# Patient Record
Sex: Female | Born: 1950 | Hispanic: No | Marital: Married | Smoking: Former smoker
Health system: Southern US, Community
[De-identification: ages and names within clinical notes are randomized; demographics above are authoritative.]

## PROBLEM LIST (undated history)

## (undated) DIAGNOSIS — F0390 Unspecified dementia without behavioral disturbance: Secondary | ICD-10-CM

## (undated) DIAGNOSIS — E785 Hyperlipidemia, unspecified: Secondary | ICD-10-CM

## (undated) HISTORY — DX: Hyperlipidemia, unspecified: E78.5

## (undated) HISTORY — PX: CATARACT EXTRACTION, BILATERAL: SHX1313

## (undated) HISTORY — DX: Unspecified dementia, unspecified severity, without behavioral disturbance, psychotic disturbance, mood disturbance, and anxiety: F03.90

---

## 2013-09-22 DIAGNOSIS — A498 Other bacterial infections of unspecified site: Secondary | ICD-10-CM | POA: Diagnosis present

## 2013-09-22 DIAGNOSIS — J9819 Other pulmonary collapse: Secondary | ICD-10-CM | POA: Diagnosis not present

## 2013-09-22 DIAGNOSIS — R509 Fever, unspecified: Secondary | ICD-10-CM | POA: Diagnosis not present

## 2013-09-22 DIAGNOSIS — R220 Localized swelling, mass and lump, head: Secondary | ICD-10-CM | POA: Diagnosis not present

## 2013-09-22 DIAGNOSIS — F17200 Nicotine dependence, unspecified, uncomplicated: Secondary | ICD-10-CM | POA: Diagnosis present

## 2013-09-22 DIAGNOSIS — G40909 Epilepsy, unspecified, not intractable, without status epilepticus: Secondary | ICD-10-CM | POA: Diagnosis present

## 2013-09-22 DIAGNOSIS — I672 Cerebral atherosclerosis: Secondary | ICD-10-CM | POA: Diagnosis present

## 2013-09-22 DIAGNOSIS — I6789 Other cerebrovascular disease: Secondary | ICD-10-CM | POA: Diagnosis not present

## 2013-09-22 DIAGNOSIS — J690 Pneumonitis due to inhalation of food and vomit: Secondary | ICD-10-CM | POA: Diagnosis present

## 2013-09-22 DIAGNOSIS — G9341 Metabolic encephalopathy: Secondary | ICD-10-CM | POA: Diagnosis present

## 2013-09-22 DIAGNOSIS — R569 Unspecified convulsions: Secondary | ICD-10-CM | POA: Diagnosis not present

## 2013-09-22 DIAGNOSIS — N390 Urinary tract infection, site not specified: Secondary | ICD-10-CM | POA: Diagnosis not present

## 2013-09-22 DIAGNOSIS — R7309 Other abnormal glucose: Secondary | ICD-10-CM | POA: Diagnosis present

## 2013-09-22 DIAGNOSIS — F0150 Vascular dementia without behavioral disturbance: Secondary | ICD-10-CM | POA: Diagnosis not present

## 2013-09-22 DIAGNOSIS — I6350 Cerebral infarction due to unspecified occlusion or stenosis of unspecified cerebral artery: Secondary | ICD-10-CM | POA: Diagnosis not present

## 2013-09-22 DIAGNOSIS — R918 Other nonspecific abnormal finding of lung field: Secondary | ICD-10-CM | POA: Diagnosis not present

## 2013-09-22 DIAGNOSIS — E872 Acidosis, unspecified: Secondary | ICD-10-CM | POA: Diagnosis present

## 2013-09-22 DIAGNOSIS — G9389 Other specified disorders of brain: Secondary | ICD-10-CM | POA: Diagnosis present

## 2013-10-02 DIAGNOSIS — G4700 Insomnia, unspecified: Secondary | ICD-10-CM | POA: Diagnosis not present

## 2013-10-02 DIAGNOSIS — Z9181 History of falling: Secondary | ICD-10-CM | POA: Diagnosis not present

## 2013-10-02 DIAGNOSIS — Z8701 Personal history of pneumonia (recurrent): Secondary | ICD-10-CM | POA: Diagnosis not present

## 2013-10-02 DIAGNOSIS — Z87440 Personal history of urinary (tract) infections: Secondary | ICD-10-CM | POA: Diagnosis not present

## 2013-10-02 DIAGNOSIS — R569 Unspecified convulsions: Secondary | ICD-10-CM | POA: Diagnosis not present

## 2013-10-02 DIAGNOSIS — Z09 Encounter for follow-up examination after completed treatment for conditions other than malignant neoplasm: Secondary | ICD-10-CM | POA: Diagnosis not present

## 2013-10-02 DIAGNOSIS — G35 Multiple sclerosis: Secondary | ICD-10-CM | POA: Diagnosis not present

## 2013-10-02 DIAGNOSIS — G40909 Epilepsy, unspecified, not intractable, without status epilepticus: Secondary | ICD-10-CM | POA: Diagnosis not present

## 2013-10-02 DIAGNOSIS — F0390 Unspecified dementia without behavioral disturbance: Secondary | ICD-10-CM | POA: Diagnosis not present

## 2013-10-02 DIAGNOSIS — Z87891 Personal history of nicotine dependence: Secondary | ICD-10-CM | POA: Diagnosis not present

## 2013-10-08 DIAGNOSIS — G40909 Epilepsy, unspecified, not intractable, without status epilepticus: Secondary | ICD-10-CM | POA: Diagnosis not present

## 2013-10-08 DIAGNOSIS — F0390 Unspecified dementia without behavioral disturbance: Secondary | ICD-10-CM | POA: Diagnosis not present

## 2013-10-08 DIAGNOSIS — F17200 Nicotine dependence, unspecified, uncomplicated: Secondary | ICD-10-CM | POA: Diagnosis not present

## 2013-10-08 DIAGNOSIS — R32 Unspecified urinary incontinence: Secondary | ICD-10-CM | POA: Diagnosis not present

## 2013-10-11 DIAGNOSIS — F3289 Other specified depressive episodes: Secondary | ICD-10-CM | POA: Diagnosis not present

## 2013-10-11 DIAGNOSIS — F0390 Unspecified dementia without behavioral disturbance: Secondary | ICD-10-CM | POA: Diagnosis not present

## 2013-10-11 DIAGNOSIS — G40909 Epilepsy, unspecified, not intractable, without status epilepticus: Secondary | ICD-10-CM | POA: Diagnosis not present

## 2013-10-11 DIAGNOSIS — F17200 Nicotine dependence, unspecified, uncomplicated: Secondary | ICD-10-CM | POA: Diagnosis not present

## 2013-10-11 DIAGNOSIS — E785 Hyperlipidemia, unspecified: Secondary | ICD-10-CM | POA: Diagnosis not present

## 2013-10-12 DIAGNOSIS — F3289 Other specified depressive episodes: Secondary | ICD-10-CM | POA: Diagnosis not present

## 2013-10-12 DIAGNOSIS — G40909 Epilepsy, unspecified, not intractable, without status epilepticus: Secondary | ICD-10-CM | POA: Diagnosis not present

## 2013-10-12 DIAGNOSIS — F0390 Unspecified dementia without behavioral disturbance: Secondary | ICD-10-CM | POA: Diagnosis not present

## 2013-10-12 DIAGNOSIS — F17200 Nicotine dependence, unspecified, uncomplicated: Secondary | ICD-10-CM | POA: Diagnosis not present

## 2013-10-12 DIAGNOSIS — E785 Hyperlipidemia, unspecified: Secondary | ICD-10-CM | POA: Diagnosis not present

## 2013-10-15 DIAGNOSIS — F0390 Unspecified dementia without behavioral disturbance: Secondary | ICD-10-CM | POA: Diagnosis not present

## 2013-10-15 DIAGNOSIS — F17200 Nicotine dependence, unspecified, uncomplicated: Secondary | ICD-10-CM | POA: Diagnosis not present

## 2013-10-15 DIAGNOSIS — F3289 Other specified depressive episodes: Secondary | ICD-10-CM | POA: Diagnosis not present

## 2013-10-15 DIAGNOSIS — G40909 Epilepsy, unspecified, not intractable, without status epilepticus: Secondary | ICD-10-CM | POA: Diagnosis not present

## 2013-10-15 DIAGNOSIS — E785 Hyperlipidemia, unspecified: Secondary | ICD-10-CM | POA: Diagnosis not present

## 2013-10-18 DIAGNOSIS — F3289 Other specified depressive episodes: Secondary | ICD-10-CM | POA: Diagnosis not present

## 2013-10-18 DIAGNOSIS — G40909 Epilepsy, unspecified, not intractable, without status epilepticus: Secondary | ICD-10-CM | POA: Diagnosis not present

## 2013-10-18 DIAGNOSIS — E785 Hyperlipidemia, unspecified: Secondary | ICD-10-CM | POA: Diagnosis not present

## 2013-10-18 DIAGNOSIS — F0390 Unspecified dementia without behavioral disturbance: Secondary | ICD-10-CM | POA: Diagnosis not present

## 2013-10-18 DIAGNOSIS — F17200 Nicotine dependence, unspecified, uncomplicated: Secondary | ICD-10-CM | POA: Diagnosis not present

## 2013-10-23 DIAGNOSIS — F3289 Other specified depressive episodes: Secondary | ICD-10-CM | POA: Diagnosis not present

## 2013-10-23 DIAGNOSIS — F17200 Nicotine dependence, unspecified, uncomplicated: Secondary | ICD-10-CM | POA: Diagnosis not present

## 2013-10-23 DIAGNOSIS — F0390 Unspecified dementia without behavioral disturbance: Secondary | ICD-10-CM | POA: Diagnosis not present

## 2013-10-23 DIAGNOSIS — G40909 Epilepsy, unspecified, not intractable, without status epilepticus: Secondary | ICD-10-CM | POA: Diagnosis not present

## 2013-10-23 DIAGNOSIS — E785 Hyperlipidemia, unspecified: Secondary | ICD-10-CM | POA: Diagnosis not present

## 2013-10-26 DIAGNOSIS — F0390 Unspecified dementia without behavioral disturbance: Secondary | ICD-10-CM | POA: Diagnosis not present

## 2013-10-26 DIAGNOSIS — F3289 Other specified depressive episodes: Secondary | ICD-10-CM | POA: Diagnosis not present

## 2013-10-26 DIAGNOSIS — E785 Hyperlipidemia, unspecified: Secondary | ICD-10-CM | POA: Diagnosis not present

## 2013-10-26 DIAGNOSIS — G40909 Epilepsy, unspecified, not intractable, without status epilepticus: Secondary | ICD-10-CM | POA: Diagnosis not present

## 2013-10-26 DIAGNOSIS — F17200 Nicotine dependence, unspecified, uncomplicated: Secondary | ICD-10-CM | POA: Diagnosis not present

## 2013-11-13 DIAGNOSIS — G40909 Epilepsy, unspecified, not intractable, without status epilepticus: Secondary | ICD-10-CM | POA: Diagnosis not present

## 2014-06-20 DIAGNOSIS — R569 Unspecified convulsions: Secondary | ICD-10-CM | POA: Diagnosis not present

## 2014-06-20 DIAGNOSIS — H538 Other visual disturbances: Secondary | ICD-10-CM | POA: Diagnosis not present

## 2014-06-20 DIAGNOSIS — R7309 Other abnormal glucose: Secondary | ICD-10-CM | POA: Diagnosis not present

## 2014-06-20 DIAGNOSIS — Z0000 Encounter for general adult medical examination without abnormal findings: Secondary | ICD-10-CM | POA: Diagnosis not present

## 2014-06-20 DIAGNOSIS — Z01118 Encounter for examination of ears and hearing with other abnormal findings: Secondary | ICD-10-CM | POA: Diagnosis not present

## 2014-06-20 DIAGNOSIS — Z1389 Encounter for screening for other disorder: Secondary | ICD-10-CM | POA: Diagnosis not present

## 2014-06-20 DIAGNOSIS — N3281 Overactive bladder: Secondary | ICD-10-CM | POA: Diagnosis not present

## 2014-06-20 DIAGNOSIS — E559 Vitamin D deficiency, unspecified: Secondary | ICD-10-CM | POA: Diagnosis not present

## 2014-06-20 DIAGNOSIS — R069 Unspecified abnormalities of breathing: Secondary | ICD-10-CM | POA: Diagnosis not present

## 2014-06-20 DIAGNOSIS — H5319 Other subjective visual disturbances: Secondary | ICD-10-CM | POA: Diagnosis not present

## 2014-06-20 DIAGNOSIS — Z720 Tobacco use: Secondary | ICD-10-CM | POA: Diagnosis not present

## 2014-06-20 DIAGNOSIS — E785 Hyperlipidemia, unspecified: Secondary | ICD-10-CM | POA: Diagnosis not present

## 2014-06-20 DIAGNOSIS — Z23 Encounter for immunization: Secondary | ICD-10-CM | POA: Diagnosis not present

## 2014-07-04 DIAGNOSIS — E559 Vitamin D deficiency, unspecified: Secondary | ICD-10-CM | POA: Diagnosis not present

## 2014-07-04 DIAGNOSIS — R569 Unspecified convulsions: Secondary | ICD-10-CM | POA: Diagnosis not present

## 2014-07-04 DIAGNOSIS — R069 Unspecified abnormalities of breathing: Secondary | ICD-10-CM | POA: Diagnosis not present

## 2014-07-04 DIAGNOSIS — N3281 Overactive bladder: Secondary | ICD-10-CM | POA: Diagnosis not present

## 2014-07-04 DIAGNOSIS — R7309 Other abnormal glucose: Secondary | ICD-10-CM | POA: Diagnosis not present

## 2014-07-04 DIAGNOSIS — Z720 Tobacco use: Secondary | ICD-10-CM | POA: Diagnosis not present

## 2014-07-04 DIAGNOSIS — E785 Hyperlipidemia, unspecified: Secondary | ICD-10-CM | POA: Diagnosis not present

## 2014-09-20 DIAGNOSIS — R7309 Other abnormal glucose: Secondary | ICD-10-CM | POA: Diagnosis not present

## 2014-09-20 DIAGNOSIS — E785 Hyperlipidemia, unspecified: Secondary | ICD-10-CM | POA: Diagnosis not present

## 2014-09-20 DIAGNOSIS — E559 Vitamin D deficiency, unspecified: Secondary | ICD-10-CM | POA: Diagnosis not present

## 2014-09-20 DIAGNOSIS — R569 Unspecified convulsions: Secondary | ICD-10-CM | POA: Diagnosis not present

## 2014-09-20 DIAGNOSIS — N3281 Overactive bladder: Secondary | ICD-10-CM | POA: Diagnosis not present

## 2014-09-20 DIAGNOSIS — Z720 Tobacco use: Secondary | ICD-10-CM | POA: Diagnosis not present

## 2015-08-18 DIAGNOSIS — H2513 Age-related nuclear cataract, bilateral: Secondary | ICD-10-CM | POA: Diagnosis not present

## 2015-08-18 DIAGNOSIS — H40033 Anatomical narrow angle, bilateral: Secondary | ICD-10-CM | POA: Diagnosis not present

## 2015-10-13 DIAGNOSIS — E559 Vitamin D deficiency, unspecified: Secondary | ICD-10-CM | POA: Diagnosis not present

## 2015-10-13 DIAGNOSIS — E785 Hyperlipidemia, unspecified: Secondary | ICD-10-CM | POA: Diagnosis not present

## 2015-10-15 DIAGNOSIS — E785 Hyperlipidemia, unspecified: Secondary | ICD-10-CM | POA: Diagnosis not present

## 2015-10-15 DIAGNOSIS — E559 Vitamin D deficiency, unspecified: Secondary | ICD-10-CM | POA: Diagnosis not present

## 2015-11-17 DIAGNOSIS — H40033 Anatomical narrow angle, bilateral: Secondary | ICD-10-CM | POA: Diagnosis not present

## 2015-11-17 DIAGNOSIS — H25812 Combined forms of age-related cataract, left eye: Secondary | ICD-10-CM | POA: Diagnosis not present

## 2015-11-17 DIAGNOSIS — G35 Multiple sclerosis: Secondary | ICD-10-CM | POA: Diagnosis not present

## 2015-11-17 DIAGNOSIS — H2589 Other age-related cataract: Secondary | ICD-10-CM | POA: Diagnosis not present

## 2015-11-21 ENCOUNTER — Other Ambulatory Visit (HOSPITAL_COMMUNITY): Payer: Self-pay | Admitting: Ophthalmology

## 2015-11-21 ENCOUNTER — Other Ambulatory Visit: Payer: Self-pay | Admitting: Ophthalmology

## 2015-11-21 DIAGNOSIS — G35 Multiple sclerosis: Secondary | ICD-10-CM

## 2015-11-21 DIAGNOSIS — H469 Unspecified optic neuritis: Secondary | ICD-10-CM

## 2015-12-01 ENCOUNTER — Ambulatory Visit (HOSPITAL_COMMUNITY): Admission: RE | Admit: 2015-12-01 | Payer: Medicaid Other | Source: Ambulatory Visit

## 2015-12-04 DIAGNOSIS — E559 Vitamin D deficiency, unspecified: Secondary | ICD-10-CM | POA: Diagnosis not present

## 2015-12-04 DIAGNOSIS — E785 Hyperlipidemia, unspecified: Secondary | ICD-10-CM | POA: Diagnosis not present

## 2015-12-18 DIAGNOSIS — E559 Vitamin D deficiency, unspecified: Secondary | ICD-10-CM | POA: Diagnosis not present

## 2015-12-18 DIAGNOSIS — E785 Hyperlipidemia, unspecified: Secondary | ICD-10-CM | POA: Diagnosis not present

## 2015-12-22 DIAGNOSIS — H25812 Combined forms of age-related cataract, left eye: Secondary | ICD-10-CM | POA: Diagnosis not present

## 2015-12-22 DIAGNOSIS — H2512 Age-related nuclear cataract, left eye: Secondary | ICD-10-CM | POA: Diagnosis not present

## 2016-01-15 DIAGNOSIS — E785 Hyperlipidemia, unspecified: Secondary | ICD-10-CM | POA: Diagnosis not present

## 2016-01-15 DIAGNOSIS — E559 Vitamin D deficiency, unspecified: Secondary | ICD-10-CM | POA: Diagnosis not present

## 2016-03-05 DIAGNOSIS — E785 Hyperlipidemia, unspecified: Secondary | ICD-10-CM | POA: Diagnosis not present

## 2016-03-05 DIAGNOSIS — E559 Vitamin D deficiency, unspecified: Secondary | ICD-10-CM | POA: Diagnosis not present

## 2016-03-30 DIAGNOSIS — E559 Vitamin D deficiency, unspecified: Secondary | ICD-10-CM | POA: Diagnosis not present

## 2016-03-30 DIAGNOSIS — E785 Hyperlipidemia, unspecified: Secondary | ICD-10-CM | POA: Diagnosis not present

## 2016-05-14 DIAGNOSIS — E785 Hyperlipidemia, unspecified: Secondary | ICD-10-CM | POA: Diagnosis not present

## 2016-05-14 DIAGNOSIS — E559 Vitamin D deficiency, unspecified: Secondary | ICD-10-CM | POA: Diagnosis not present

## 2016-06-11 DIAGNOSIS — E559 Vitamin D deficiency, unspecified: Secondary | ICD-10-CM | POA: Diagnosis not present

## 2016-06-11 DIAGNOSIS — E785 Hyperlipidemia, unspecified: Secondary | ICD-10-CM | POA: Diagnosis not present

## 2016-07-07 DIAGNOSIS — E559 Vitamin D deficiency, unspecified: Secondary | ICD-10-CM | POA: Diagnosis not present

## 2016-07-07 DIAGNOSIS — E785 Hyperlipidemia, unspecified: Secondary | ICD-10-CM | POA: Diagnosis not present

## 2016-08-05 DIAGNOSIS — E559 Vitamin D deficiency, unspecified: Secondary | ICD-10-CM | POA: Diagnosis not present

## 2016-08-05 DIAGNOSIS — E785 Hyperlipidemia, unspecified: Secondary | ICD-10-CM | POA: Diagnosis not present

## 2016-08-23 DIAGNOSIS — Z23 Encounter for immunization: Secondary | ICD-10-CM | POA: Diagnosis not present

## 2016-08-23 DIAGNOSIS — Z720 Tobacco use: Secondary | ICD-10-CM | POA: Diagnosis not present

## 2016-08-23 DIAGNOSIS — R569 Unspecified convulsions: Secondary | ICD-10-CM | POA: Diagnosis not present

## 2016-08-23 DIAGNOSIS — N3281 Overactive bladder: Secondary | ICD-10-CM | POA: Diagnosis not present

## 2016-08-23 DIAGNOSIS — Z131 Encounter for screening for diabetes mellitus: Secondary | ICD-10-CM | POA: Diagnosis not present

## 2016-08-23 DIAGNOSIS — N390 Urinary tract infection, site not specified: Secondary | ICD-10-CM | POA: Diagnosis not present

## 2016-08-23 DIAGNOSIS — E559 Vitamin D deficiency, unspecified: Secondary | ICD-10-CM | POA: Diagnosis not present

## 2016-08-23 DIAGNOSIS — E785 Hyperlipidemia, unspecified: Secondary | ICD-10-CM | POA: Diagnosis not present

## 2016-08-23 DIAGNOSIS — R7301 Impaired fasting glucose: Secondary | ICD-10-CM | POA: Diagnosis not present

## 2016-08-23 DIAGNOSIS — Z0110 Encounter for examination of ears and hearing without abnormal findings: Secondary | ICD-10-CM | POA: Diagnosis not present

## 2016-08-23 DIAGNOSIS — H539 Unspecified visual disturbance: Secondary | ICD-10-CM | POA: Diagnosis not present

## 2016-08-23 DIAGNOSIS — R7303 Prediabetes: Secondary | ICD-10-CM | POA: Diagnosis not present

## 2016-09-13 DIAGNOSIS — N3281 Overactive bladder: Secondary | ICD-10-CM | POA: Diagnosis not present

## 2016-09-13 DIAGNOSIS — E559 Vitamin D deficiency, unspecified: Secondary | ICD-10-CM | POA: Diagnosis not present

## 2016-09-13 DIAGNOSIS — R7303 Prediabetes: Secondary | ICD-10-CM | POA: Diagnosis not present

## 2016-09-13 DIAGNOSIS — R569 Unspecified convulsions: Secondary | ICD-10-CM | POA: Diagnosis not present

## 2016-09-13 DIAGNOSIS — G35 Multiple sclerosis: Secondary | ICD-10-CM | POA: Diagnosis not present

## 2016-09-13 DIAGNOSIS — E785 Hyperlipidemia, unspecified: Secondary | ICD-10-CM | POA: Diagnosis not present

## 2016-09-13 DIAGNOSIS — R030 Elevated blood-pressure reading, without diagnosis of hypertension: Secondary | ICD-10-CM | POA: Diagnosis not present

## 2016-09-13 DIAGNOSIS — Z720 Tobacco use: Secondary | ICD-10-CM | POA: Diagnosis not present

## 2016-10-01 DIAGNOSIS — E559 Vitamin D deficiency, unspecified: Secondary | ICD-10-CM | POA: Diagnosis not present

## 2016-10-01 DIAGNOSIS — G35 Multiple sclerosis: Secondary | ICD-10-CM | POA: Diagnosis not present

## 2016-10-01 DIAGNOSIS — E785 Hyperlipidemia, unspecified: Secondary | ICD-10-CM | POA: Diagnosis not present

## 2016-10-01 DIAGNOSIS — R030 Elevated blood-pressure reading, without diagnosis of hypertension: Secondary | ICD-10-CM | POA: Diagnosis not present

## 2016-10-01 DIAGNOSIS — N3281 Overactive bladder: Secondary | ICD-10-CM | POA: Diagnosis not present

## 2016-10-01 DIAGNOSIS — R569 Unspecified convulsions: Secondary | ICD-10-CM | POA: Diagnosis not present

## 2016-10-01 DIAGNOSIS — R7303 Prediabetes: Secondary | ICD-10-CM | POA: Diagnosis not present

## 2016-10-01 DIAGNOSIS — R413 Other amnesia: Secondary | ICD-10-CM | POA: Diagnosis not present

## 2016-10-01 DIAGNOSIS — Z720 Tobacco use: Secondary | ICD-10-CM | POA: Diagnosis not present

## 2016-11-01 DIAGNOSIS — F0390 Unspecified dementia without behavioral disturbance: Secondary | ICD-10-CM | POA: Diagnosis not present

## 2016-11-01 DIAGNOSIS — G40009 Localization-related (focal) (partial) idiopathic epilepsy and epileptic syndromes with seizures of localized onset, not intractable, without status epilepticus: Secondary | ICD-10-CM | POA: Diagnosis not present

## 2016-11-16 DIAGNOSIS — N3281 Overactive bladder: Secondary | ICD-10-CM | POA: Diagnosis not present

## 2016-11-16 DIAGNOSIS — Z0000 Encounter for general adult medical examination without abnormal findings: Secondary | ICD-10-CM | POA: Diagnosis not present

## 2016-11-16 DIAGNOSIS — R413 Other amnesia: Secondary | ICD-10-CM | POA: Diagnosis not present

## 2016-11-16 DIAGNOSIS — Z136 Encounter for screening for cardiovascular disorders: Secondary | ICD-10-CM | POA: Diagnosis not present

## 2016-11-16 DIAGNOSIS — E785 Hyperlipidemia, unspecified: Secondary | ICD-10-CM | POA: Diagnosis not present

## 2016-11-16 DIAGNOSIS — Z720 Tobacco use: Secondary | ICD-10-CM | POA: Diagnosis not present

## 2016-11-16 DIAGNOSIS — G35 Multiple sclerosis: Secondary | ICD-10-CM | POA: Diagnosis not present

## 2016-11-16 DIAGNOSIS — R569 Unspecified convulsions: Secondary | ICD-10-CM | POA: Diagnosis not present

## 2016-11-16 DIAGNOSIS — Z131 Encounter for screening for diabetes mellitus: Secondary | ICD-10-CM | POA: Diagnosis not present

## 2016-11-16 DIAGNOSIS — E559 Vitamin D deficiency, unspecified: Secondary | ICD-10-CM | POA: Diagnosis not present

## 2016-11-16 DIAGNOSIS — R7303 Prediabetes: Secondary | ICD-10-CM | POA: Diagnosis not present

## 2017-04-15 ENCOUNTER — Ambulatory Visit (INDEPENDENT_AMBULATORY_CARE_PROVIDER_SITE_OTHER): Payer: Medicare Other | Admitting: Family Medicine

## 2017-04-15 ENCOUNTER — Encounter: Payer: Self-pay | Admitting: Family Medicine

## 2017-04-15 VITALS — BP 124/70 | HR 57 | Temp 98.6°F | Wt 138.0 lb

## 2017-04-15 DIAGNOSIS — R7309 Other abnormal glucose: Secondary | ICD-10-CM | POA: Diagnosis not present

## 2017-04-15 DIAGNOSIS — Z7689 Persons encountering health services in other specified circumstances: Secondary | ICD-10-CM

## 2017-04-15 DIAGNOSIS — Z130 Encounter for screening for diseases of the blood and blood-forming organs and certain disorders involving the immune mechanism: Secondary | ICD-10-CM | POA: Diagnosis not present

## 2017-04-15 LAB — POCT GLYCOSYLATED HEMOGLOBIN (HGB A1C): Hemoglobin A1C: 5.3

## 2017-04-15 NOTE — Progress Notes (Signed)
   Subjective:    Patient ID: Christina Russell, female    DOB: 1951/07/07, 66 y.o.   MRN: [id number]   CC: Establishing care with new PCP  HPI: Patient is a 66 year old female with a past medical history significant for alcohol and drug abuse who presented today to establish care. Patient currently has no acute concerns, doesn't take any medication. Patient moved from [place] to [place] 3 years ago and has recently moved in with her daughter. She helps take care of her husband. Patient reports diagnosis of lupus with prior neurology consult. Patient used to be a heavy smoker but has quit since April 2018. Patient denies drinking or using drugs in the past few years Patient currently denies any chest pain, shortness of breath, abdominal pain, headache, dizziness, vision changes, nausea, vomiting.  Smoking status reviewed   ROS: all other systems were reviewed and are negative other than in the HPI   No past medical history on file.  No past surgical history on file.   Objective:  BP 124/70   Pulse (!) 57   Temp 98.6 F (37 C) (Oral)   Wt 138 lb (62.6 kg)   SpO2 98%   Vitals and nursing note reviewed  General: NAD, pleasant, able to participate in exam Cardiac: RRR, normal heart sounds, no murmurs. 2+ radial and PT pulses bilaterally Respiratory: CTAB, normal effort, No wheezes, rales or rhonchi Abdomen: soft, nontender, nondistended, no hepatic or splenomegaly, +BS Extremities: no edema or cyanosis. WWP. Skin: warm and dry, no rashes noted Neuro: alert and oriented x4, no focal deficits Psych: Normal affect and mood   Assessment & Plan:   #Establishing care with new PCP Patient presents today with no acute complaints. She currently not taking any medications. Patient has a history of substance abuse. Reports intermittent polyuria and polydipsia. Will order basic lab work and follow up in two weeks. --Order CBC with diff, CMP and Hbg A1c --FOBT card provided today --Will  recommend flu and pneumonia vaccine at next OV   Lovena Neighbours, MD [facility] PGY-2

## 2017-04-16 LAB — CBC WITH DIFFERENTIAL/PLATELET
BASOS: 1 %
Basophils Absolute: 0 10*3/uL (ref 0.0–0.2)
EOS (ABSOLUTE): 0.1 10*3/uL (ref 0.0–0.4)
EOS: 2 %
HEMATOCRIT: 40.9 % (ref 34.0–46.6)
Hemoglobin: 13.2 g/dL (ref 11.1–15.9)
IMMATURE GRANS (ABS): 0 10*3/uL (ref 0.0–0.1)
Immature Granulocytes: 0 %
Lymphocytes Absolute: 2.4 10*3/uL (ref 0.7–3.1)
Lymphs: 45 %
MCH: 30.2 pg (ref 26.6–33.0)
MCHC: 32.3 g/dL (ref 31.5–35.7)
MCV: 94 fL (ref 79–97)
MONOS ABS: 0.4 10*3/uL (ref 0.1–0.9)
Monocytes: 8 %
NEUTROS ABS: 2.3 10*3/uL (ref 1.4–7.0)
NEUTROS PCT: 44 %
Platelets: 202 10*3/uL (ref 150–379)
RBC: 4.37 x10E6/uL (ref 3.77–5.28)
RDW: 13.8 % (ref 12.3–15.4)
WBC: 5.3 10*3/uL (ref 3.4–10.8)

## 2017-04-16 LAB — CMP14+EGFR
A/G RATIO: 1.4 (ref 1.2–2.2)
ALT: 8 IU/L (ref 0–32)
AST: 15 IU/L (ref 0–40)
Albumin: 4.4 g/dL (ref 3.6–4.8)
Alkaline Phosphatase: 84 IU/L (ref 39–117)
BILIRUBIN TOTAL: 0.2 mg/dL (ref 0.0–1.2)
BUN / CREAT RATIO: 18 (ref 12–28)
BUN: 13 mg/dL (ref 8–27)
CALCIUM: 9.7 mg/dL (ref 8.7–10.3)
CHLORIDE: 102 mmol/L (ref 96–106)
CO2: 24 mmol/L (ref 20–29)
Creatinine, Ser: 0.74 mg/dL (ref 0.57–1.00)
GFR, EST AFRICAN AMERICAN: 97 mL/min/{1.73_m2} (ref >59)
GFR, EST NON AFRICAN AMERICAN: 84 mL/min/{1.73_m2} (ref >59)
GLOBULIN, TOTAL: 3.1 g/dL (ref 1.5–4.5)
Glucose: 88 mg/dL (ref 65–99)
Potassium: 4.8 mmol/L (ref 3.5–5.2)
Sodium: 139 mmol/L (ref 134–144)
TOTAL PROTEIN: 7.5 g/dL (ref 6.0–8.5)

## 2017-05-25 ENCOUNTER — Ambulatory Visit: Payer: Medicare Other | Admitting: Family Medicine

## 2017-06-14 ENCOUNTER — Other Ambulatory Visit: Payer: Self-pay | Admitting: Family Medicine

## 2017-08-24 ENCOUNTER — Other Ambulatory Visit: Payer: Self-pay | Admitting: Family Medicine

## 2017-11-29 ENCOUNTER — Encounter: Payer: Self-pay | Admitting: Family Medicine

## 2017-11-29 ENCOUNTER — Other Ambulatory Visit: Payer: Self-pay

## 2017-11-29 ENCOUNTER — Ambulatory Visit (INDEPENDENT_AMBULATORY_CARE_PROVIDER_SITE_OTHER): Payer: Medicare Other | Admitting: Family Medicine

## 2017-11-29 VITALS — BP 122/62 | HR 91 | Temp 99.0°F | Wt 145.0 lb

## 2017-11-29 DIAGNOSIS — M65332 Trigger finger, left middle finger: Secondary | ICD-10-CM | POA: Diagnosis not present

## 2017-11-29 DIAGNOSIS — R350 Frequency of micturition: Secondary | ICD-10-CM | POA: Diagnosis not present

## 2017-11-29 DIAGNOSIS — R569 Unspecified convulsions: Secondary | ICD-10-CM | POA: Insufficient documentation

## 2017-11-29 LAB — POCT URINALYSIS DIP (MANUAL ENTRY)
Bilirubin, UA: NEGATIVE
Glucose, UA: NEGATIVE mg/dL
Ketones, POC UA: NEGATIVE mg/dL
Nitrite, UA: POSITIVE — AB
PH UA: 6.5 (ref 5.0–8.0)
PROTEIN UA: NEGATIVE mg/dL
Spec Grav, UA: 1.01 (ref 1.010–1.025)
UROBILINOGEN UA: 0.2 U/dL

## 2017-11-29 LAB — POCT UA - MICROSCOPIC ONLY

## 2017-11-29 MED ORDER — CEPHALEXIN 500 MG PO CAPS: 500.0000 mg | ORAL_CAPSULE | Freq: Four times a day (QID) | ORAL | 0 refills | Status: AC

## 2017-11-29 NOTE — Patient Instructions (Signed)
It was great seeing you today! We have addressed the following issues today  1. You have a urinary tract infection I prescribed an antibiotic for you sent to your pharmacy take it as prescribed 2. I referred you to sports medicine for your trigger finger 3. I referred you Neurology for your history of seizures  If we did any lab work today, and the results require attention, either me or my nurse will get in touch with you. If everything is normal, you will get a letter in mail and a message via . If you don't hear from [place] in two weeks, please give [place] a call. Otherwise, we look forward to seeing you again at your next visit. If you have any questions or concerns before then, please call the clinic at [phone].  Please bring all your medications to every doctors visit  Sign up for My Chart to have easy access to your labs results, and communication with your Primary care physician. Please ask Front Desk for some assistance.   Please check-out at the front desk before leaving the clinic.    Take Care,   Dr. Sydnee Cabal

## 2017-11-30 DIAGNOSIS — R350 Frequency of micturition: Secondary | ICD-10-CM | POA: Insufficient documentation

## 2017-11-30 DIAGNOSIS — M65332 Trigger finger, left middle finger: Secondary | ICD-10-CM | POA: Insufficient documentation

## 2017-11-30 NOTE — Assessment & Plan Note (Signed)
Patient reports left hand middle finger getting stuck with flexion for the past few months.  Patient denies any history of trigger finger.  Based on description will refer to sports medicine for possible steroid injection. --Referral to outpatient sports medicine

## 2017-11-30 NOTE — Progress Notes (Signed)
   Subjective:    Patient ID: Christina Russell, female    DOB: 1951/01/13, 67 y.o.   MRN: [id number]   CC: Increased urinary frequency  HPI: Patient is a 67 year old female who presents today complaining of increased urinary frequency.  Patient reports that symptoms have been occurring for the past few months.  She denies any dysuria, hesitancy, nausea, vomiting, back pain.  Patient daughter was concerned about symptoms and was trying to decide if she needs a urology consult for incontinence.  Patient unable to remember last urinary tract infection.  Patient reports that she has been going as many as 20 times a day.  Patient denies malodorous urine.  Smoking status reviewed   ROS: all other systems were reviewed and are negative other than in the HPI   No past medical history on file.   Past medical history, surgical, family, and social history reviewed and updated in the EMR as appropriate.  Objective:  BP 122/62   Pulse 91   Temp 99 F (37.2 C) (Oral)   Wt 145 lb (65.8 kg)   SpO2 97%   Vitals and nursing note reviewed  General: NAD, pleasant, able to participate in exam Cardiac: RRR, normal heart sounds, no murmurs. 2+ radial and PT pulses bilaterally Respiratory: CTAB, normal effort, No wheezes, rales or rhonchi Abdomen: soft, nontender, nondistended, no hepatic or splenomegaly, +BS GU: Deferred Extremities: no edema or cyanosis. WWP. Skin: warm and dry, no rashes noted Neuro: alert and oriented x4, no focal deficits Psych: Normal affect and mood   Assessment & Plan:    Urinary frequency Patient presents with increased recent frequency for the past few months.  No dysuria or hesitancy.  Presenting symptoms concerning for cystitis.  UA showed moderate leukocytes was positive for nitrite consistent with UTI diagnosis.  UA also showed moderate blood likely secondary to irritation in the setting of prolonged symptoms. --Prescribe Keflex 500 mg daily for 7 days --ReFlex  microscopy and urine culture --Repeat UA after completion of treatment but still present patient may need urology referral for further evaluation.  Seizures (HCC) Patient has a history of seizures according to daughter.  She is to have multiple episodes in the past, however daughter denies any seizure episodes in the past 3 years since the most of her.  Daughter mention that mom had MRI which showed lesions that need to be followed up.  She is requesting neurology referral for further evaluation.  Patient is currently not taking any anti-seizure medication.   --Referral placed for outpatient  neurology --We will follow-up  Trigger middle finger of left hand Patient reports left hand middle finger getting stuck with flexion for the past few months.  Patient denies any history of trigger finger.  Based on description will refer to sports medicine for possible steroid injection. --Referral to outpatient sports medicine    Lovena Neighbours, MD [facility] PGY-2

## 2017-11-30 NOTE — Assessment & Plan Note (Signed)
Patient has a history of seizures according to daughter.  She is to have multiple episodes in the past, however daughter denies any seizure episodes in the past 3 years since the most of her.  Daughter mention that mom had MRI which showed lesions that need to be followed up.  She is requesting neurology referral for further evaluation.  Patient is currently not taking any anti-seizure medication.   --Referral placed for outpatient  neurology --We will follow-up

## 2017-11-30 NOTE — Assessment & Plan Note (Signed)
Patient presents with increased recent frequency for the past few months.  No dysuria or hesitancy.  Presenting symptoms concerning for cystitis.  UA showed moderate leukocytes was positive for nitrite consistent with UTI diagnosis.  UA also showed moderate blood likely secondary to irritation in the setting of prolonged symptoms. --Prescribe Keflex 500 mg daily for 7 days --ReFlex microscopy and urine culture --Repeat UA after completion of treatment but still present patient may need urology referral for further evaluation.

## 2017-12-05 ENCOUNTER — Encounter: Payer: Self-pay | Admitting: Neurology

## 2017-12-05 LAB — URINE CULTURE

## 2017-12-08 ENCOUNTER — Ambulatory Visit: Payer: Medicare Other | Admitting: Sports Medicine

## 2017-12-09 NOTE — Progress Notes (Unsigned)
   Subjective:    Patient ID: Christina Russell, female    DOB: 06-21-1951, 66 y.o.   MRN: [id number]  HPI    Review of Systems     Objective:   Physical Exam        Assessment & Plan:

## 2017-12-19 ENCOUNTER — Ambulatory Visit: Payer: Medicare Other | Admitting: Sports Medicine

## 2017-12-22 ENCOUNTER — Encounter: Payer: Self-pay | Admitting: Neurology

## 2017-12-22 ENCOUNTER — Ambulatory Visit (INDEPENDENT_AMBULATORY_CARE_PROVIDER_SITE_OTHER): Payer: Medicare Other | Admitting: Neurology

## 2017-12-22 ENCOUNTER — Other Ambulatory Visit: Payer: Self-pay

## 2017-12-22 VITALS — BP 108/66 | HR 71 | Ht 63.0 in | Wt 144.0 lb

## 2017-12-22 DIAGNOSIS — R9089 Other abnormal findings on diagnostic imaging of central nervous system: Secondary | ICD-10-CM

## 2017-12-22 DIAGNOSIS — Z87898 Personal history of other specified conditions: Secondary | ICD-10-CM

## 2017-12-22 DIAGNOSIS — F0390 Unspecified dementia without behavioral disturbance: Secondary | ICD-10-CM

## 2017-12-22 DIAGNOSIS — F03A0 Unspecified dementia, mild, without behavioral disturbance, psychotic disturbance, mood disturbance, and anxiety: Secondary | ICD-10-CM

## 2017-12-22 MED ORDER — DONEPEZIL HCL 10 MG PO TABS: ORAL_TABLET | ORAL | 11 refills | Status: DC

## 2017-12-22 NOTE — Patient Instructions (Addendum)
1. Schedule MRI brain with and without contrast  We have sent a referral to [facility] for your MRI and they will call you directly to schedule your appt. They are located at [address] [facility] If you need to contact them directly please call [phone].   2. Start Donepezil 10mg : Take 1/2 tablet daily for 2 weeks, then increase to 1 tablet daily 3. Recommend 24/7 supervision with medication management, meals 4. Follow-up in 6 months, call for any changes  FALL PRECAUTIONS: Be cautious when walking. Scan the area for obstacles that may increase the risk of trips and falls. When getting up in the mornings, sit up at the edge of the bed for a few minutes before getting out of bed. Consider elevating the bed at the head end to avoid drop of blood pressure when getting up. Walk always in a well-lit room (use night lights in the walls). Avoid area rugs or power cords from appliances in the middle of the walkways. Use a walker or a cane if necessary and consider physical therapy for balance exercise. Get your eyesight checked regularly.  FINANCIAL OVERSIGHT: Supervision, especially oversight when making financial decisions or transactions is also recommended.  HOME SAFETY: Consider the safety of the kitchen when operating appliances like stoves, microwave oven, and blender. Consider having supervision and share cooking responsibilities until no longer able to participate in those. Accidents with firearms and other hazards in the house should be identified and addressed as well.  DRIVING: Regarding driving, in patients with progressive memory problems, driving will be impaired. We advise to have someone else do the driving if trouble finding directions or if minor accidents are reported. Independent driving assessment is available to determine safety of driving.  ABILITY TO BE LEFT ALONE: If patient is unable to contact 911 operator, consider using LifeLine, or when the need is there, arrange for  someone to stay with patients. Smoking is a fire hazard, consider supervision or cessation. Risk of wandering should be assessed by caregiver and if detected at any point, supervision and safe proof recommendations should be instituted.  MEDICATION SUPERVISION: Inability to self-administer medication needs to be constantly addressed. Implement a mechanism to ensure safe administration of the medications.  RECOMMENDATIONS FOR ALL PATIENTS WITH MEMORY PROBLEMS: 1. Continue to exercise (Recommend 30 minutes of walking everyday, or 3 hours every week) 2. Increase social interactions - continue going to [place] and enjoy social gatherings with friends and family 3. Eat healthy, avoid fried foods and eat more fruits and vegetables 4. Maintain adequate blood pressure, blood sugar, and blood cholesterol level. Reducing the risk of stroke and cardiovascular disease also helps promoting better memory. 5. Avoid stressful situations. Live a simple life and avoid aggravations. Organize your time and prepare for the next day in anticipation. 6. Sleep well, avoid any interruptions of sleep and avoid any distractions in the bedroom that may interfere with adequate sleep quality 7. Avoid sugar, avoid sweets as there is a strong link between excessive sugar intake, diabetes, and cognitive impairment We discussed the Mediterranean diet, which has been shown to help patients reduce the risk of progressive memory disorders and reduces cardiovascular risk. This includes eating fish, eat fruits and green leafy vegetables, nuts like almonds and hazelnuts, walnuts, and also use olive oil. Avoid fast foods and fried foods as much as possible. Avoid sweets and sugar as sugar use has been linked to worsening of memory function.  There is always a concern of gradual progression of  memory problems. If this is the case, then we may need to adjust level of care according to patient needs. Support, both to the patient and caregiver,  should then be put into place.

## 2017-12-22 NOTE — Progress Notes (Signed)
NEUROLOGY CONSULTATION NOTE  Christina Russell MRN: [id number] DOB: July 23, 1951  Referring provider: Dr. Lovena Neighbours Primary care provider: Dr. Lovena Neighbours  Reason for consult:  seizures  Dear Dr Sydnee Cabal:  Thank you for your kind referral of Christina Russell for consultation of the above symptoms. Although her history is well known to you, please allow me to reiterate it for the purpose of our medical record. The patient was accompanied to the clinic by her daughter who also provides collateral information. Records and images were personally reviewed where available.  HISTORY OF PRESENT ILLNESS: This is a 67 year old right-handed woman with a history of seizures, abnormal brain MRI, presenting for neurological evaluation. She is poor historian, her daughter provides majority of the history. Her daughter reports that she separated with her husband and had a major breakdown in 1993, then apparently had brain imaging and was told she had MS. At that time she started having speech difficulties/dysarthria (noted in office today). She also started having mood issues ("emotions all over the place"). Her daughter reports that in the early 2000s she was using illicit drugs and alcohol and started having seizures. She was prescribed seizure medication. According to her daughter, the last seizure occurred in 2009, they were again told she had brain lesions, then she had another episode that may have been a seizure in 2012. She took seizure medication until 2013. They do not recall which medication she took. Her daughter decided to move her parents into her house in [place] in 2013. Her daughter reports that after the incident in 1993 and with subsequent drug (cocaine) and alcohol use, the patient's memory was shot. She used to cook a lot but would leave the stove on so daughter has been doing the cooking. Her daughter manages finances. She is independent with dressing and bathing but refuses to bathe  sometimes. She has difficulties using the right facial wash and one time had splotches on her face. She would eat a whole pack of cookies in one sitting. She would get very emotional just watching a TV show or when she feels she cannot help her husband. Her daughter denies any staring/unresponsive episodes. Her thoughts get scattered. She denies any olfactory/gustatory hallucinations, deja vu, rising epigastric sensation, focal numbness/tingling/weakness, myoclonic jerks. She denies any headaches, dizziness, diplopia, dysphagia, neck/back pain, bowel/bladder dysfunction. Speech has worsened since 1993. Sleeping is sporadic, she does a lot of pacing at night. Since moving in with her daughter, she has not had any further alcohol and has not been able to smoke. No falls.   Epilepsy Risk Factors:  She had a normal birth and early development.  There is no history of febrile convulsions, CNS infections such as meningitis/encephalitis, significant traumatic brain injury, neurosurgical procedures, or family history of seizures.  PAST MEDICAL HISTORY: History reviewed. No pertinent past medical history.  PAST SURGICAL HISTORY: History reviewed. No pertinent surgical history.  MEDICATIONS: No current outpatient medications on file prior to visit.   No current facility-administered medications on file prior to visit.     ALLERGIES: No Known Allergies  FAMILY HISTORY: History reviewed. No pertinent family history.  SOCIAL HISTORY: Social History   Socioeconomic History  . Marital status: Married    Spouse name: Not on file  . Number of children: Not on file  . Years of education: Not on file  . Highest education level: Not on file  Occupational History  . Not on file  Social Needs  . Financial resource strain: Not  on file  . Food insecurity:    Worry: Not on file    Inability: Not on file  . Transportation needs:    Medical: Not on file    Non-medical: Not on file  Tobacco Use  .  Smoking status: Former Games developer  . Smokeless tobacco: Never Used  Substance and Sexual Activity  . Alcohol use: Not on file  . Drug use: Not on file  . Sexual activity: Not on file  Lifestyle  . Physical activity:    Days per week: Not on file    Minutes per session: Not on file  . Stress: Not on file  Relationships  . Social connections:    Talks on phone: Not on file    Gets together: Not on file    Attends religious service: Not on file    Active member of club or organization: Not on file    Attends meetings of clubs or organizations: Not on file    Relationship status: Not on file  . Intimate partner violence:    Fear of current or ex partner: Not on file    Emotionally abused: Not on file    Physically abused: Not on file    Forced sexual activity: Not on file  Other Topics Concern  . Not on file  Social History Narrative  . Not on file    REVIEW OF SYSTEMS: Constitutional: No fevers, chills, or sweats, no generalized fatigue, change in appetite Eyes: No visual changes, double vision, eye pain Ear, nose and throat: No hearing loss, ear pain, nasal congestion, sore throat Cardiovascular: No chest pain, palpitations Respiratory:  No shortness of breath at rest or with exertion, wheezes GastrointestinaI: No nausea, vomiting, diarrhea, abdominal pain, fecal incontinence Genitourinary:  No dysuria, urinary retention or frequency Musculoskeletal:  No neck pain, back pain Integumentary: No rash, pruritus, skin lesions Neurological: as above Psychiatric: No depression, insomnia, anxiety Endocrine: No palpitations, fatigue, diaphoresis, mood swings, change in appetite, change in weight, increased thirst Hematologic/Lymphatic:  No anemia, purpura, petechiae. Allergic/Immunologic: no itchy/runny eyes, nasal congestion, recent allergic reactions, rashes  PHYSICAL EXAM: Vitals:   12/22/17 0945  BP: 108/66  Pulse: 71  SpO2: 95%   General: No acute distress Head:   Normocephalic/atraumatic Eyes: Fundoscopic exam shows bilateral sharp discs, no vessel changes, exudates, or hemorrhages Neck: supple, no paraspinal tenderness, full range of motion Back: No paraspinal tenderness Heart: regular rate and rhythm Lungs: Clear to auscultation bilaterally. Vascular: No carotid bruits. Skin/Extremities: No rash, no edema Neurological Exam: Mental status: alert and oriented to person, place, +mild dysarthria (more with pharyngeal sounds), no aphasia, Fund of knowledge is reduced. Recent and remote memory are impaired. 0/3 delayed recall.  Attention and concentration are normal, 3/5 WORLD backward.    Able to name objects and repeat phrases.  Cranial nerves: CN I: not tested CN II: pupils equal, round and reactive to light, visual fields intact, fundi unremarkable. CN III, IV, VI:  full range of motion, no nystagmus, no ptosis CN V: facial sensation intact CN VII: upper and lower face symmetric CN VIII: hearing intact to finger rub CN IX, X: gag intact, uvula midline CN XI: sternocleidomastoid and trapezius muscles intact CN XII: tongue midline Bulk & Tone: normal, no fasciculations. Motor: 5/5 throughout with no pronator drift. Sensation: intact to light touch, cold, pin, vibration and joint position sense.  No extinction to double simultaneous stimulation.  Romberg test negative Deep Tendon Reflexes: brisk +3 right UE with +Hoffman sign, brisk +  2 otherwise throughout, no ankle clonus Plantar responses: downgoing bilaterally Cerebellar: no incoordination on finger to nose, heel to shin. No dysdiadochokinesia Gait: narrow-based and steady, able to tandem walk adequately. Tremor: none  IMPRESSION: This is a 67 year old right-handed woman with a history of seizures around 19 years ago in the setting of substance abuse, report of abnormal brain imaging with prior diagnosis of MS given to family, no neurological follow-up, presenting today for evaluation. Her  neurological exam shows cognitive impairment, mild dysarthria, brisk reflexes. No seizures since at least 2012 off seizure medication. MRI brain with and without contrast will be ordered to evaluate brain lesions previously reported. Her daughter expressed interest in starting cholinesterase inhibitors for dementia, she will start Donepezil 5mg  daily for 2 weeks, then increase to 10mg  daily. Side effects and expectations from the medication were discussed. We also discussed 24/7 supervision with medications, resources given to daughter today. She will follow-up in 6 months and knows to call for any changes.   Thank you for allowing me to participate in the care of this patient. Please do not hesitate to call for any questions or concerns.   Patrcia Dolly, M.D.  CC: Dr. Sydnee Cabal

## 2017-12-27 ENCOUNTER — Ambulatory Visit: Payer: Medicare Other | Admitting: Sports Medicine

## 2018-01-01 ENCOUNTER — Ambulatory Visit
Admission: RE | Admit: 2018-01-01 | Discharge: 2018-01-01 | Disposition: A | Discharge status: Home / Self Care | Payer: Medicare Other | Source: Ambulatory Visit | Attending: Neurology | Admitting: Neurology

## 2018-01-01 DIAGNOSIS — F03A0 Unspecified dementia, mild, without behavioral disturbance, psychotic disturbance, mood disturbance, and anxiety: Secondary | ICD-10-CM

## 2018-01-01 DIAGNOSIS — R9089 Other abnormal findings on diagnostic imaging of central nervous system: Secondary | ICD-10-CM

## 2018-01-01 DIAGNOSIS — Z87898 Personal history of other specified conditions: Secondary | ICD-10-CM

## 2018-01-01 DIAGNOSIS — F0390 Unspecified dementia without behavioral disturbance: Secondary | ICD-10-CM | POA: Diagnosis not present

## 2018-01-01 MED ORDER — GADOBENATE DIMEGLUMINE 529 MG/ML IV SOLN
13.0000 mL | Freq: Once | INTRAVENOUS | Status: AC | PRN
  Administered 2018-01-01: 13 mL via INTRAVENOUS

## 2018-02-15 ENCOUNTER — Encounter

## 2018-02-15 ENCOUNTER — Ambulatory Visit: Payer: Medicare Other | Admitting: Neurology

## 2018-05-17 ENCOUNTER — Encounter: Payer: Self-pay | Admitting: Neurology

## 2018-07-07 ENCOUNTER — Ambulatory Visit: Payer: Medicare Other | Admitting: Neurology

## 2018-07-12 ENCOUNTER — Ambulatory Visit: Payer: Medicare Other | Admitting: Neurology

## 2018-10-20 ENCOUNTER — Encounter: Payer: Self-pay | Admitting: Family Medicine

## 2018-10-20 ENCOUNTER — Ambulatory Visit (INDEPENDENT_AMBULATORY_CARE_PROVIDER_SITE_OTHER): Payer: Medicare Other | Admitting: Family Medicine

## 2018-10-20 ENCOUNTER — Other Ambulatory Visit: Payer: Self-pay

## 2018-10-20 VITALS — BP 130/70 | HR 106 | Temp 98.7°F | Wt 146.4 lb

## 2018-10-20 DIAGNOSIS — Z1211 Encounter for screening for malignant neoplasm of colon: Secondary | ICD-10-CM | POA: Insufficient documentation

## 2018-10-20 DIAGNOSIS — E782 Mixed hyperlipidemia: Secondary | ICD-10-CM

## 2018-10-20 DIAGNOSIS — Z1159 Encounter for screening for other viral diseases: Secondary | ICD-10-CM | POA: Insufficient documentation

## 2018-10-20 DIAGNOSIS — R569 Unspecified convulsions: Secondary | ICD-10-CM

## 2018-10-20 DIAGNOSIS — Z13220 Encounter for screening for lipoid disorders: Secondary | ICD-10-CM | POA: Diagnosis not present

## 2018-10-20 DIAGNOSIS — E2839 Other primary ovarian failure: Secondary | ICD-10-CM

## 2018-10-20 DIAGNOSIS — Z23 Encounter for immunization: Secondary | ICD-10-CM | POA: Insufficient documentation

## 2018-10-20 DIAGNOSIS — Z515 Encounter for palliative care: Secondary | ICD-10-CM | POA: Insufficient documentation

## 2018-10-20 NOTE — Progress Notes (Signed)
   Subjective:    Patient ID: Christina Russell, female    DOB: September 27, 1950, 68 y.o.   MRN: [id number]   CC: Routine office visit   HPI: Patient is a 68 year old female who past medical history significant for seizures who presents today for her yearly office visit.  Patient reports that she has been doing well since I last saw her a year ago.  No new complaints.  Is currently living with her daughter and husband.  She reports good appetite, however daughter reports that she has poor sleep hygiene.  Patient last seizure was in 2009 although is unclear if she also had some seizure activity back in 2012.  She was last seen by neurology in May 2019.  She is has been taking donepezil started by neurology last year.  They have an upcoming appointment to follow-up with neurology.  Last year had a history of trigger finger and had an appointment set up at sports medicine was unable to follow-up. There she currently has any chest pain, mobility, shortness of breath, headache, dizziness, vision change, hearing change.  Smoking status reviewed   ROS: all other systems were reviewed and are negative other than in the HPI   No past medical history on file.  No past surgical history on file.  Past medical history, surgical, family, and social history reviewed and updated in the EMR as appropriate.  Objective:  BP 130/70   Pulse (!) 106   Temp 98.7 F (37.1 C) (Oral)   Wt 146 lb 6 oz (66.4 kg)   SpO2 94%   BMI 25.93 kg/m   Vitals and nursing note reviewed  General: NAD, pleasant, able to participate in exam Cardiac: RRR, normal heart sounds, no murmurs. 2+ radial and PT pulses bilaterally Respiratory: CTAB, normal effort, No wheezes, rales or rhonchi Abdomen: soft, nontender, nondistended, no hepatic or splenomegaly, +BS Extremities: no edema or cyanosis. WWP. Skin: warm and dry, no rashes noted Neuro: alert and oriented x4, no focal deficits Psych: Normal affect and mood   Assessment & Plan:     Seizures (HCC) We will follow-up on neurology visit.  Continue donepezil as instructed.  No recent seizure activities reported.  Minimal concern.  Screening for hyperlipidemia Will order lipid panel today.  Given age, we will decide if patient needs to be started on statin based on lipid panel result.  If patient is to be started on statin.  Estrogen deficiency Given patient's age, patient has never been screened for osteoporosis.  Will order DEXA scan today.  Screening for colon cancer Patient is 68 years old and never had a colonoscopy.  Will refer to GI for colonoscopy.  No concerning symptoms or weight loss reported by patient's.  Patient will receive flu and pneumonia shot today.  Hep C screening ordered.  We will follow-up on results.   Lovena Neighbours, MD [facility] PGY-3

## 2018-10-20 NOTE — Assessment & Plan Note (Signed)
We will follow-up on neurology visit.  Continue donepezil as instructed.  No recent seizure activities reported.  Minimal concern.

## 2018-10-20 NOTE — Assessment & Plan Note (Signed)
Will order lipid panel today.  Given age, we will decide if patient needs to be started on statin based on lipid panel result.  If patient is to be started on statin.

## 2018-10-20 NOTE — Assessment & Plan Note (Signed)
Patient is 68 years old and never had a colonoscopy.  Will refer to GI for colonoscopy.  No concerning symptoms or weight loss reported by patient's.

## 2018-10-20 NOTE — Assessment & Plan Note (Signed)
Given patient's age, patient has never been screened for osteoporosis.  Will order DEXA scan today.

## 2018-10-21 LAB — LIPID PANEL
CHOL/HDL RATIO: 3.6 ratio (ref 0.0–4.4)
CHOLESTEROL TOTAL: 223 mg/dL — AB (ref 100–199)
HDL: 62 mg/dL (ref >39)
LDL Calculated: 144 mg/dL — ABNORMAL HIGH (ref 0–99)
TRIGLYCERIDES: 85 mg/dL (ref 0–149)
VLDL Cholesterol Cal: 17 mg/dL (ref 5–40)

## 2018-10-21 LAB — HEPATITIS C ANTIBODY: Hep C Virus Ab: 0.1 s/co ratio (ref 0.0–0.9)

## 2019-02-20 ENCOUNTER — Telehealth: Payer: Self-pay | Admitting: *Deleted

## 2019-02-20 NOTE — Telephone Encounter (Signed)
Daughter feels like her mom would benefit from a Physiological scientist.  Unfortunately pts medicaid is not active at this time and we will not be able to proceed without medicaid.  Daughter will call and check on this.  Christen Bame, CMA

## 2019-09-04 ENCOUNTER — Ambulatory Visit: Payer: Medicare Other | Admitting: Family Medicine

## 2019-09-17 ENCOUNTER — Ambulatory Visit: Payer: Medicare Other | Admitting: Family Medicine

## 2019-11-28 ENCOUNTER — Ambulatory Visit: Payer: Medicare Other | Admitting: Family Medicine

## 2019-12-05 ENCOUNTER — Ambulatory Visit (INDEPENDENT_AMBULATORY_CARE_PROVIDER_SITE_OTHER): Payer: Medicare Other | Admitting: Family Medicine

## 2019-12-05 ENCOUNTER — Other Ambulatory Visit: Payer: Self-pay

## 2019-12-05 ENCOUNTER — Encounter: Payer: Self-pay | Admitting: Family Medicine

## 2019-12-05 VITALS — BP 120/60 | HR 76 | Ht 63.0 in | Wt 145.5 lb

## 2019-12-05 DIAGNOSIS — N958 Other specified menopausal and perimenopausal disorders: Secondary | ICD-10-CM

## 2019-12-05 DIAGNOSIS — Z0000 Encounter for general adult medical examination without abnormal findings: Secondary | ICD-10-CM

## 2019-12-05 DIAGNOSIS — R350 Frequency of micturition: Secondary | ICD-10-CM | POA: Diagnosis not present

## 2019-12-05 DIAGNOSIS — Z13820 Encounter for screening for osteoporosis: Secondary | ICD-10-CM | POA: Diagnosis not present

## 2019-12-05 DIAGNOSIS — Z1211 Encounter for screening for malignant neoplasm of colon: Secondary | ICD-10-CM | POA: Diagnosis not present

## 2019-12-05 DIAGNOSIS — Z1231 Encounter for screening mammogram for malignant neoplasm of breast: Secondary | ICD-10-CM | POA: Diagnosis not present

## 2019-12-05 DIAGNOSIS — E785 Hyperlipidemia, unspecified: Secondary | ICD-10-CM | POA: Insufficient documentation

## 2019-12-05 MED ORDER — ATORVASTATIN CALCIUM 40 MG PO TABS: 40.0000 mg | ORAL_TABLET | Freq: Every day | ORAL | 3 refills | Status: DC

## 2019-12-05 NOTE — Assessment & Plan Note (Signed)
UA ordered but in process.  Urine culture pending.  Will get BMP to screen for diabetes with blood sugar.

## 2019-12-05 NOTE — Progress Notes (Signed)
    SUBJECTIVE:   CHIEF COMPLAINT / HPI:   Patient presents for annual physical.  History of dementia Daughter is primary caregiver.  Takes donepezil.  Daughter reports that mother is doing well.  Patient does not take any other medications  Urinary frequency Reports excessive urinary frequency.  Denies hematuria, dysuria, fevers, abdominal pain.  Does endorse excessive thirst  History of seizures Denies any seizure since last encounter  Elevated ASCVD risk Score of greater than 10%.  Health maintenance Needs referral for mammogram, colonoscopy, DEXA scan  PERTINENT  PMH / PSH:  As above  OBJECTIVE:   BP 120/60   Pulse 76   Ht 5\' 3"  (1.6 m)   Wt 145 lb 8 oz (66 kg)   SpO2 95%   BMI 25.77 kg/m   Gen: NAD, resting comfortably CV: RRR with no murmurs appreciated Pulm: NWOB, CTAB with no crackles, wheezes, or rhonchi GI: Soft, Nontender, Nondistended. MSK: no edema, cyanosis, or clubbing noted Skin: warm, dry Neuro: grossly normal, moves all extremities Psych: Normal affect and thought content   ASSESSMENT/PLAN:   Problem List Items Addressed This Visit      Other   Urinary frequency - Primary    UA ordered but in process.  Urine culture pending.  Will get BMP to screen for diabetes with blood sugar.      Relevant Orders   Basic Metabolic Panel   Urine Culture   Hyperlipidemia    Elevated ASCVD score. Initiate atorvastatin 40 mg Repeat lipid panel      Relevant Medications   atorvastatin (LIPITOR) 40 MG tablet   Other Relevant Orders   Lipid Panel    Other Visit Diagnoses    Annual physical exam       Relevant Orders   Basic Metabolic Panel   Encounter for screening mammogram for malignant neoplasm of breast       Relevant Orders   MM Digital Screening   Screening for osteoporosis       Relevant Orders   DG Bone Density   Other specified menopausal and perimenopausal disorders        Relevant Orders   DG Bone Density   Screen for colon  cancer       Relevant Orders   Ambulatory referral to Gastroenterology       , MD [facility]

## 2019-12-05 NOTE — Assessment & Plan Note (Signed)
Elevated ASCVD score. Initiate atorvastatin 40 mg Repeat lipid panel

## 2019-12-05 NOTE — Patient Instructions (Addendum)
We are checking her urine for urinary tract infection.  We will call you with results.  You starting you on a cholesterol medication called atorvastatin.  We are checking his kidney function.  We have ordered you a bone scan to look for osteoporosis, mammogram to screen for breast cancer.  We are requesting records for colonoscopy.  Please go get your COVID-19 shot at any local pharmacy.

## 2019-12-06 ENCOUNTER — Encounter: Payer: Self-pay | Admitting: Family Medicine

## 2019-12-06 LAB — LIPID PANEL
Chol/HDL Ratio: 3.7 ratio (ref 0.0–4.4)
Cholesterol, Total: 249 mg/dL — ABNORMAL HIGH (ref 100–199)
HDL: 68 mg/dL (ref >39)
LDL Chol Calc (NIH): 169 mg/dL — ABNORMAL HIGH (ref 0–99)
Triglycerides: 73 mg/dL (ref 0–149)
VLDL Cholesterol Cal: 12 mg/dL (ref 5–40)

## 2019-12-06 LAB — BASIC METABOLIC PANEL
BUN/Creatinine Ratio: 23 (ref 12–28)
BUN: 15 mg/dL (ref 8–27)
CO2: 26 mmol/L (ref 20–29)
Calcium: 9.4 mg/dL (ref 8.7–10.3)
Chloride: 105 mmol/L (ref 96–106)
Creatinine, Ser: 0.66 mg/dL (ref 0.57–1.00)
GFR calc Af Amer: 104 mL/min/{1.73_m2} (ref >59)
GFR calc non Af Amer: 90 mL/min/{1.73_m2} (ref >59)
Glucose: 86 mg/dL (ref 65–99)
Potassium: 4.2 mmol/L (ref 3.5–5.2)
Sodium: 144 mmol/L (ref 134–144)

## 2019-12-07 LAB — URINE CULTURE

## 2019-12-25 ENCOUNTER — Telehealth: Payer: Self-pay

## 2019-12-25 DIAGNOSIS — Z742 Need for assistance at home and no other household member able to render care: Secondary | ICD-10-CM

## 2019-12-25 NOTE — Telephone Encounter (Signed)
Order placed

## 2019-12-25 NOTE — Telephone Encounter (Signed)
Patients daughter calls nurse line requesting orders for home health for her mother. Daughter stated she is having to feed her in the morning for breakfast and afternoon for lunch. Daughter stated she is having to do this for her dad as well, and its becoming too much. Daughter is already working with [facility], just place the referral for [facility] and they should fax [place] the paperwork, per daughter.

## 2020-01-07 ENCOUNTER — Encounter: Payer: Self-pay | Admitting: Nurse Practitioner

## 2020-01-30 ENCOUNTER — Encounter: Payer: Self-pay | Admitting: Nurse Practitioner

## 2020-01-30 ENCOUNTER — Ambulatory Visit (INDEPENDENT_AMBULATORY_CARE_PROVIDER_SITE_OTHER): Payer: Medicare Other | Admitting: Nurse Practitioner

## 2020-01-30 VITALS — BP 110/64 | HR 64 | Ht 62.0 in | Wt 144.0 lb

## 2020-01-30 DIAGNOSIS — F0390 Unspecified dementia without behavioral disturbance: Secondary | ICD-10-CM

## 2020-01-30 DIAGNOSIS — Z1211 Encounter for screening for malignant neoplasm of colon: Secondary | ICD-10-CM

## 2020-01-30 NOTE — Patient Instructions (Addendum)
If you are age 70 or older, your body mass index should be between 23-30. Your Body mass index is 26.34 kg/m. If this is out of the aforementioned range listed, please consider follow up with your Primary Care Provider.  If you are age 41 or younger, your body mass index should be between 19-25. Your Body mass index is 26.34 kg/m. If this is out of the aformentioned range listed, please consider follow up with your Primary Care Provider.   You have been scheduled for a colonoscopy. Please follow written instructions given to you at your visit today.  Please pick up your prep supplies at the pharmacy within the next 1-3 days. If you use inhalers (even only as needed), please bring them with you on the day of your procedure.  Follow up pending your Colonoscopy or as needed.

## 2020-01-30 NOTE — Progress Notes (Signed)
Reviewed and agree with management plans. ? ?Caleigh Rabelo L. Ondine Gemme, MD, MPH  ?

## 2020-01-30 NOTE — Progress Notes (Signed)
ASSESSMENT / PLAN:   69 yo female with pmh significant for hyperlipidemia and dementia  #Colon cancer screening --No family history colon cancer.  No GI symptoms.  --Patient will be scheduled for a colonoscopy. The risks and benefits of colonoscopy with possible polypectomy / biopsies were discussed and the patient agrees to proceed.   # Dementia --Daughter present for visit.  Colonoscopy explained to patient and daughter.  Questions answered.  Daughter will sign consent for patient   HPI:     Chief Complaint:  Colon cancer screening.    Christina Russell is a 69 yo female referred by PCP for colon cancer screening.  Patient has dementia, daughter helps with history.  Patient has never had a colonoscopy, no family history colon cancer.  Patient has no GI symptoms.  Specifically, she has no bowel changes, no blood in stool.  No abdominal pain. Not anemic based on recent labs.   Data Reviewed:   12/05/19 BMET normal.  CBC normal   Past Medical History:  Diagnosis Date  . Dementia (HCC)   . HLD (hyperlipidemia)      Past Surgical History:  Procedure Laterality Date  . CATARACT EXTRACTION, BILATERAL     Family History  Problem Relation Age of Onset  . Cerebral aneurysm Mother   . Diabetes Sister   . Heart disease Sister   . Hypertension Sister   . Breast cancer Sister   . Breast cancer Daughter    Social History   Tobacco Use  . Smoking status: Former Smoker    Types: Cigarettes    Quit date: 2017    Years since quitting: 4.4  . Smokeless tobacco: Current User    Types: Chew  Vaping Use  . Vaping Use: Never used  Substance Use Topics  . Alcohol use: Not Currently  . Drug use: Never   Current Outpatient Medications  Medication Sig Dispense Refill  . atorvastatin (LIPITOR) 40 MG tablet Take 1 tablet (40 mg total) by mouth daily. 90 tablet 3  . donepezil (ARICEPT) 10 MG tablet Take 1/2 tablet daily for 2 weeks, then increase to 1 tablet daily 30  tablet 11   No current facility-administered medications for this visit.   No Known Allergies   Review of Systems: Positive for confusion, sleeping problems, excessive urination and urine leakage.  All other systems reviewed and negative except where noted in HPI.   Creatinine clearance cannot be calculated (Patient's most recent lab result is older than the maximum 21 days allowed.)   Physical Exam:    Wt Readings from Last 3 Encounters:  01/30/20 144 lb (65.3 kg)  12/05/19 145 lb 8 oz (66 kg)  10/20/18 146 lb 6 oz (66.4 kg)    BP 110/64 (BP Location: Left Arm, Patient Position: Sitting, Cuff Size: Normal)   Pulse 64   Ht 5\' 2"  (1.575 m) Comment: height measured without shoes  Wt 144 lb (65.3 kg)   BMI 26.34 kg/m  Constitutional:  Pleasant female in no acute distress. Psychiatric: Normal mood and affect. Behavior is normal. EENT: Pupils normal.  Conjunctivae are normal. No scleral icterus. Neck supple.  Cardiovascular: Normal rate, regular rhythm. No edema Pulmonary/chest: Effort normal and breath sounds normal. No wheezing, rales or rhonchi. Abdominal: Soft, nondistended, nontender. Bowel sounds active throughout. There are no masses palpable. No hepatomegaly. Neurological: Alert and oriented to person place and time. Skin: Skin is warm and dry. No rashes noted.  Tye Savoy, NP  01/30/2020, 2:50 PM  Cc:  Referring Provider Bonnita Hollow, MD

## 2020-02-06 ENCOUNTER — Encounter: Payer: Medicare Other | Admitting: Gastroenterology

## 2020-02-15 ENCOUNTER — Ambulatory Visit: Payer: Medicare Other

## 2020-02-15 ENCOUNTER — Other Ambulatory Visit: Payer: Medicare Other

## 2020-03-06 IMAGING — MR MR HEAD WO/W CM
12 series · 48 of 48 positions shown · IV contrast (13 ml Multihance)
Comparison: None.

CLINICAL DATA: Mild dementia.  History of seizure

EXAM:
MRI HEAD WITHOUT AND WITH CONTRAST
TECHNIQUE: Multiplanar, multiecho pulse sequences of the brain and surrounding
structures were obtained without and with intravenous contrast.
CONTRAST:  13mL MULTIHANCE GADOBENATE DIMEGLUMINE 529 MG/ML IV SOLN
Creatinine was obtained on site at [HOSPITAL] at [HOSPITAL].
Results: Creatinine 0.7 mg/dL.

[Series 3: t1_se_sag · sagittal · 5.0mm · 0.45mm/px · 1 of 21 slices shown]
[im 1/21]
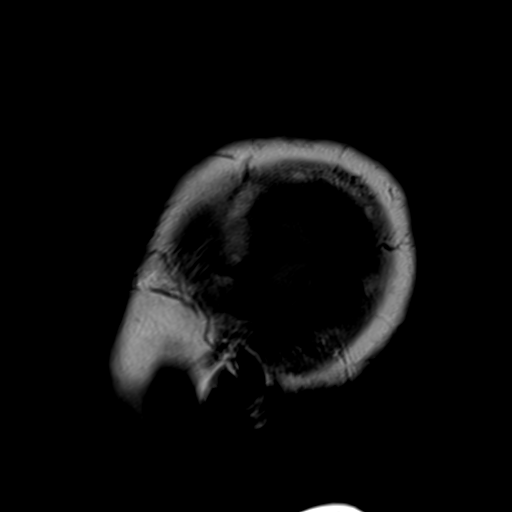

[Series 4: ep2d_diff_(id)_trace · axial · 3.0mm · 1.80mm/px · z∈[-64,+69]mm · 7 of 96 slices shown]
[im 1/96]
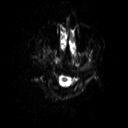
[im 16/96]
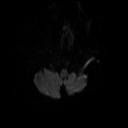
[im 32/96]
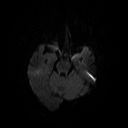
[im 48/96]
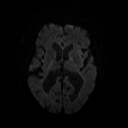
[im 64/96]
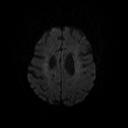
[im 80/96]
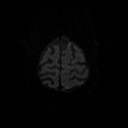
[im 96/96]
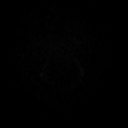

[Series 5: ep2d_diff_(id)_trace_adc · axial · 3.0mm · 1.80mm/px · z∈[-64,+69]mm · 4 of 48 slices shown]
[im 1/48]
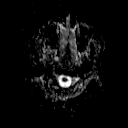
[im 16/48]
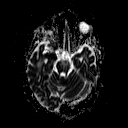
[im 32/48]
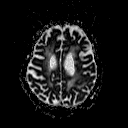
[im 48/48]
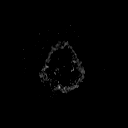

[Series 7: swi_images · axial · 5.0mm · 0.90mm/px · z∈[-65,+72]mm · 2 of 30 slices shown]
[im 1/30]
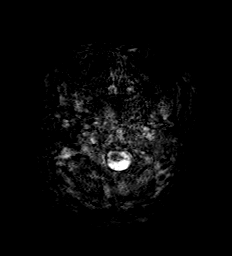
[im 30/30]
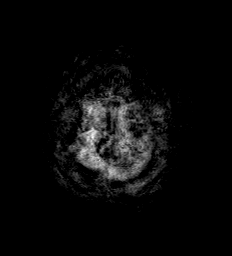

[Series 8: FLAIR · axial · 3.0mm · 0.43mm/px · z∈[-64,+67]mm · 2 of 24 slices shown (1 of 2)]
[im 1/24]
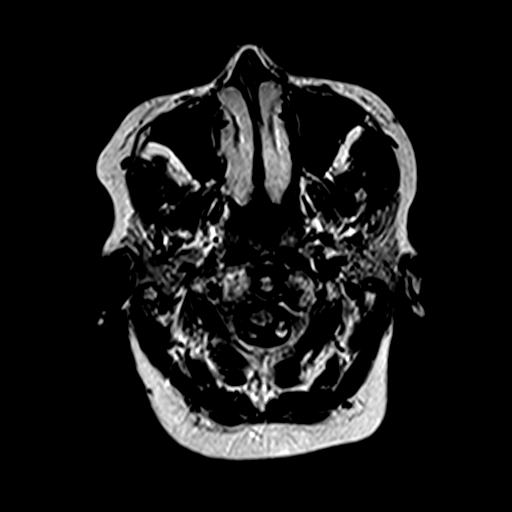
[im 24/24]
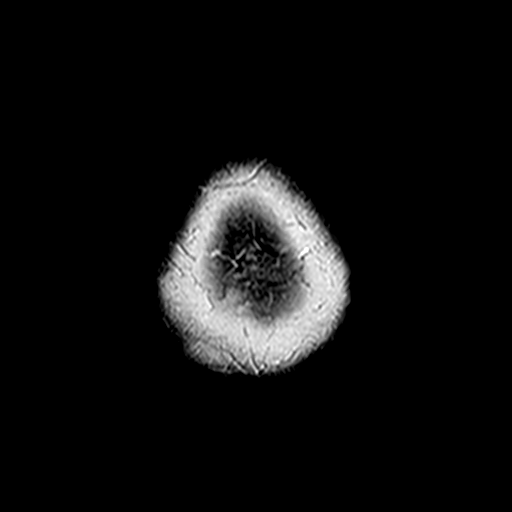

[Series 9: FLAIR · sagittal · 5.0mm · 0.45mm/px · 2 of 21 slices shown (2 of 2)]
[im 1/21]
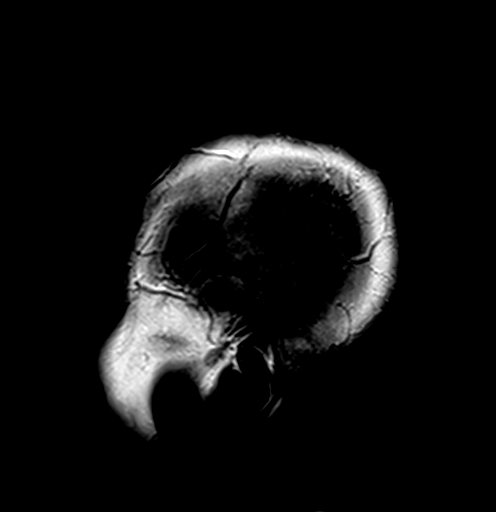
[im 21/21]
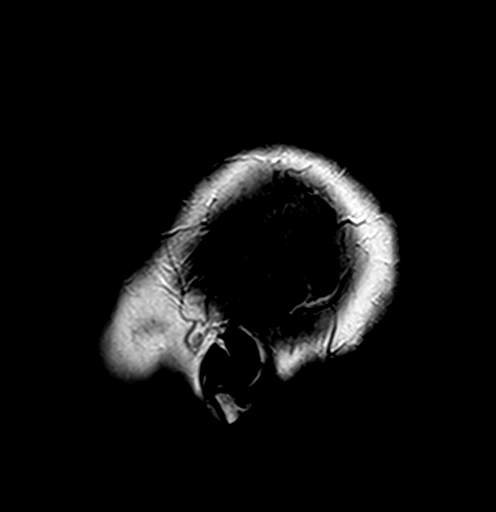

[Series 10: t2_tse_tra_512 · axial · 5.0mm · 0.60mm/px · z∈[-59,+71]mm · 2 of 24 slices shown]
[im 1/24]
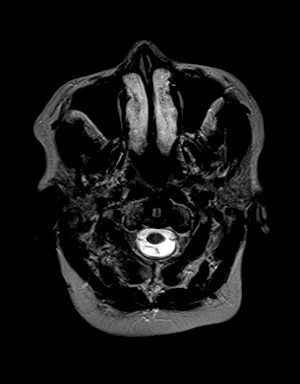
[im 24/24]
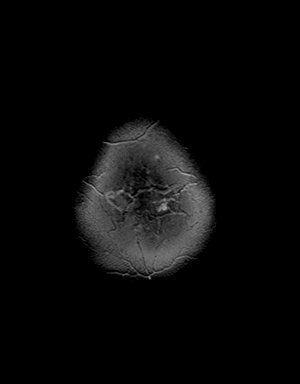

[Series 12: t1_mpr_tra · axial · 1.0mm · 0.72mm/px · z∈[-61,+74]mm · 11 of 144 slices shown]
[im 1/144]
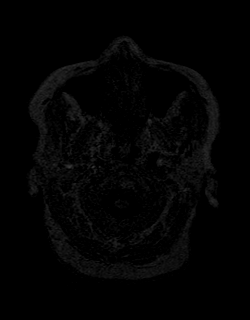
[im 15/144]
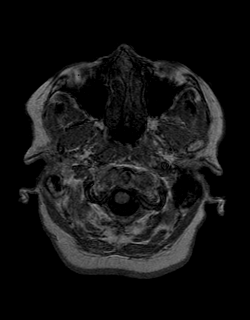
[im 29/144]
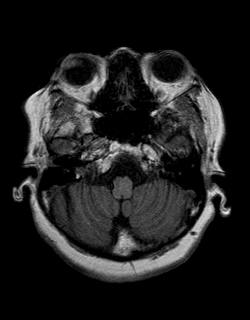
[im 43/144]
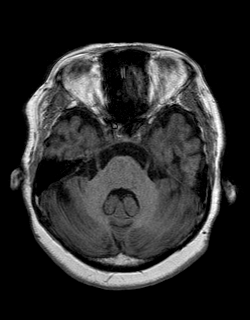
[im 58/144]
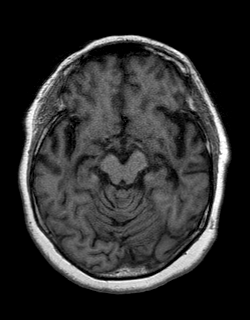
[im 72/144]
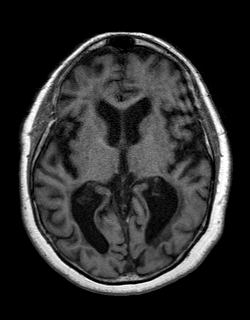
[im 86/144]
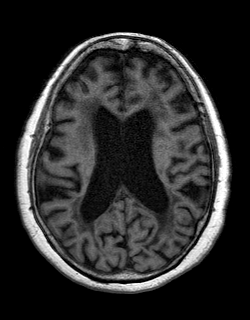
[im 101/144]
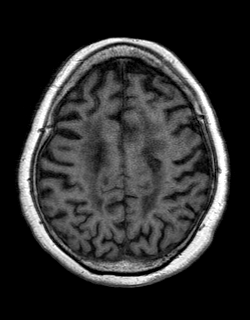
[im 115/144]
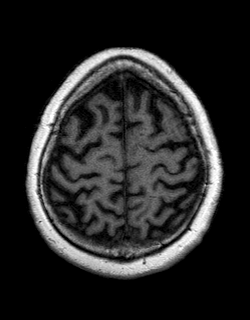
[im 129/144]
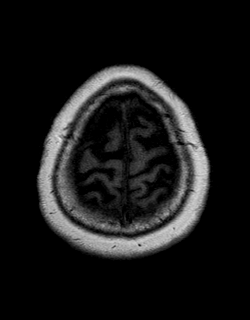
[im 144/144]
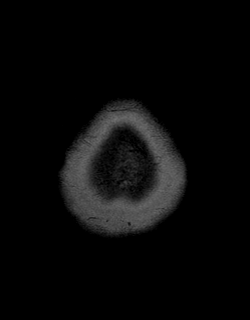

[Series 14: T2 · coronal · 5.0mm · 0.45mm/px · 2 of 25 slices shown]
[im 1/25]
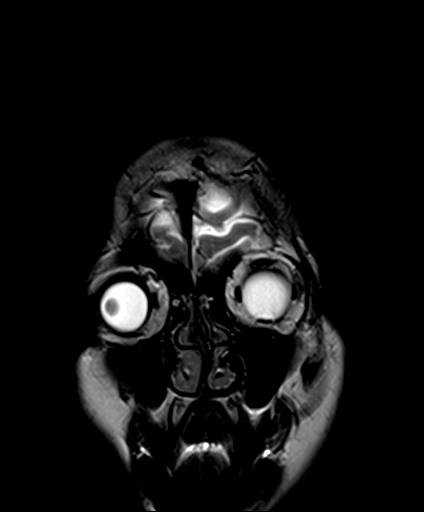
[im 25/25]
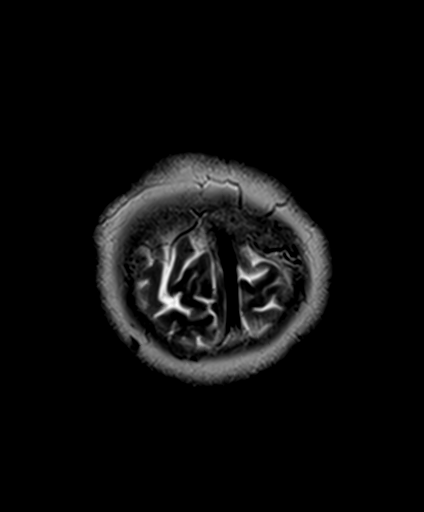

[Series 15: T1 post-contrast · coronal · 5.0mm · 0.72mm/px · 2 of 25 slices shown]
[im 1/25]
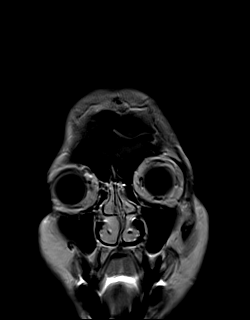
[im 25/25]
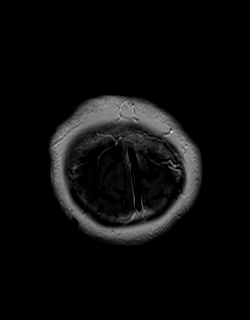

[Series 16: post t1_mpr_tra · axial · 1.0mm · 0.72mm/px · z∈[-61,+74]mm · 11 of 144 slices shown]
[im 1/144]
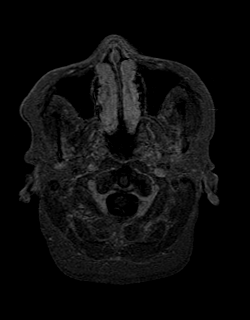
[im 15/144]
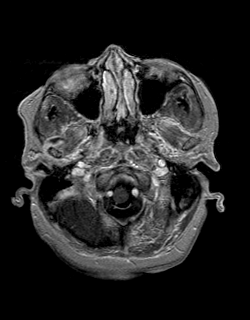
[im 29/144]
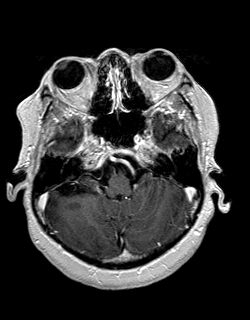
[im 43/144]
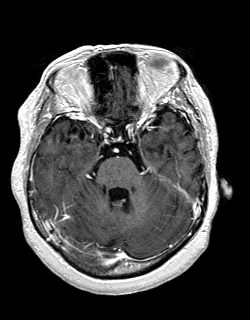
[im 58/144]
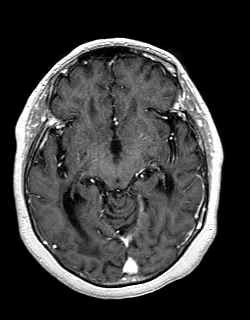
[im 72/144]
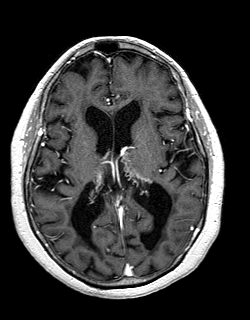
[im 86/144]
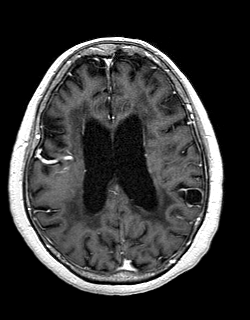
[im 101/144]
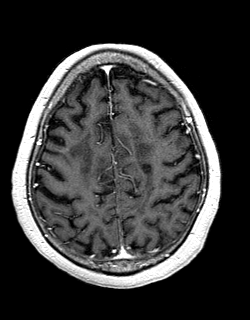
[im 115/144]
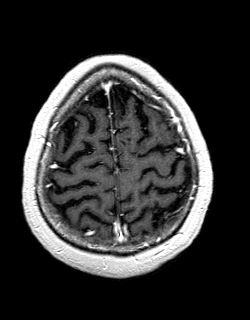
[im 129/144]
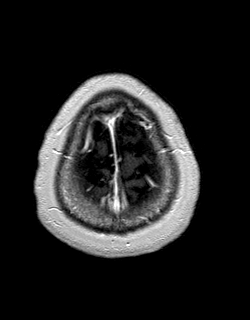
[im 144/144]
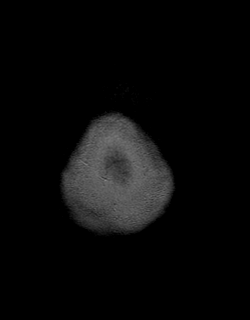

[Series 17: t1_se_sag post · sagittal · 5.0mm · 0.45mm/px · 2 of 21 slices shown]
[im 1/21]
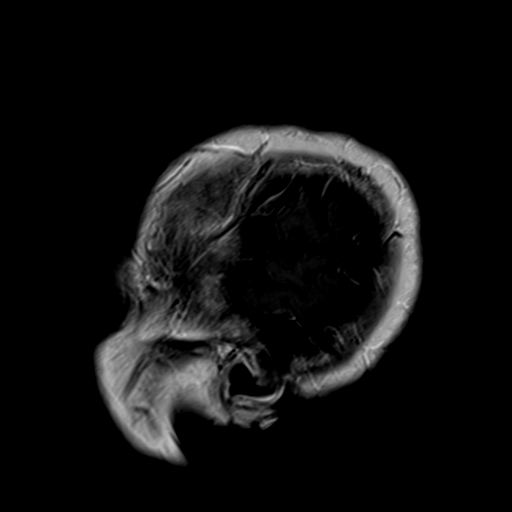
[im 21/21]
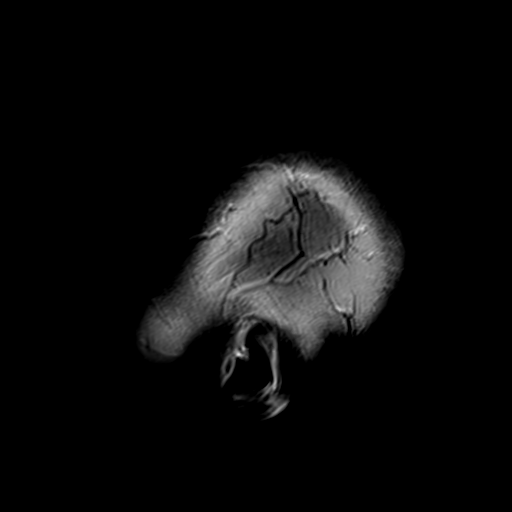

[48 of 48 positions shown; findings below may reference images not displayed]

FINDINGS: Brain: Generalized atrophy most significantly affecting white
matter, which is thinned diffusely. The remaining white matter is
diffusely FLAIR hyperintense. No acute infarct, hemorrhage,
hydrocephalus, collection, or masslike finding. Apparent T2
hyperintensity over the cervicomedullary junction on axial T2
weighted imaging is not confirmed on the other sequences and is
attributed to artifact.

Vascular: Major flow voids and vascular enhancements are preserved.
There is a developmental venous anomaly in the right frontal lobe
without superimposed complicating feature such as blood products or
focal ischemic change.

Skull and upper cervical spine: No evidence of marrow lesion

Sinuses/Orbits: Left cataract resection.  No significant finding.
IMPRESSION: 1. Severe cerebral white matter atrophy with marked thinning of the
corpus callosum. The remaining white matter is diffusely gliotic.
Severe chronic small vessel disease could give this appearance, but
there is notable sparing of the brainstem and basal ganglia,
question chronic advanced multiple sclerosis.
2. No acute or reversible finding.

## 2020-03-27 DIAGNOSIS — Z20822 Contact with and (suspected) exposure to covid-19: Secondary | ICD-10-CM | POA: Diagnosis not present

## 2020-03-27 DIAGNOSIS — U071 COVID-19: Secondary | ICD-10-CM

## 2020-03-27 HISTORY — DX: COVID-19: U07.1

## 2020-03-30 ENCOUNTER — Encounter (HOSPITAL_COMMUNITY): Payer: Self-pay

## 2020-03-30 ENCOUNTER — Inpatient Hospital Stay (HOSPITAL_COMMUNITY): Payer: Medicare Other

## 2020-03-30 ENCOUNTER — Inpatient Hospital Stay (HOSPITAL_COMMUNITY)
Admission: EM | Admit: 2020-03-30 | Discharge: 2020-04-21 | DRG: 177 | Disposition: A | Discharge status: Hospice | Payer: Medicare Other | Attending: Internal Medicine | Admitting: Internal Medicine

## 2020-03-30 ENCOUNTER — Other Ambulatory Visit: Payer: Self-pay

## 2020-03-30 ENCOUNTER — Emergency Department (HOSPITAL_COMMUNITY): Payer: Medicare Other

## 2020-03-30 DIAGNOSIS — J90 Pleural effusion, not elsewhere classified: Secondary | ICD-10-CM | POA: Diagnosis not present

## 2020-03-30 DIAGNOSIS — G9341 Metabolic encephalopathy: Secondary | ICD-10-CM | POA: Diagnosis not present

## 2020-03-30 DIAGNOSIS — J9600 Acute respiratory failure, unspecified whether with hypoxia or hypercapnia: Secondary | ICD-10-CM | POA: Diagnosis present

## 2020-03-30 DIAGNOSIS — E43 Unspecified severe protein-calorie malnutrition: Secondary | ICD-10-CM | POA: Diagnosis not present

## 2020-03-30 DIAGNOSIS — Z283 Underimmunization status: Secondary | ICD-10-CM

## 2020-03-30 DIAGNOSIS — I517 Cardiomegaly: Secondary | ICD-10-CM | POA: Diagnosis not present

## 2020-03-30 DIAGNOSIS — R0602 Shortness of breath: Secondary | ICD-10-CM | POA: Diagnosis not present

## 2020-03-30 DIAGNOSIS — Z7401 Bed confinement status: Secondary | ICD-10-CM | POA: Diagnosis not present

## 2020-03-30 DIAGNOSIS — I110 Hypertensive heart disease with heart failure: Secondary | ICD-10-CM | POA: Diagnosis present

## 2020-03-30 DIAGNOSIS — I10 Essential (primary) hypertension: Secondary | ICD-10-CM | POA: Diagnosis not present

## 2020-03-30 DIAGNOSIS — U071 COVID-19: Principal | ICD-10-CM | POA: Diagnosis present

## 2020-03-30 DIAGNOSIS — Z87891 Personal history of nicotine dependence: Secondary | ICD-10-CM | POA: Diagnosis not present

## 2020-03-30 DIAGNOSIS — J1282 Pneumonia due to coronavirus disease 2019: Secondary | ICD-10-CM | POA: Diagnosis not present

## 2020-03-30 DIAGNOSIS — J8410 Pulmonary fibrosis, unspecified: Secondary | ICD-10-CM | POA: Diagnosis not present

## 2020-03-30 DIAGNOSIS — I5033 Acute on chronic diastolic (congestive) heart failure: Secondary | ICD-10-CM | POA: Diagnosis not present

## 2020-03-30 DIAGNOSIS — Z7189 Other specified counseling: Secondary | ICD-10-CM | POA: Diagnosis not present

## 2020-03-30 DIAGNOSIS — J690 Pneumonitis due to inhalation of food and vomit: Secondary | ICD-10-CM | POA: Diagnosis not present

## 2020-03-30 DIAGNOSIS — F0390 Unspecified dementia without behavioral disturbance: Secondary | ICD-10-CM | POA: Diagnosis not present

## 2020-03-30 DIAGNOSIS — Z79899 Other long term (current) drug therapy: Secondary | ICD-10-CM

## 2020-03-30 DIAGNOSIS — R0689 Other abnormalities of breathing: Secondary | ICD-10-CM | POA: Diagnosis not present

## 2020-03-30 DIAGNOSIS — R0902 Hypoxemia: Secondary | ICD-10-CM

## 2020-03-30 DIAGNOSIS — Z833 Family history of diabetes mellitus: Secondary | ICD-10-CM | POA: Diagnosis not present

## 2020-03-30 DIAGNOSIS — I340 Nonrheumatic mitral (valve) insufficiency: Secondary | ICD-10-CM | POA: Diagnosis not present

## 2020-03-30 DIAGNOSIS — Z6822 Body mass index (BMI) 22.0-22.9, adult: Secondary | ICD-10-CM | POA: Diagnosis not present

## 2020-03-30 DIAGNOSIS — J9601 Acute respiratory failure with hypoxia: Secondary | ICD-10-CM | POA: Diagnosis not present

## 2020-03-30 DIAGNOSIS — R7989 Other specified abnormal findings of blood chemistry: Secondary | ICD-10-CM | POA: Diagnosis present

## 2020-03-30 DIAGNOSIS — Z8616 Personal history of COVID-19: Secondary | ICD-10-CM | POA: Diagnosis not present

## 2020-03-30 DIAGNOSIS — R64 Cachexia: Secondary | ICD-10-CM | POA: Diagnosis not present

## 2020-03-30 DIAGNOSIS — Z515 Encounter for palliative care: Secondary | ICD-10-CM

## 2020-03-30 DIAGNOSIS — Z9981 Dependence on supplemental oxygen: Secondary | ICD-10-CM | POA: Diagnosis not present

## 2020-03-30 DIAGNOSIS — E785 Hyperlipidemia, unspecified: Secondary | ICD-10-CM | POA: Diagnosis present

## 2020-03-30 DIAGNOSIS — Z803 Family history of malignant neoplasm of breast: Secondary | ICD-10-CM | POA: Diagnosis not present

## 2020-03-30 DIAGNOSIS — E870 Hyperosmolality and hypernatremia: Secondary | ICD-10-CM | POA: Diagnosis not present

## 2020-03-30 DIAGNOSIS — Z8249 Family history of ischemic heart disease and other diseases of the circulatory system: Secondary | ICD-10-CM

## 2020-03-30 DIAGNOSIS — R0600 Dyspnea, unspecified: Secondary | ICD-10-CM | POA: Diagnosis not present

## 2020-03-30 DIAGNOSIS — R4182 Altered mental status, unspecified: Secondary | ICD-10-CM | POA: Diagnosis not present

## 2020-03-30 DIAGNOSIS — B948 Sequelae of other specified infectious and parasitic diseases: Secondary | ICD-10-CM | POA: Diagnosis not present

## 2020-03-30 DIAGNOSIS — J9690 Respiratory failure, unspecified, unspecified whether with hypoxia or hypercapnia: Secondary | ICD-10-CM | POA: Diagnosis not present

## 2020-03-30 DIAGNOSIS — Z66 Do not resuscitate: Secondary | ICD-10-CM | POA: Diagnosis not present

## 2020-03-30 DIAGNOSIS — R627 Adult failure to thrive: Secondary | ICD-10-CM | POA: Diagnosis present

## 2020-03-30 DIAGNOSIS — J189 Pneumonia, unspecified organism: Secondary | ICD-10-CM | POA: Diagnosis not present

## 2020-03-30 DIAGNOSIS — R1310 Dysphagia, unspecified: Secondary | ICD-10-CM | POA: Diagnosis not present

## 2020-03-30 DIAGNOSIS — I361 Nonrheumatic tricuspid (valve) insufficiency: Secondary | ICD-10-CM | POA: Diagnosis not present

## 2020-03-30 DIAGNOSIS — M2550 Pain in unspecified joint: Secondary | ICD-10-CM | POA: Diagnosis not present

## 2020-03-30 DIAGNOSIS — R05 Cough: Secondary | ICD-10-CM | POA: Diagnosis not present

## 2020-03-30 LAB — CBC WITH DIFFERENTIAL/PLATELET
Abs Immature Granulocytes: 0.07 10*3/uL (ref 0.00–0.07)
Basophils Absolute: 0 10*3/uL (ref 0.0–0.1)
Basophils Relative: 0 %
Eosinophils Absolute: 0 10*3/uL (ref 0.0–0.5)
Eosinophils Relative: 0 %
HCT: 39.2 % (ref 36.0–46.0)
Hemoglobin: 12.4 g/dL (ref 12.0–15.0)
Immature Granulocytes: 1 %
Lymphocytes Relative: 8 %
Lymphs Abs: 0.6 10*3/uL — ABNORMAL LOW (ref 0.7–4.0)
MCH: 29.3 pg (ref 26.0–34.0)
MCHC: 31.6 g/dL (ref 30.0–36.0)
MCV: 92.7 fL (ref 80.0–100.0)
Monocytes Absolute: 0.4 10*3/uL (ref 0.1–1.0)
Monocytes Relative: 5 %
Neutro Abs: 6.3 10*3/uL (ref 1.7–7.7)
Neutrophils Relative %: 86 %
Platelets: 199 10*3/uL (ref 150–400)
RBC: 4.23 MIL/uL (ref 3.87–5.11)
RDW: 14 % (ref 11.5–15.5)
WBC: 7.4 10*3/uL (ref 4.0–10.5)
nRBC: 0 % (ref 0.0–0.2)

## 2020-03-30 LAB — I-STAT ARTERIAL BLOOD GAS, ED
Acid-Base Excess: 4 mmol/L — ABNORMAL HIGH (ref 0.0–2.0)
Bicarbonate: 28.4 mmol/L — ABNORMAL HIGH (ref 20.0–28.0)
Calcium, Ion: 1.08 mmol/L — ABNORMAL LOW (ref 1.15–1.40)
HCT: 33 % — ABNORMAL LOW (ref 36.0–46.0)
Hemoglobin: 11.2 g/dL — ABNORMAL LOW (ref 12.0–15.0)
O2 Saturation: 87 %
Patient temperature: 98.3
Potassium: 3.9 mmol/L (ref 3.5–5.1)
Sodium: 139 mmol/L (ref 135–145)
TCO2: 30 mmol/L (ref 22–32)
pCO2 arterial: 39.2 mmHg (ref 32.0–48.0)
pH, Arterial: 7.468 — ABNORMAL HIGH (ref 7.350–7.450)
pO2, Arterial: 49 mmHg — ABNORMAL LOW (ref 83.0–108.0)

## 2020-03-30 LAB — LACTIC ACID, PLASMA
Lactic Acid, Venous: 2.2 mmol/L (ref 0.5–1.9)
Lactic Acid, Venous: 1.1 mmol/L (ref 0.5–1.9)

## 2020-03-30 LAB — COMPREHENSIVE METABOLIC PANEL
ALT: 21 U/L (ref 0–44)
AST: 38 U/L (ref 15–41)
Albumin: 2.6 g/dL — ABNORMAL LOW (ref 3.5–5.0)
Alkaline Phosphatase: 48 U/L (ref 38–126)
Anion gap: 14 (ref 5–15)
BUN: 8 mg/dL (ref 8–23)
CO2: 25 mmol/L (ref 22–32)
Calcium: 8.3 mg/dL — ABNORMAL LOW (ref 8.9–10.3)
Chloride: 96 mmol/L — ABNORMAL LOW (ref 98–111)
Creatinine, Ser: 0.71 mg/dL (ref 0.44–1.00)
GFR calc Af Amer: >60 mL/min (ref >60)
GFR calc non Af Amer: >60 mL/min (ref >60)
Glucose, Bld: 137 mg/dL — ABNORMAL HIGH (ref 70–99)
Potassium: 3.9 mmol/L (ref 3.5–5.1)
Sodium: 135 mmol/L (ref 135–145)
Total Bilirubin: 0.7 mg/dL (ref 0.3–1.2)
Total Protein: 7.1 g/dL (ref 6.5–8.1)

## 2020-03-30 LAB — LACTATE DEHYDROGENASE: LDH: 424 U/L — ABNORMAL HIGH (ref 98–192)

## 2020-03-30 LAB — C-REACTIVE PROTEIN: CRP: 21.9 mg/dL — ABNORMAL HIGH (ref <1.0)

## 2020-03-30 LAB — D-DIMER, QUANTITATIVE: D-Dimer, Quant: 2.43 ug/mL-FEU — ABNORMAL HIGH (ref 0.00–0.50)

## 2020-03-30 LAB — HIV ANTIBODY (ROUTINE TESTING W REFLEX): HIV Screen 4th Generation wRfx: NONREACTIVE

## 2020-03-30 LAB — FIBRINOGEN: Fibrinogen: 785 mg/dL — ABNORMAL HIGH (ref 210–475)

## 2020-03-30 LAB — SARS CORONAVIRUS 2 BY RT PCR (HOSPITAL ORDER, PERFORMED IN ~~LOC~~ HOSPITAL LAB): SARS Coronavirus 2: POSITIVE — AB

## 2020-03-30 LAB — PROCALCITONIN: Procalcitonin: 0.11 ng/mL

## 2020-03-30 LAB — FERRITIN: Ferritin: 496 ng/mL — ABNORMAL HIGH (ref 11–307)

## 2020-03-30 LAB — TRIGLYCERIDES: Triglycerides: 75 mg/dL (ref <150)

## 2020-03-30 LAB — BRAIN NATRIURETIC PEPTIDE: B Natriuretic Peptide: 160.2 pg/mL — ABNORMAL HIGH (ref 0.0–100.0)

## 2020-03-30 LAB — ABO/RH: ABO/RH(D): A POS

## 2020-03-30 MED ORDER — HEPARIN SODIUM (PORCINE) 5000 UNIT/ML IJ SOLN: 5000.0000 [IU] | Freq: Three times a day (TID) | INTRAMUSCULAR | Status: DC

## 2020-03-30 MED ORDER — ALBUTEROL SULFATE HFA 108 (90 BASE) MCG/ACT IN AERS
2.0000 | INHALATION_SPRAY | Freq: Four times a day (QID) | RESPIRATORY_TRACT | Status: DC
  Administered 2020-03-31 (×4): 2 via RESPIRATORY_TRACT
  Filled 2020-03-30: qty 6.7

## 2020-03-30 MED ORDER — ASCORBIC ACID 500 MG PO TABS
500.0000 mg | ORAL_TABLET | Freq: Every day | ORAL | Status: DC
  Administered 2020-03-31 – 2020-04-20 (×20): 500 mg via ORAL
  Filled 2020-03-30 (×21): qty 1

## 2020-03-30 MED ORDER — ATORVASTATIN CALCIUM 40 MG PO TABS
40.0000 mg | ORAL_TABLET | Freq: Every day | ORAL | Status: DC
  Administered 2020-03-31 – 2020-04-20 (×20): 40 mg via ORAL
  Filled 2020-03-30 (×20): qty 1

## 2020-03-30 MED ORDER — CHLORHEXIDINE GLUCONATE CLOTH 2 % EX PADS
6.0000 | MEDICATED_PAD | Freq: Every day | CUTANEOUS | Status: DC
  Administered 2020-03-31 – 2020-04-03 (×5): 6 via TOPICAL

## 2020-03-30 MED ORDER — DEXAMETHASONE SODIUM PHOSPHATE 10 MG/ML IJ SOLN
6.0000 mg | INTRAMUSCULAR | Status: DC
  Administered 2020-03-30: 6 mg via INTRAVENOUS
  Filled 2020-03-30: qty 1

## 2020-03-30 MED ORDER — SODIUM CHLORIDE 0.9 % IV SOLN
100.0000 mg | Freq: Every day | INTRAVENOUS | Status: AC
  Administered 2020-03-31 – 2020-04-03 (×4): 100 mg via INTRAVENOUS
  Filled 2020-03-30 (×5): qty 20

## 2020-03-30 MED ORDER — SODIUM CHLORIDE 0.9% FLUSH
3.0000 mL | Freq: Two times a day (BID) | INTRAVENOUS | Status: DC
  Administered 2020-03-31 – 2020-04-01 (×4): 3 mL via INTRAVENOUS

## 2020-03-30 MED ORDER — POLYETHYLENE GLYCOL 3350 17 G PO PACK: 17.0000 g | PACK | Freq: Every day | ORAL | Status: DC | PRN

## 2020-03-30 MED ORDER — BARICITINIB 2 MG PO TABS
2.0000 mg | ORAL_TABLET | Freq: Every day | ORAL | Status: AC
  Administered 2020-03-31 – 2020-04-12 (×13): 2 mg via ORAL
  Filled 2020-03-30 (×15): qty 1

## 2020-03-30 MED ORDER — ENOXAPARIN SODIUM 40 MG/0.4ML ~~LOC~~ SOLN
40.0000 mg | SUBCUTANEOUS | Status: DC
  Administered 2020-03-30 – 2020-04-01 (×3): 40 mg via SUBCUTANEOUS
  Filled 2020-03-30 (×3): qty 0.4

## 2020-03-30 MED ORDER — HYDROCOD POLST-CPM POLST ER 10-8 MG/5ML PO SUER
5.0000 mL | Freq: Two times a day (BID) | ORAL | Status: DC | PRN
  Administered 2020-04-03 – 2020-04-19 (×8): 5 mL via ORAL
  Filled 2020-03-30 (×9): qty 5

## 2020-03-30 MED ORDER — ACETAMINOPHEN 325 MG PO TABS: 650.0000 mg | ORAL_TABLET | Freq: Four times a day (QID) | ORAL | Status: DC | PRN

## 2020-03-30 MED ORDER — ONDANSETRON HCL 4 MG PO TABS: 4.0000 mg | ORAL_TABLET | Freq: Four times a day (QID) | ORAL | Status: DC | PRN

## 2020-03-30 MED ORDER — DOCUSATE SODIUM 100 MG PO CAPS: 100.0000 mg | ORAL_CAPSULE | Freq: Two times a day (BID) | ORAL | Status: DC | PRN

## 2020-03-30 MED ORDER — SODIUM CHLORIDE 0.9 % IV SOLN
250.0000 mL | INTRAVENOUS | Status: DC | PRN
  Administered 2020-03-31: 250 mL via INTRAVENOUS

## 2020-03-30 MED ORDER — ONDANSETRON HCL 4 MG/2ML IJ SOLN: 4.0000 mg | Freq: Four times a day (QID) | INTRAMUSCULAR | Status: DC | PRN

## 2020-03-30 MED ORDER — SODIUM CHLORIDE 0.9% FLUSH: 3.0000 mL | INTRAVENOUS | Status: DC | PRN

## 2020-03-30 MED ORDER — SODIUM CHLORIDE 0.9 % IV SOLN
200.0000 mg | Freq: Once | INTRAVENOUS | Status: AC
  Administered 2020-03-30: 200 mg via INTRAVENOUS
  Filled 2020-03-30: qty 40

## 2020-03-30 MED ORDER — SODIUM CHLORIDE 0.9 % IV SOLN
1000.0000 mL | INTRAVENOUS | Status: DC
  Administered 2020-03-30 – 2020-04-01 (×4): 1000 mL via INTRAVENOUS

## 2020-03-30 MED ORDER — ZINC SULFATE 220 (50 ZN) MG PO CAPS
220.0000 mg | ORAL_CAPSULE | Freq: Every day | ORAL | Status: DC
  Administered 2020-03-31 – 2020-04-20 (×20): 220 mg via ORAL
  Filled 2020-03-30 (×20): qty 1

## 2020-03-30 NOTE — ED Triage Notes (Signed)
Pt tested positive on 03-27-20. Pt came in via [facility] from home. Pt w/ hx of dementia A&Ox1 (self - baseline for pt). Pt has increasing shob, cough and fever along w/ decreased appetite and oral intake. Pt noted to be < 50% RA. EMS applied NRB at 15L which brought pt up to 88% and then to 98%. Pt is 42% RA here. Placed pt on NRB and O2 sats slowly increased to 90%. Pt is tachypneic.

## 2020-03-30 NOTE — ED Notes (Addendum)
Pt O2 85% on 35L Hi-flow at 100% fiO2 and NRB at 15L; increased NRB to 15L. Notified RT and Rathore MD

## 2020-03-30 NOTE — Progress Notes (Signed)
Patient transported to ICU without complications.

## 2020-03-30 NOTE — H&P (Signed)
History and Physical    Christina Russell HYQ:[id number] DOB: 1950/08/17 DOA: 03/30/2020  PCP: [name], MD   Patient coming from: Home  I have personally briefly reviewed patient's old medical records in [facility]  Chief Complaint: Shortness of breath Most of the history was obtained from patient's daughter Waynetta Sandy over the phone  HPI: Christina Russell is a 69 y.o. female with medical history significant for dementia who was brought into the ER by EMS for evaluation of worsening shortness of breath, cough and fever as well as poor oral intake and decreased appetite.  Patient has had symptoms since Sunday 03/23/20 but tested positive for the COVID-19 virus on 03/27/20.  All her immediate family members are also sick with the COVID-19 virus. When EMS arrived patient was noted to have room air pulse oximetry of 50%.  She was placed on a nonrebreather mask at 15 L and her pulse oximetry increased to 88%. Upon arrival to the ER her pulse oximetry on room air was 42% and she was placed back on the nonrebreather mask with improvement in her pulse oximetry to 90%.  Patient is currently on high flow nasal cannula at 15 L and her pulse oximetry is anywhere between 88 to 90%. Unable to do review of systems on this patient due to her history of dementia. Labs show sodium of 135, potassium of 3.9, bicarb of 25, BUN of 8 creatinine of 0.71, LDH 424, ferritin 496, CRP 21.9, lactic acid 1.1 and procalcitonin of 0.11. Chest x-ray reviewed by me shows cardiomegaly and diffuse bilateral airspace opacities most compatible with COVID-19 pneumonia. Twelve-lead EKG reviewed by me shows sinus rhythm with left axis deviation.      ED Course: Patient is a 69 year old female with dementia who was brought into the ER by EMS for evaluation of hypoxia and worsening shortness of breath.  Patient tested positive for COVID-19 virus on 03/27/20 and multiple family members are sick with a virus as well.  She had a  pulse oximetry on room air 42% and is currently on high flow nasal cannula 15 L with pulse oximetry anywhere from 88 to 90%. Patient is unvaccinated Chest x-ray is consistent with COVID-19 pneumonia She will be admitted to the hospital for further evaluation.  Review of Systems: As per HPI otherwise 10 point review of systems negative.    Past Medical History:  Diagnosis Date  . COVID-19 03/27/2020  . Dementia (HCC)   . HLD (hyperlipidemia)     Past Surgical History:  Procedure Laterality Date  . CATARACT EXTRACTION, BILATERAL       reports that she quit smoking about 4 years ago. Her smoking use included cigarettes. Her smokeless tobacco use includes chew. She reports previous alcohol use. She reports that she does not use drugs.  No Known Allergies  Family History  Problem Relation Age of Onset  . Cerebral aneurysm Mother   . Diabetes Sister   . Heart disease Sister   . Hypertension Sister   . Breast cancer Sister   . Breast cancer Daughter      Prior to Admission medications   Medication Sig Start Date End Date Taking? Authorizing Provider  atorvastatin (LIPITOR) 40 MG tablet Take 1 tablet (40 mg total) by mouth daily. 12/05/19  Yes Garnette Gunner, MD  dextromethorphan-guaiFENesin [facility] DM) 30-600 MG 12hr tablet Take 1 tablet by mouth 2 (two) times daily as needed for cough.   Yes [provider]  donepezil (ARICEPT) 10 MG tablet  Take 1/2 tablet daily for 2 weeks, then increase to 1 tablet daily Patient not taking: Reported on 03/30/2020 12/22/17   Van Clines, MD    Physical Exam: Vitals:   03/30/20 1439 03/30/20 1440 03/30/20 1458 03/30/20 1459  BP:  118/73    Pulse: 84 81 88 84  Resp: (!) 26 (!) 28 (!) 26 (!) 30  Temp:      TempSrc:      SpO2: 90% (!) 88% (!) 89% (!) 84%  Weight:      Height:         Vitals:   03/30/20 1439 03/30/20 1440 03/30/20 1458 03/30/20 1459  BP:  118/73    Pulse: 84 81 88 84  Resp: (!) 26 (!) 28 (!) 26 (!) 30    Temp:      TempSrc:      SpO2: 90% (!) 88% (!) 89% (!) 84%  Weight:      Height:        Constitutional: NAD, alert and oriented only to person Eyes: PERRL, lids and conjunctivae pale ENMT: Mucous membranes are dry.  Neck: normal, supple, no masses, no thyromegaly Respiratory: Bilateral air entry, no wheezing, fine crackles at the bases. Normal respiratory effort. No accessory muscle use.  Cardiovascular: Regular rate and rhythm, no murmurs / rubs / gallops. No extremity edema. 2+ pedal pulses. No carotid bruits.  Abdomen: no tenderness, no masses palpated. No hepatosplenomegaly. Bowel sounds positive.  Musculoskeletal: no clubbing / cyanosis. No joint deformity upper and lower extremities.  Skin: no rashes, lesions, ulcers.  Neurologic: No gross focal neurologic deficit. Psychiatric: Flat affect.   Labs on Admission: I have personally reviewed following labs and imaging studies  CBC: Recent Labs  Lab 03/30/20 1204  WBC 7.4  NEUTROABS 6.3  HGB 12.4  HCT 39.2  MCV 92.7  PLT 199   Basic Metabolic Panel: Recent Labs  Lab 03/30/20 1204  NA 135  K 3.9  CL 96*  CO2 25  GLUCOSE 137*  BUN 8  CREATININE 0.71  CALCIUM 8.3*   GFR: Estimated Creatinine Clearance: 58.9 mL/min (by C-G formula based on SCr of 0.71 mg/dL). Liver Function Tests: Recent Labs  Lab 03/30/20 1204  AST 38  ALT 21  ALKPHOS 48  BILITOT 0.7  PROT 7.1  ALBUMIN 2.6*   No results for input(s): LIPASE, AMYLASE in the last 168 hours. No results for input(s): AMMONIA in the last 168 hours. Coagulation Profile: No results for input(s): INR, PROTIME in the last 168 hours. Cardiac Enzymes: No results for input(s): CKTOTAL, CKMB, CKMBINDEX, TROPONINI in the last 168 hours. BNP (last 3 results) No results for input(s): PROBNP in the last 8760 hours. HbA1C: No results for input(s): HGBA1C in the last 72 hours. CBG: No results for input(s): GLUCAP in the last 168 hours. Lipid Profile: Recent Labs     03/30/20 1204  TRIG 75   Thyroid Function Tests: No results for input(s): TSH, T4TOTAL, FREET4, T3FREE, THYROIDAB in the last 72 hours. Anemia Panel: Recent Labs    03/30/20 1204  FERRITIN 496*   Urine analysis:    Component Value Date/Time   BILIRUBINUR negative 11/29/2017 1631   KETONESUR negative 11/29/2017 1631   PROTEINUR negative 11/29/2017 1631   UROBILINOGEN 0.2 11/29/2017 1631   NITRITE Positive (A) 11/29/2017 1631   LEUKOCYTESUR Small (1+) (A) 11/29/2017 1631    Radiological Exams on Admission: DG Chest Port 1 View  Result Date: 03/30/2020 CLINICAL DATA:  Patient with history  of COVID-19. Worsening shortness of breath. EXAM: PORTABLE CHEST 1 VIEW COMPARISON:  None. FINDINGS: Monitoring leads overlie the patient. Enlarged cardiac and mediastinal contours. Diffuse bilateral airspace opacities. Probable small left pleural effusion. No pneumothorax. IMPRESSION: 1. Cardiomegaly. 2. Diffuse bilateral airspace opacities most compatible with multifocal pneumonia. Electronically Signed   By: Annia Belt M.D.   On: 03/30/2020 12:51    EKG: Independently reviewed.  Sinus rhythm Left axis deviation  Assessment/Plan Principal Problem:   Pneumonia due to COVID-19 virus Active Problems:   Acute respiratory failure due to COVID-19 (HCC)   Dementia (HCC)    Pneumonia due to COVID-19 virus Patient came in for evaluation of worsening shortness of breath and tested positive for the COVID-19 virus on 03/27/20. She was also noted to be hypoxic with room air pulse oximetry in the 40s and this has improved following oxygen supplementation on 15 L high flow  Her pulse oximetry is currently between 82 and 83% We will add a nonrebreather mask to improve pulse oximetry to greater than 88% We will place patient on systemic steroids and IV remdesivir per protocol Supportive care with antipyretics and bronchodilators     Acute respiratory failure due to COVID-19 On room air at rest  patient was noted to have pulse oximetry in the 40s and this has improved with oxygen supplementation, HFNC 15L to about 82 - 83% Continue oxygen supplementation to maintain pulse oximetry greater than 88% We will add a nonrebreather mask to the high flow nasal cannula We will request critical care consult for further recommendation   Dementia Patient will require assistance with all activities of daily living        DVT prophylaxis: Lovenox Code Status: Full code Family Communication: Greater than 50% of time was spent discussing plan of care with patient's daughter Waynetta Sandy over the phone.  All questions and concerns have been addressed.  Plan of care was discussed with her in detail and she verbalizes understanding and agrees with the plan.  CODE STATUS was discussed and she wants her mother to remain a full code Disposition Plan: Back to previous home environment Consults called: None    Billijo Dilling MD [facility]     03/30/2020, 3:03 PM

## 2020-03-30 NOTE — ED Notes (Signed)
Pt noted to have desated to 74% on 15L salter O2 and 10L NRB. Pt had both in place upon assessment. Pt is tachypenic and more labored w/ accessory muscle use. Pt coached on deep breathing which brought sats up to 86%. Notified Agbata MD. Will continue to monitor.

## 2020-03-30 NOTE — ED Notes (Signed)
Per Vernona Rieger Gleason PA, hold off on CTPA at this time d/t pt not able to tolerate lying flat.

## 2020-03-30 NOTE — Progress Notes (Signed)
PCCM interval progress note:  Asked to re-evaluate patient as her oxygen requirement has increased significantly over the course of the day.  She is not requiring 40L HFNC 100% FiO2 and non-rebreather mask.  Sats 88-92%.  Pt is not in acute distress and is awake and oriented, denies CP or dyspnea.   P: -Due to high O2 requirement will admit to ICU -Continue current HFNC, if worsening is awake enough to try Bipap, but at high risk for intubation.   Darcella Gasman Lawerence Dery, PA-C

## 2020-03-30 NOTE — ED Notes (Signed)
Pt started on salter O2 per MD

## 2020-03-30 NOTE — ED Notes (Signed)
RT at bedside.

## 2020-03-30 NOTE — Consult Note (Addendum)
NAME:  Christina Russell, MRN:  [id number], DOB:  [birth year], LOS: 0 ADMISSION DATE:  03/30/2020, CONSULTATION DATE:  03/30/2020 REFERRING MD:  Dr Joylene Igo, CHIEF COMPLAINT:  Covid pneumonia   Brief History   Patient was brought into the hospital secondary to complaints of worsening shortness of breath Tested positive for the Covid 03/27/2020 History of present illness    She says she feels well Has been having shortness of breath, cough, fever  Other family members with Covid  She is not vaccinated  Underlying history of dementia  Past Medical History   Past Medical History:  Diagnosis Date  . COVID-19 03/27/2020  . Dementia (HCC)   . HLD (hyperlipidemia)    Significant Hospital Events     Consults:  pccm  Procedures:  none  Significant Diagnostic Tests:  03/30/2020 chest x-ray-bibasilar multifocal infiltrate-reviewed by myself  Micro Data:  Blood culture 03/30/2020  Antimicrobials:  received remdesivir  Interim history/subjective:  She does appear comfortable  Objective   Blood pressure 120/72, pulse 73, temperature 98.3 F (36.8 C), temperature source Oral, resp. rate (!) 23, height 5\' 2"  (1.575 m), weight 65.3 kg, SpO2 96 %.       No intake or output data in the 24 hours ending 03/30/20 1651 Filed Weights   03/30/20 1142  Weight: 65.3 kg    Examination: General: Elderly lady, does appear comfortable HENT: Moist oral mucosa Lungs: Decreased air movement bilaterally Cardiovascular: S1-S2 appreciated Abdomen: Bowel sounds appreciated Extremities: No clubbing, no edema Neuro: Alert and oriented x2 GU:   Chest x-ray reviewed by myself with multifocal bibasilar infiltrate Resolved Hospital Problem list     Assessment & Plan:  Covid pneumonia -On remdesivir and Decadron -Add olumiant  Acute hypoxemic respiratory failure -Continue oxygen supplementation -Supplementation to maintain saturations greater than 85% -Encourage proning  Continue other  lines of treatment for dementia, hyperlipidemia  I did reach out to the daughter and spoke with her about ongoing status of her mom   Patient is stable enough to go to the medical floor, monitor closely   CBC: Recent Labs  Lab 03/30/20 1204  WBC 7.4  NEUTROABS 6.3  HGB 12.4  HCT 39.2  MCV 92.7  PLT 199    Basic Metabolic Panel: Recent Labs  Lab 03/30/20 1204  NA 135  K 3.9  CL 96*  CO2 25  GLUCOSE 137*  BUN 8  CREATININE 0.71  CALCIUM 8.3*   GFR: Estimated Creatinine Clearance: 58.9 mL/min (by C-G formula based on SCr of 0.71 mg/dL). Recent Labs  Lab 03/30/20 1201 03/30/20 1204 03/30/20 1408  PROCALCITON  --  0.11  --   WBC  --  7.4  --   LATICACIDVEN 2.2*  --  1.1    Liver Function Tests: Recent Labs  Lab 03/30/20 1204  AST 38  ALT 21  ALKPHOS 48  BILITOT 0.7  PROT 7.1  ALBUMIN 2.6*   No results for input(s): LIPASE, AMYLASE in the last 168 hours. No results for input(s): AMMONIA in the last 168 hours.  ABG No results found for: PHART, PCO2ART, PO2ART, HCO3, TCO2, ACIDBASEDEF, O2SAT   Coagulation Profile: No results for input(s): INR, PROTIME in the last 168 hours.  Cardiac Enzymes: No results for input(s): CKTOTAL, CKMB, CKMBINDEX, TROPONINI in the last 168 hours.  HbA1C: Hemoglobin A1C  Date/Time Value Ref Range Status  04/15/2017 04:16 PM 5.3  Final    CBG: No results for input(s): GLUCAP in the last 168 hours.  Review of  Systems:   Denies pain or discomfort Denies headache  Past Medical History  She,  has a past medical history of COVID-19 (03/27/2020), Dementia (HCC), and HLD (hyperlipidemia).   Surgical History    Past Surgical History:  Procedure Laterality Date  . CATARACT EXTRACTION, BILATERAL       Social History   reports that she quit smoking about 4 years ago. Her smoking use included cigarettes. Her smokeless tobacco use includes chew. She reports previous alcohol use. She reports that she does not use drugs.     Family History   Her family history includes Breast cancer in her daughter and sister; Cerebral aneurysm in her mother; Diabetes in her sister; Heart disease in her sister; Hypertension in her sister.   Allergies No Known Allergies   Home Medications  Prior to Admission medications   Medication Sig Start Date End Date Taking? Authorizing Provider  atorvastatin (LIPITOR) 40 MG tablet Take 1 tablet (40 mg total) by mouth daily. 12/05/19  Yes Garnette Gunner, MD  dextromethorphan-guaiFENesin [facility] DM) 30-600 MG 12hr tablet Take 1 tablet by mouth 2 (two) times daily as needed for cough.   Yes [provider]  donepezil (ARICEPT) 10 MG tablet Take 1/2 tablet daily for 2 weeks, then increase to 1 tablet daily Patient not taking: Reported on 03/30/2020 12/22/17   Van Clines, MD    The patient is critically ill with multiple organ systems failure and requires high complexity decision making for assessment and support, frequent evaluation and titration of therapies, application of advanced monitoring technologies and extensive interpretation of multiple databases. Critical Care Time devoted to patient care services described in this note independent of APP/resident time (if applicable)  is 30 minutes.   Virl Diamond MD [facility] Personal pager: [phone] If unanswered, please page CCM On-call: #[phone]

## 2020-03-30 NOTE — ED Notes (Signed)
Increased [place]ter O2 to 15L; pt maintaining 87-88%; RT and MD aware

## 2020-03-30 NOTE — ED Notes (Signed)
Date and time results received: 03/30/20 1243  Test: lactic acid Critical Value: 2.2  Name of Provider Notified: Deretha Emory MD

## 2020-03-30 NOTE — ED Provider Notes (Signed)
[facility] EMERGENCY DEPARTMENT Provider Note   CSN: [id number] Arrival date & time: 03/30/20  1121     History Chief Complaint  Patient presents with  . Covid/ Distress  . COVID positive    Christina Russell is a 69 y.o. female.  Patient brought in by EMS.  According to the information negative from the family patient tested positive for Covid on August 12.  Patient has a history of dementia.  And family member stated patient is baseline.  She is increasing shortness of breath cough and fever along with decreased appetite and oral intake.  This is what prompted EMS to be called.  When EMS got there her oxygen saturations on room air were below 50%.  Patient started on 15 L nonrebreather and her sats supposedly came up into the 90% range.  But she arrived here at about 88%.  Patient appeared in no distress whatsoever.  EMS said even for she was started on oxygen that she appeared in no distress.        Past Medical History:  Diagnosis Date  . COVID-19 03/27/2020  . Dementia (HCC)   . HLD (hyperlipidemia)     Patient Active Problem List   Diagnosis Date Noted  . Acute respiratory failure due to COVID-19 (HCC) 03/30/2020  . Pneumonia due to COVID-19 virus 03/30/2020  . Dementia (HCC)   . Hyperlipidemia 12/05/2019  . Estrogen deficiency 10/20/2018  . Urinary frequency 11/30/2017  . Trigger middle finger of left hand 11/30/2017  . Seizures (HCC) 11/29/2017    Past Surgical History:  Procedure Laterality Date  . CATARACT EXTRACTION, BILATERAL       OB History   No obstetric history on file.     Family History  Problem Relation Age of Onset  . Cerebral aneurysm Mother   . Diabetes Sister   . Heart disease Sister   . Hypertension Sister   . Breast cancer Sister   . Breast cancer Daughter     Social History   Tobacco Use  . Smoking status: Former Smoker    Types: Cigarettes    Quit date: 2017    Years since quitting: 4.6  . Smokeless  tobacco: Current User    Types: Chew  Vaping Use  . Vaping Use: Never used  Substance Use Topics  . Alcohol use: Not Currently  . Drug use: Never    Home Medications Prior to Admission medications   Medication Sig Start Date End Date Taking? Authorizing Provider  atorvastatin (LIPITOR) 40 MG tablet Take 1 tablet (40 mg total) by mouth daily. 12/05/19  Yes Garnette Gunner, MD  dextromethorphan-guaiFENesin [facility] DM) 30-600 MG 12hr tablet Take 1 tablet by mouth 2 (two) times daily as needed for cough.   Yes [provider]  donepezil (ARICEPT) 10 MG tablet Take 1/2 tablet daily for 2 weeks, then increase to 1 tablet daily Patient not taking: Reported on 03/30/2020 12/22/17   Van Clines, MD    Allergies    Patient has no known allergies.  Review of Systems   Review of Systems  Unable to perform ROS: Dementia    Physical Exam Updated Vital Signs BP 124/77 (BP Location: Left Arm)   Pulse 72   Temp 98.3 F (36.8 C) (Oral)   Resp (!) 27   Ht 1.575 m (5\' 2" )   Wt 65.3 kg   SpO2 94%   BMI 26.33 kg/m   Physical Exam Vitals and nursing note reviewed.  Constitutional:  General: She is not in acute distress.    Appearance: Normal appearance. She is well-developed.  HENT:     Head: Normocephalic and atraumatic.  Eyes:     Extraocular Movements: Extraocular movements intact.     Conjunctiva/sclera: Conjunctivae normal.     Pupils: Pupils are equal, round, and reactive to light.  Cardiovascular:     Rate and Rhythm: Normal rate and regular rhythm.     Heart sounds: No murmur heard.   Pulmonary:     Effort: Pulmonary effort is normal. No respiratory distress.     Breath sounds: Normal breath sounds. No wheezing.  Abdominal:     Palpations: Abdomen is soft.     Tenderness: There is no abdominal tenderness.  Musculoskeletal:        General: Normal range of motion.     Cervical back: Normal range of motion and neck supple.  Skin:    General: Skin is warm  and dry.  Neurological:     Mental Status: She is alert. Mental status is at baseline.     ED Results / Procedures / Treatments   Labs (all labs ordered are listed, but only abnormal results are displayed) Labs Reviewed  SARS CORONAVIRUS 2 BY RT PCR (HOSPITAL ORDER, PERFORMED IN [facility]) - Abnormal; Notable for the following components:      Result Value   SARS Coronavirus 2 POSITIVE (*)    All other components within normal limits  LACTIC ACID, PLASMA - Abnormal; Notable for the following components:   Lactic Acid, Venous 2.2 (*)    All other components within normal limits  CBC WITH DIFFERENTIAL/PLATELET - Abnormal; Notable for the following components:   Lymphs Abs 0.6 (*)    All other components within normal limits  COMPREHENSIVE METABOLIC PANEL - Abnormal; Notable for the following components:   Chloride 96 (*)    Glucose, Bld 137 (*)    Calcium 8.3 (*)    Albumin 2.6 (*)    All other components within normal limits  D-DIMER, QUANTITATIVE (NOT AT [facility]) - Abnormal; Notable for the following components:   D-Dimer, Quant 2.43 (*)    All other components within normal limits  LACTATE DEHYDROGENASE - Abnormal; Notable for the following components:   LDH 424 (*)    All other components within normal limits  FERRITIN - Abnormal; Notable for the following components:   Ferritin 496 (*)    All other components within normal limits  FIBRINOGEN - Abnormal; Notable for the following components:   Fibrinogen 785 (*)    All other components within normal limits  C-REACTIVE PROTEIN - Abnormal; Notable for the following components:   CRP 21.9 (*)    All other components within normal limits  CULTURE, BLOOD (ROUTINE X 2)  CULTURE, BLOOD (ROUTINE X 2)  LACTIC ACID, PLASMA  PROCALCITONIN  TRIGLYCERIDES  HIV ANTIBODY (ROUTINE TESTING W REFLEX)  CBC WITH DIFFERENTIAL/PLATELET  COMPREHENSIVE METABOLIC PANEL  C-REACTIVE PROTEIN  D-DIMER, QUANTITATIVE (NOT AT [facility])    FERRITIN  MAGNESIUM  PHOSPHORUS  ABO/RH    EKG EKG Interpretation  Date/Time:  Sunday March 30 2020 11:56:19 EDT Ventricular Rate:  93 PR Interval:    QRS Duration: 98 QT Interval:  363 QTC Calculation: 452 R Axis:   -32 Text Interpretation: Sinus rhythm Probable left atrial enlargement Left axis deviation RSR' in V1 or V2, right VCD or RVH No previous ECGs available Confirmed by Vanetta Mulders [phone]) on 03/30/2020 12:33:37 PM   Radiology  DG Chest Port 1 View  Result Date: 03/30/2020 CLINICAL DATA:  Patient with history of COVID-19. Worsening shortness of breath. EXAM: PORTABLE CHEST 1 VIEW COMPARISON:  None. FINDINGS: Monitoring leads overlie the patient. Enlarged cardiac and mediastinal contours. Diffuse bilateral airspace opacities. Probable small left pleural effusion. No pneumothorax. IMPRESSION: 1. Cardiomegaly. 2. Diffuse bilateral airspace opacities most compatible with multifocal pneumonia. Electronically Signed   By: Annia Belt M.D.   On: 03/30/2020 12:51    Procedures Procedures (including critical care time)  CRITICAL CARE Performed by: Vanetta Mulders Total critical care time: 45 minutes Critical care time was exclusive of separately billable procedures and treating other patients. Critical care was necessary to treat or prevent imminent or life-threatening deterioration. Critical care was time spent personally by me on the following activities: development of treatment plan with patient and/or surrogate as well as nursing, discussions with consultants, evaluation of patient's response to treatment, examination of patient, obtaining history from patient or surrogate, ordering and performing treatments and interventions, ordering and review of laboratory studies, ordering and review of radiographic studies, pulse oximetry and re-evaluation of patient's condition.   Medications Ordered in ED Medications  0.9 %  sodium chloride infusion (1,000 mLs Intravenous New  Bag/Given 03/30/20 1213)  atorvastatin (LIPITOR) tablet 40 mg (40 mg Oral Not Given 03/30/20 1559)  enoxaparin (LOVENOX) injection 40 mg (40 mg Subcutaneous Given 03/30/20 1629)  sodium chloride flush (NS) 0.9 % injection 3 mL (3 mLs Intravenous Not Given 03/30/20 1510)  sodium chloride flush (NS) 0.9 % injection 3 mL (has no administration in time range)  0.9 %  sodium chloride infusion (has no administration in time range)  remdesivir 200 mg in sodium chloride 0.9% 250 mL IVPB (200 mg Intravenous New Bag/Given 03/30/20 1625)    Followed by  remdesivir 100 mg in sodium chloride 0.9 % 100 mL IVPB (has no administration in time range)  albuterol (VENTOLIN HFA) 108 (90 Base) MCG/ACT inhaler 2 puff (2 puffs Inhalation Not Given 03/30/20 1600)  dexamethasone (DECADRON) injection 6 mg (6 mg Intravenous Given 03/30/20 1626)  chlorpheniramine-HYDROcodone (TUSSIONEX) 10-8 MG/5ML suspension 5 mL (has no administration in time range)  ascorbic acid (VITAMIN C) tablet 500 mg (500 mg Oral Not Given 03/30/20 1600)  zinc sulfate capsule 220 mg (220 mg Oral Not Given 03/30/20 1600)  acetaminophen (TYLENOL) tablet 650 mg (has no administration in time range)  ondansetron (ZOFRAN) tablet 4 mg (has no administration in time range)    Or  ondansetron (ZOFRAN) injection 4 mg (has no administration in time range)    ED Course  I have reviewed the triage vital signs and the nursing notes.  Pertinent labs & imaging results that were available during my care of the patient were reviewed by me and considered in my medical decision making (see chart for details).    MDM Rules/Calculators/A&P                         Chest x-ray consistent with multifocal pneumonia.  Patient on 15 L nonrebreather was satting right around 90%.  We did do a trial of high flow humidified nasal cannula at 15 L.  This did improve patient's oxygen ranged anywhere from 88 to 92%.  And the seem to be working for a while.  Initially discussed with  critical care and they felt that that she was probably oxygen wise and and comfortable enough to go to the hospitalist service.  They were contacted.  They saw the patient.  Patient's oxygen sats were dropping down to around 85%.  So 100% nonrebreather was added and patient's oxygen sats now or around 90%.  But they did not feel comfortable admitting patient so critical care was recontacted they saw the patient in person.  And they feel that can go to the hospitalist service they did give the hospitalist to call.  Hospitalist service will admit.  Bello patient has a dementia.  She does appear comfortable she has removed her mask her oxygen a few times but will follow commands and put it back on.  Lactic acid not elevated.  Patient's Covid parameter labs are suggestive of Covid infection.  No Covid test in our system so she was retested and is positive.  Final Clinical Impression(s) / ED Diagnoses Final diagnoses:  Acute respiratory failure with hypoxia (HCC)  COVID-19 virus infection    Rx / DC Orders ED Discharge Orders    None       Vanetta Mulders, MD 03/30/20 1713

## 2020-03-30 NOTE — Progress Notes (Addendum)
Floor coverage   Patient admitted 8/15 for acute hypoxemic respiratory failure secondary to COVID-19 viral pneumonia.  D-dimer 2.43.  CRP 21.9.  Procalcitonin 0.11.  8:25 PM: Patient satting 83-84% on 15 L salter and 13 L nonrebreather. Respiratory rate around 20, no increased work of breathing. Not tachycardic, heart rate in the 70s.  ABG with PO2 49. She was placed on 35 L heated high flow nasal cannula and nonrebreather with improvement in oxygen saturation to 93%.    10 PM: Patient is currently on 40 L heated high flow nasal cannula, FiO2 100% and 15 L nonrebreather.  Continues to be hypoxic with oxygen saturation in the low to mid 80s.  Patient has already received Decadron, remdesivir, and baricitinib.  Plan -D-dimer elevated at 2.43, will order CT angiogram to rule out PE. -Encourage proning -Supplemental oxygen to maintain oxygen saturation at least above 85% -I believe the patient will require ICU admission due to her worsening hypoxemia.  Discussed with Dr. Levada Schilling, critical care team will see the patient.

## 2020-03-30 NOTE — ED Notes (Signed)
RN placed pt on 10L NRB in addition to 15L on salter O2, per MD request. SpO2 now 91%

## 2020-03-30 NOTE — ED Notes (Addendum)
Notified critical care MD that pt O2 on RA desats to 37-40's. Notified provider that pt repeatedly removes NRB and [place]. Also notified provider that pt has continued to be tacypneic and O2 has continued to maintain around 92% even on NRB and salter O2.

## 2020-03-30 NOTE — ED Notes (Signed)
Per Rathore MD, call RT for STAT ABG and heated hi-flow w/ NRB. Notified RT.

## 2020-03-30 NOTE — ED Notes (Signed)
Per critical care MD, goal for pt's SpO2 sat is above 85%

## 2020-03-30 NOTE — ED Notes (Addendum)
Pt O2 desaturated to 76% w/ NRB and salter O2 both in place. NRB turned up to 13L. Pt now 84% on 13L NRB and 15 salter O2. Notified Agbata MD and Rathore MD. Will continue to monitor.

## 2020-03-30 NOTE — ED Notes (Signed)
Pt removed oxygen, SpO2 decreased to 64% on RA. RN put pt back on salter O2 at 15L, SpO2 slowly increasing, now 86%

## 2020-03-30 NOTE — ED Notes (Addendum)
Pt noted to be scooting down to the end of the bed. [place] noted to have been taken off as well. Pt O2 66%. Reaplied O2 via [place] and instructed pt to take deep breaths. Pt tachypneic and labored at 37 RR. Agbata MD notified

## 2020-03-30 NOTE — ED Notes (Signed)
Pt now 86-88% on 10L NRB and 15L salter O2

## 2020-03-30 NOTE — ED Notes (Signed)
Critical care providers at bedside.  

## 2020-03-31 ENCOUNTER — Inpatient Hospital Stay (HOSPITAL_COMMUNITY): Payer: Medicare Other

## 2020-03-31 DIAGNOSIS — I361 Nonrheumatic tricuspid (valve) insufficiency: Secondary | ICD-10-CM | POA: Diagnosis not present

## 2020-03-31 DIAGNOSIS — I340 Nonrheumatic mitral (valve) insufficiency: Secondary | ICD-10-CM | POA: Diagnosis not present

## 2020-03-31 DIAGNOSIS — J9600 Acute respiratory failure, unspecified whether with hypoxia or hypercapnia: Secondary | ICD-10-CM | POA: Diagnosis not present

## 2020-03-31 DIAGNOSIS — J1282 Pneumonia due to Coronavirus disease 2019: Secondary | ICD-10-CM | POA: Diagnosis not present

## 2020-03-31 DIAGNOSIS — U071 COVID-19: Secondary | ICD-10-CM | POA: Diagnosis not present

## 2020-03-31 LAB — COMPREHENSIVE METABOLIC PANEL
ALT: 22 U/L (ref 0–44)
AST: 33 U/L (ref 15–41)
Albumin: 2.1 g/dL — ABNORMAL LOW (ref 3.5–5.0)
Alkaline Phosphatase: 52 U/L (ref 38–126)
Anion gap: 11 (ref 5–15)
BUN: 10 mg/dL (ref 8–23)
CO2: 25 mmol/L (ref 22–32)
Calcium: 7.9 mg/dL — ABNORMAL LOW (ref 8.9–10.3)
Chloride: 104 mmol/L (ref 98–111)
Creatinine, Ser: 0.61 mg/dL (ref 0.44–1.00)
GFR calc Af Amer: >60 mL/min (ref >60)
GFR calc non Af Amer: >60 mL/min (ref >60)
Glucose, Bld: 131 mg/dL — ABNORMAL HIGH (ref 70–99)
Potassium: 4 mmol/L (ref 3.5–5.1)
Sodium: 140 mmol/L (ref 135–145)
Total Bilirubin: 0.7 mg/dL (ref 0.3–1.2)
Total Protein: 6.6 g/dL (ref 6.5–8.1)

## 2020-03-31 LAB — ECHOCARDIOGRAM LIMITED
Area-P 1/2: 4.21 cm2
Height: 62 in
Weight: 2275.15 oz

## 2020-03-31 LAB — GLUCOSE, CAPILLARY: Glucose-Capillary: 119 mg/dL — ABNORMAL HIGH (ref 70–99)

## 2020-03-31 LAB — CBC WITH DIFFERENTIAL/PLATELET
Abs Immature Granulocytes: 0.09 10*3/uL — ABNORMAL HIGH (ref 0.00–0.07)
Basophils Absolute: 0 10*3/uL (ref 0.0–0.1)
Basophils Relative: 0 %
Eosinophils Absolute: 0 10*3/uL (ref 0.0–0.5)
Eosinophils Relative: 0 %
HCT: 36.3 % (ref 36.0–46.0)
Hemoglobin: 11.8 g/dL — ABNORMAL LOW (ref 12.0–15.0)
Immature Granulocytes: 2 %
Lymphocytes Relative: 10 %
Lymphs Abs: 0.6 10*3/uL — ABNORMAL LOW (ref 0.7–4.0)
MCH: 30 pg (ref 26.0–34.0)
MCHC: 32.5 g/dL (ref 30.0–36.0)
MCV: 92.4 fL (ref 80.0–100.0)
Monocytes Absolute: 0.3 10*3/uL (ref 0.1–1.0)
Monocytes Relative: 6 %
Neutro Abs: 4.7 10*3/uL (ref 1.7–7.7)
Neutrophils Relative %: 82 %
Platelets: 204 10*3/uL (ref 150–400)
RBC: 3.93 MIL/uL (ref 3.87–5.11)
RDW: 14 % (ref 11.5–15.5)
WBC: 5.6 10*3/uL (ref 4.0–10.5)
nRBC: 0.4 % — ABNORMAL HIGH (ref 0.0–0.2)

## 2020-03-31 LAB — MAGNESIUM: Magnesium: 2.3 mg/dL (ref 1.7–2.4)

## 2020-03-31 LAB — D-DIMER, QUANTITATIVE: D-Dimer, Quant: 3.48 ug/mL-FEU — ABNORMAL HIGH (ref 0.00–0.50)

## 2020-03-31 LAB — FERRITIN: Ferritin: 459 ng/mL — ABNORMAL HIGH (ref 11–307)

## 2020-03-31 LAB — PHOSPHORUS: Phosphorus: 3.1 mg/dL (ref 2.5–4.6)

## 2020-03-31 LAB — MRSA PCR SCREENING: MRSA by PCR: NEGATIVE

## 2020-03-31 LAB — C-REACTIVE PROTEIN: CRP: 21.8 mg/dL — ABNORMAL HIGH (ref <1.0)

## 2020-03-31 MED ORDER — METHYLPREDNISOLONE SODIUM SUCC 40 MG IJ SOLR
40.0000 mg | Freq: Two times a day (BID) | INTRAMUSCULAR | Status: DC
  Administered 2020-03-31 – 2020-04-02 (×6): 40 mg via INTRAVENOUS
  Filled 2020-03-31 (×6): qty 1

## 2020-03-31 MED ORDER — ORAL CARE MOUTH RINSE
15.0000 mL | Freq: Two times a day (BID) | OROMUCOSAL | Status: DC
  Administered 2020-03-31 – 2020-04-20 (×37): 15 mL via OROMUCOSAL

## 2020-03-31 MED ORDER — FUROSEMIDE 10 MG/ML IJ SOLN
40.0000 mg | Freq: Four times a day (QID) | INTRAMUSCULAR | Status: AC
  Administered 2020-03-31 (×2): 40 mg via INTRAVENOUS
  Filled 2020-03-31 (×2): qty 4

## 2020-03-31 MED ORDER — DONEPEZIL HCL 10 MG PO TABS
10.0000 mg | ORAL_TABLET | Freq: Every day | ORAL | Status: DC
  Administered 2020-03-31 – 2020-04-06 (×7): 10 mg via ORAL
  Filled 2020-03-31 (×8): qty 1

## 2020-03-31 NOTE — Progress Notes (Signed)
NAME:  Christina Russell, MRN:  [id number], DOB:  [birth year], LOS: 1 ADMISSION DATE:  03/30/2020, CONSULTATION DATE:  8/15 REFERRING MD:  Agbata, CHIEF COMPLAINT:  COVID pneumonia   Brief History   69 y/o female tested positive for COVID 19 pneumonia on 8/12, brought to the ER on 8/15 in setting of worsening dyspnea.  Past Medical History  Dementia Hyperlipidemia  Significant Hospital Events   8/15 admission 8/16 to ICU  Consults:  PCCM  Procedures:    Significant Diagnostic Tests:    Micro Data:  8/15 SARS COV 2 > positive 8/15 blood >   Antimicrobials/COVID Rx:   8/15 remdesivir >  8/15 baricitinib >  8/15 solumedrol >   Interim history/subjective:  Feels better Oxygenation slightly better Would like to eat  Objective   Blood pressure 126/73, pulse 75, temperature 98.5 F (36.9 C), temperature source Axillary, resp. rate (!) 35, height 5\' 2"  (1.575 m), weight 64.5 kg, SpO2 92 %.    FiO2 (%):  [100 %] 100 %   Intake/Output Summary (Last 24 hours) at 03/31/2020 0755 Last data filed at 03/31/2020 0600 Gross per 24 hour  Intake 1571.26 ml  Output --  Net 1571.26 ml   Filed Weights   03/30/20 1142 03/31/20 0000 03/31/20 0416  Weight: 65.3 kg 64.3 kg 64.5 kg    Examination:  General:  Resting comfortably in bed HENT: NCAT OP clear PULM: Crackles bases B, normal effort CV: RRR, no mgr GI: BS+, soft, nontender MSK: normal bulk and tone Neuro: awake, alert, no distress, MAEW  8/16 CXR reviewed> cardiomegally, bilateral interstitial infiltrates  Resolved Hospital Problem list     Assessment & Plan:  Acute hypoxemic respiratory failure due to COVID pneumonia Possible superimposed pulmonary edema Tolerate periods of hypoxemia, goal at rest is greater than 85% SaO2, with movement ideally above 75% Decision for intubation should be based on a change in mental status or physical evidence of ventilatory failure such as nasal flaring, accessory muscle use,  paradoxical breathing Out of bed to chair as able Incentive spirometry is important, use every hour Prone positioning while in bed 8/16> echo, lasix, solumedrol, remdesivir, advance diet, out of bed, baricitinib  Dementia Frequent orientation Restart namenda   Best practice:  Diet: advance diet Pain/Anxiety/Delirium protocol (if indicated): n/a VAP protocol (if indicated): n/a DVT prophylaxis: lovenox GI prophylaxis: n/a Glucose control: monitor Mobility: out of bed Code Status: full Family Communication: I tried calling 04/02/20 (listed as primary contact) on 8/16, no answer, left message Disposition: remain in ICU  Labs   CBC: Recent Labs  Lab 03/30/20 1204 03/30/20 2104 03/31/20 0337  WBC 7.4  --  5.6  NEUTROABS 6.3  --  4.7  HGB 12.4 11.2* 11.8*  HCT 39.2 33.0* 36.3  MCV 92.7  --  92.4  PLT 199  --  204    Basic Metabolic Panel: Recent Labs  Lab 03/30/20 1204 03/30/20 2104 03/31/20 0337  NA 135 139 140  K 3.9 3.9 4.0  CL 96*  --  104  CO2 25  --  25  GLUCOSE 137*  --  131*  BUN 8  --  10  CREATININE 0.71  --  0.61  CALCIUM 8.3*  --  7.9*  MG  --   --  2.3  PHOS  --   --  3.1   GFR: Estimated Creatinine Clearance: 58.6 mL/min (by C-G formula based on SCr of 0.61 mg/dL). Recent Labs  Lab 03/30/20 1201 03/30/20 1204  03/30/20 1408 03/31/20 0337  PROCALCITON  --  0.11  --   --   WBC  --  7.4  --  5.6  LATICACIDVEN 2.2*  --  1.1  --     Liver Function Tests: Recent Labs  Lab 03/30/20 1204 03/31/20 0337  AST 38 33  ALT 21 22  ALKPHOS 48 52  BILITOT 0.7 0.7  PROT 7.1 6.6  ALBUMIN 2.6* 2.1*   No results for input(s): LIPASE, AMYLASE in the last 168 hours. No results for input(s): AMMONIA in the last 168 hours.  ABG    Component Value Date/Time   PHART 7.468 (H) 03/30/2020 2104   PCO2ART 39.2 03/30/2020 2104   PO2ART 49 (L) 03/30/2020 2104   HCO3 28.4 (H) 03/30/2020 2104   TCO2 30 03/30/2020 2104   O2SAT 87.0 03/30/2020 2104       Coagulation Profile: No results for input(s): INR, PROTIME in the last 168 hours.  Cardiac Enzymes: No results for input(s): CKTOTAL, CKMB, CKMBINDEX, TROPONINI in the last 168 hours.  HbA1C: Hemoglobin A1C  Date/Time Value Ref Range Status  04/15/2017 04:16 PM 5.3  Final    CBG: Recent Labs  Lab 03/31/20 0003  GLUCAP 119*     Critical care time: 30 minutes    Heber Toad Hop, MD [place] [facility] Pager: [phone] Cell: [phone] If no response, call [phone]

## 2020-03-31 NOTE — Progress Notes (Signed)
Echocardiogram 2D Echocardiogram has been performed.  Katherene Dinino F Meli Faley 03/31/2020, 3:05 PM 

## 2020-04-01 DIAGNOSIS — U071 COVID-19: Secondary | ICD-10-CM | POA: Diagnosis not present

## 2020-04-01 DIAGNOSIS — J1282 Pneumonia due to Coronavirus disease 2019: Secondary | ICD-10-CM | POA: Diagnosis not present

## 2020-04-01 DIAGNOSIS — J9600 Acute respiratory failure, unspecified whether with hypoxia or hypercapnia: Secondary | ICD-10-CM | POA: Diagnosis not present

## 2020-04-01 LAB — COMPREHENSIVE METABOLIC PANEL
ALT: 22 U/L (ref 0–44)
AST: 26 U/L (ref 15–41)
Albumin: 2.1 g/dL — ABNORMAL LOW (ref 3.5–5.0)
Alkaline Phosphatase: 55 U/L (ref 38–126)
Anion gap: 10 (ref 5–15)
BUN: 15 mg/dL (ref 8–23)
CO2: 29 mmol/L (ref 22–32)
Calcium: 7.8 mg/dL — ABNORMAL LOW (ref 8.9–10.3)
Chloride: 104 mmol/L (ref 98–111)
Creatinine, Ser: 0.62 mg/dL (ref 0.44–1.00)
GFR calc Af Amer: >60 mL/min (ref >60)
GFR calc non Af Amer: >60 mL/min (ref >60)
Glucose, Bld: 149 mg/dL — ABNORMAL HIGH (ref 70–99)
Potassium: 3.7 mmol/L (ref 3.5–5.1)
Sodium: 143 mmol/L (ref 135–145)
Total Bilirubin: 0.7 mg/dL (ref 0.3–1.2)
Total Protein: 6.6 g/dL (ref 6.5–8.1)

## 2020-04-01 LAB — CBC WITH DIFFERENTIAL/PLATELET
Abs Immature Granulocytes: 0 10*3/uL (ref 0.00–0.07)
Basophils Absolute: 0 10*3/uL (ref 0.0–0.1)
Basophils Relative: 0 %
Eosinophils Absolute: 0 10*3/uL (ref 0.0–0.5)
Eosinophils Relative: 0 %
HCT: 37.3 % (ref 36.0–46.0)
Hemoglobin: 12.1 g/dL (ref 12.0–15.0)
Lymphocytes Relative: 8 %
Lymphs Abs: 0.6 10*3/uL — ABNORMAL LOW (ref 0.7–4.0)
MCH: 29.6 pg (ref 26.0–34.0)
MCHC: 32.4 g/dL (ref 30.0–36.0)
MCV: 91.2 fL (ref 80.0–100.0)
Monocytes Absolute: 0.2 10*3/uL (ref 0.1–1.0)
Monocytes Relative: 3 %
Neutro Abs: 7.1 10*3/uL (ref 1.7–7.7)
Neutrophils Relative %: 89 %
Platelets: 185 10*3/uL (ref 150–400)
RBC: 4.09 MIL/uL (ref 3.87–5.11)
RDW: 13.8 % (ref 11.5–15.5)
WBC: 8 10*3/uL (ref 4.0–10.5)
nRBC: 0 /100 WBC
nRBC: 0.3 % — ABNORMAL HIGH (ref 0.0–0.2)

## 2020-04-01 LAB — PHOSPHORUS: Phosphorus: 2.8 mg/dL (ref 2.5–4.6)

## 2020-04-01 LAB — MAGNESIUM: Magnesium: 2.5 mg/dL — ABNORMAL HIGH (ref 1.7–2.4)

## 2020-04-01 LAB — FERRITIN: Ferritin: 474 ng/mL — ABNORMAL HIGH (ref 11–307)

## 2020-04-01 LAB — C-REACTIVE PROTEIN: CRP: 15.1 mg/dL — ABNORMAL HIGH (ref <1.0)

## 2020-04-01 LAB — D-DIMER, QUANTITATIVE: D-Dimer, Quant: >20 ug/mL-FEU — ABNORMAL HIGH (ref 0.00–0.50)

## 2020-04-01 MED ORDER — FUROSEMIDE 10 MG/ML IJ SOLN
40.0000 mg | Freq: Four times a day (QID) | INTRAMUSCULAR | Status: AC
  Administered 2020-04-01 (×2): 40 mg via INTRAVENOUS
  Filled 2020-04-01 (×2): qty 4

## 2020-04-01 MED ORDER — METOPROLOL TARTRATE 12.5 MG HALF TABLET
12.5000 mg | ORAL_TABLET | Freq: Two times a day (BID) | ORAL | Status: DC
  Administered 2020-04-01 – 2020-04-19 (×36): 12.5 mg via ORAL
  Filled 2020-04-01 (×36): qty 1

## 2020-04-01 MED ORDER — ALBUTEROL SULFATE HFA 108 (90 BASE) MCG/ACT IN AERS
2.0000 | INHALATION_SPRAY | Freq: Three times a day (TID) | RESPIRATORY_TRACT | Status: DC
  Administered 2020-04-01 – 2020-04-20 (×55): 2 via RESPIRATORY_TRACT
  Filled 2020-04-01 (×2): qty 6.7

## 2020-04-01 NOTE — Progress Notes (Signed)
NAME:  Christina Russell, MRN:  [id number], DOB:  1950/10/15, LOS: 2 ADMISSION DATE:  03/30/2020, CONSULTATION DATE:  8/15 REFERRING MD:  Agbata, CHIEF COMPLAINT:  COVID pneumonia   Brief History   69 y/o female tested positive for COVID 19 pneumonia on 8/12, brought to the ER on 8/15 in setting of worsening dyspnea.  Past Medical History  Dementia Hyperlipidemia  Significant Hospital Events   8/15 admission 8/16 to ICU  Consults:  PCCM  Procedures:    Significant Diagnostic Tests:  8/16 TTE> LVEF 50-55%, normal PA pressure estimate  Micro Data:  8/15 SARS COV 2 > positive 8/15 blood >   Antimicrobials/COVID Rx:   8/15 remdesivir >  8/15 baricitinib >  8/15 solumedrol >   Interim history/subjective:   feels better No edema on exam Remains on HHF   Objective   Blood pressure 139/75, pulse 63, temperature 97.9 F (36.6 C), temperature source Axillary, resp. rate 16, height 5\' 2"  (1.575 m), weight 66.3 kg, SpO2 91 %.    FiO2 (%):  [100 %] 100 %   Intake/Output Summary (Last 24 hours) at 04/01/2020 0815 Last data filed at 04/01/2020 0600 Gross per 24 hour  Intake 1862.76 ml  Output 3400 ml  Net -1537.24 ml   Filed Weights   03/31/20 0000 03/31/20 0416 04/01/20 0449  Weight: 64.3 kg 64.5 kg 66.3 kg    Examination:  General:  Resting comfortably in bed HENT: NCAT OP clear PULM: Crackles bases B, normal effort CV: RRR, no mgr GI: BS+, soft, nontender MSK: normal bulk and tone Neuro: awake, alert, no distress, MAEW   8/15 CXR reviewed> cardiomegally, bilateral interstitial infiltrates   Resolved Hospital Problem list     Assessment & Plan:  Acute hypoxemic respiratory failure due to COVID pneumonia Possible superimposed pulmonary edema Tolerate periods of hypoxemia, goal at rest is greater than 85% SaO2, with movement ideally above 75% Decision for intubation should be based on a change in mental status or physical evidence of ventilatory failure such  as nasal flaring, accessory muscle use, paradoxical breathing Out of bed to chair as able Incentive spirometry is important, use every hour Prone positioning while in bed 8/17 continue steroids, lasix again today, lasix again today, baricitinib, remdesivir  Dementia Frequent orientation namenda  Acute systolic heart failure: unclear if this is an initial diagnosis or not Tele Lasix Add low dose metoprolol   Best practice:  Diet: advance diet Pain/Anxiety/Delirium protocol (if indicated): n/a VAP protocol (if indicated): n/a DVT prophylaxis: lovenox GI prophylaxis: n/a Glucose control: monitor Mobility: out of bed Code Status: full Family Communication: I updated 9/17 by phone on 8/17; daughter says that she has expressed in the past that she would want to be full code.  I explained that I thought that she would do poorly with intubation and mechanical ventilation and she and the family need to continue to talk about her overall prognosis. Disposition: move to progresive care  Labs   CBC: Recent Labs  Lab 03/30/20 1204 03/30/20 2104 03/31/20 0337  WBC 7.4  --  5.6  NEUTROABS 6.3  --  4.7  HGB 12.4 11.2* 11.8*  HCT 39.2 33.0* 36.3  MCV 92.7  --  92.4  PLT 199  --  204    Basic Metabolic Panel: Recent Labs  Lab 03/30/20 1204 03/30/20 2104 03/31/20 0337  NA 135 139 140  K 3.9 3.9 4.0  CL 96*  --  104  CO2 25  --  25  GLUCOSE 137*  --  131*  BUN 8  --  10  CREATININE 0.71  --  0.61  CALCIUM 8.3*  --  7.9*  MG  --   --  2.3  PHOS  --   --  3.1   GFR: Estimated Creatinine Clearance: 59.3 mL/min (by C-G formula based on SCr of 0.61 mg/dL). Recent Labs  Lab 03/30/20 1201 03/30/20 1204 03/30/20 1408 03/31/20 0337  PROCALCITON  --  0.11  --   --   WBC  --  7.4  --  5.6  LATICACIDVEN 2.2*  --  1.1  --     Liver Function Tests: Recent Labs  Lab 03/30/20 1204 03/31/20 0337  AST 38 33  ALT 21 22  ALKPHOS 48 52  BILITOT 0.7 0.7  PROT 7.1 6.6    ALBUMIN 2.6* 2.1*   No results for input(s): LIPASE, AMYLASE in the last 168 hours. No results for input(s): AMMONIA in the last 168 hours.  ABG    Component Value Date/Time   PHART 7.468 (H) 03/30/2020 2104   PCO2ART 39.2 03/30/2020 2104   PO2ART 49 (L) 03/30/2020 2104   HCO3 28.4 (H) 03/30/2020 2104   TCO2 30 03/30/2020 2104   O2SAT 87.0 03/30/2020 2104     Coagulation Profile: No results for input(s): INR, PROTIME in the last 168 hours.  Cardiac Enzymes: No results for input(s): CKTOTAL, CKMB, CKMBINDEX, TROPONINI in the last 168 hours.  HbA1C: Hemoglobin A1C  Date/Time Value Ref Range Status  04/15/2017 04:16 PM 5.3  Final    CBG: Recent Labs  Lab 03/31/20 0003  GLUCAP 119*     Critical care time: 35 minutes    Heber , MD [place] PCCM Pager: [phone] Cell: [phone] If no response, call [phone]

## 2020-04-01 NOTE — Progress Notes (Signed)
[facility] PCCM  Will consult palliative medicine to continue goals of care conversation with the patient's daughter.  I do not believe this lady would survive to hospital discharge should she require mechanical ventilation.  In the past apparently the patient stated she wanted "everything done".  Would ask for palliative care to continue engaging family to ask if they are willing to consider full medical support but DNR status.\  Heber [place], MD [place] PCCM Pager: [phone] Cell: [phone] If no response, call [phone]

## 2020-04-01 NOTE — Progress Notes (Signed)
Patient trasfered from 6M to [phone] via bed; alert and oriented x 1; no complaints of pain; IV saline locked in LFA and RFA; patient is receiving oxygen via HHFNC 40 L; FiO2 100%; sat O2 94%. skin intact. Orient patient to room and unit; instructed how to use the call bell and  fall risk precautions. Will continue to monitor the patient.

## 2020-04-02 ENCOUNTER — Inpatient Hospital Stay (HOSPITAL_COMMUNITY): Payer: Medicare Other

## 2020-04-02 DIAGNOSIS — U071 COVID-19: Secondary | ICD-10-CM | POA: Diagnosis not present

## 2020-04-02 DIAGNOSIS — R7989 Other specified abnormal findings of blood chemistry: Secondary | ICD-10-CM | POA: Diagnosis not present

## 2020-04-02 LAB — CBC WITH DIFFERENTIAL/PLATELET
Abs Immature Granulocytes: 0.13 10*3/uL — ABNORMAL HIGH (ref 0.00–0.07)
Basophils Absolute: 0 10*3/uL (ref 0.0–0.1)
Basophils Relative: 0 %
Eosinophils Absolute: 0 10*3/uL (ref 0.0–0.5)
Eosinophils Relative: 0 %
HCT: 41.3 % (ref 36.0–46.0)
Hemoglobin: 13.3 g/dL (ref 12.0–15.0)
Immature Granulocytes: 1 %
Lymphocytes Relative: 14 %
Lymphs Abs: 1.5 10*3/uL (ref 0.7–4.0)
MCH: 29.4 pg (ref 26.0–34.0)
MCHC: 32.2 g/dL (ref 30.0–36.0)
MCV: 91.2 fL (ref 80.0–100.0)
Monocytes Absolute: 0.5 10*3/uL (ref 0.1–1.0)
Monocytes Relative: 4 %
Neutro Abs: 9.2 10*3/uL — ABNORMAL HIGH (ref 1.7–7.7)
Neutrophils Relative %: 81 %
Platelets: 205 10*3/uL (ref 150–400)
RBC: 4.53 MIL/uL (ref 3.87–5.11)
RDW: 13.8 % (ref 11.5–15.5)
WBC: 11.3 10*3/uL — ABNORMAL HIGH (ref 4.0–10.5)
nRBC: 0.3 % — ABNORMAL HIGH (ref 0.0–0.2)

## 2020-04-02 LAB — COMPREHENSIVE METABOLIC PANEL
ALT: 22 U/L (ref 0–44)
AST: 25 U/L (ref 15–41)
Albumin: 2.5 g/dL — ABNORMAL LOW (ref 3.5–5.0)
Alkaline Phosphatase: 58 U/L (ref 38–126)
Anion gap: 13 (ref 5–15)
BUN: 15 mg/dL (ref 8–23)
CO2: 29 mmol/L (ref 22–32)
Calcium: 8.1 mg/dL — ABNORMAL LOW (ref 8.9–10.3)
Chloride: 100 mmol/L (ref 98–111)
Creatinine, Ser: 0.63 mg/dL (ref 0.44–1.00)
GFR calc Af Amer: >60 mL/min (ref >60)
GFR calc non Af Amer: >60 mL/min (ref >60)
Glucose, Bld: 102 mg/dL — ABNORMAL HIGH (ref 70–99)
Potassium: 3.1 mmol/L — ABNORMAL LOW (ref 3.5–5.1)
Sodium: 142 mmol/L (ref 135–145)
Total Bilirubin: 0.9 mg/dL (ref 0.3–1.2)
Total Protein: 7.4 g/dL (ref 6.5–8.1)

## 2020-04-02 LAB — MAGNESIUM: Magnesium: 2 mg/dL (ref 1.7–2.4)

## 2020-04-02 LAB — C-REACTIVE PROTEIN: CRP: 10.4 mg/dL — ABNORMAL HIGH (ref <1.0)

## 2020-04-02 LAB — PHOSPHORUS: Phosphorus: 2.6 mg/dL (ref 2.5–4.6)

## 2020-04-02 LAB — FERRITIN: Ferritin: 357 ng/mL — ABNORMAL HIGH (ref 11–307)

## 2020-04-02 LAB — BRAIN NATRIURETIC PEPTIDE: B Natriuretic Peptide: 85.4 pg/mL (ref 0.0–100.0)

## 2020-04-02 LAB — D-DIMER, QUANTITATIVE: D-Dimer, Quant: >20 ug/mL-FEU — ABNORMAL HIGH (ref 0.00–0.50)

## 2020-04-02 MED ORDER — POTASSIUM CHLORIDE CRYS ER 20 MEQ PO TBCR: 40.0000 meq | EXTENDED_RELEASE_TABLET | Freq: Once | ORAL | Status: DC

## 2020-04-02 MED ORDER — PANTOPRAZOLE SODIUM 40 MG PO TBEC
40.0000 mg | DELAYED_RELEASE_TABLET | Freq: Every day | ORAL | Status: DC
  Administered 2020-04-02 – 2020-04-19 (×17): 40 mg via ORAL
  Filled 2020-04-02 (×17): qty 1

## 2020-04-02 MED ORDER — ENOXAPARIN SODIUM 60 MG/0.6ML ~~LOC~~ SOLN
60.0000 mg | Freq: Two times a day (BID) | SUBCUTANEOUS | Status: DC
  Administered 2020-04-02 – 2020-04-10 (×17): 60 mg via SUBCUTANEOUS
  Filled 2020-04-02 (×17): qty 0.6

## 2020-04-02 MED ORDER — POTASSIUM CHLORIDE CRYS ER 20 MEQ PO TBCR
40.0000 meq | EXTENDED_RELEASE_TABLET | Freq: Three times a day (TID) | ORAL | Status: AC
  Administered 2020-04-02 (×3): 40 meq via ORAL
  Filled 2020-04-02 (×3): qty 2

## 2020-04-02 MED ORDER — FUROSEMIDE 10 MG/ML IJ SOLN
80.0000 mg | Freq: Four times a day (QID) | INTRAMUSCULAR | Status: AC
  Administered 2020-04-02 (×2): 80 mg via INTRAVENOUS
  Filled 2020-04-02 (×2): qty 8

## 2020-04-02 NOTE — Progress Notes (Signed)
PROGRESS NOTE                                                                                                                                                                                                             Patient Demographics:    Christina Russell, is a 69 y.o. female, DOB - 25-May-1951, WPY:[id number]  Outpatient Primary MD for the patient is [name], MD    LOS - 3  Admit date - 03/30/2020    Chief Complaint  Patient presents with  . Covid/ Distress  . COVID positive       Brief Narrative 69 year old African-American female who is not vaccinated for Covid and has underlying history of dementia, dyslipidemia who presented to the hospital with shortness of breath.  She was diagnosed with acute hypoxic respiratory failure due to COVID-19 pneumonia and admitted to ICU on heated high flow.  She also developed some CHF.  She was stabilized and subsequently transferred to my service on 04/02/2020.   Subjective:    Christina Russell today has, No headache, No chest pain, No abdominal pain - No Nausea, No new weakness tingling or numbness, mild shortness of breath.   Assessment  & Plan :     1. Acute Hypoxic Resp. Failure due to Acute Covid 19 Viral Pneumonitis during the ongoing 2020 Covid 19 Pandemic - she unfortunately is unvaccinated and has incurred severe parenchymal injury, has been placed on steroids, Remdesivir and Baricitinib, on HHFL 40 Lit, monitor closely.  Encouraged the patient to sit up in chair in the daytime use I-S and flutter valve for pulmonary toiletry and then prone in bed when at night.  Will advance activity and titrate down oxygen as possible.    SpO2: 95 % O2 Flow Rate (L/min): 40 L/min FiO2 (%): 100 %    Recent Labs  Lab 03/30/20 1201 03/30/20 1202 03/30/20 1204 03/30/20 1408 03/31/20 0337 04/01/20 0829 04/02/20 0326  WBC  --   --  7.4  --  5.6 8.0 11.3*  CRP  --   --  21.9*  --   21.8* 15.1* 10.4*  DDIMER  --   --  2.43*  --  3.48* >20.00* >20.00*  BNP  --   --  160.2*  --   --   --  85.4  PROCALCITON  --   --  0.11  --   --   --   --   LATICACIDVEN 2.2*  --   --  1.1  --   --   --   AST  --   --  38  --  33 26 25  ALT  --   --  21  --  ALKPHOS  --   --  48  --  52 55 58  BILITOT  --   --  0.7  --  0.7 0.7 0.9  ALBUMIN  --   --  2.6*  --  2.1* 2.1* 2.5*  SARSCOV2NAA  --  POSITIVE*  --   --   --   --   --        2.  Extremely high D dimer - high risk for a clot, full dose Lovenox, check Leg [place], stable R Atrial pressure on TTE.  3..  At high risk for delirium, minimize narcotics and benzodiazepines, daughter has been counseled.  4.  Dyslipidemia.  Continue home dose statin.  5.  Acute on chronic diastolic heart failure with EF 50 to 55%.  Lasix to be continued, continue low-dose beta-blocker.  Outpatient cardiology follow-up for some distal septal hypokinesis, continue combination of aspirin, statin and beta-blocker for secondary prevention for now.       Condition - Extremely Guarded  Family Communication  :  Daughter Marcelino Duster [phone] on 04/02/20  Code Status :  Full  Consults  :  PCCM  Procedures  :    TTE - 1. Abnormal septal motion with distal hypokinesis . Left ventricular ejection fraction, by estimation, is 50 to 55%. The left ventricle has low normal function. Left ventricular endocardial border not optimally defined to evaluate regional wall motion. Left ventricular diastolic parameters are indeterminate.  2. The mitral valve is normal in structure. Mild mitral valve regurgitation.  3. The aortic valve is tricuspid. Aortic valve regurgitation is not visualized. Mild aortic valve sclerosis is present, with no evidence of aortic valve stenosis.  4. There is normal pulmonary artery systolic pressure.  5. The inferior vena cava is normal in size with greater than 50% respiratory variability, suggesting right atrial pressure of 3 mmHg.    PUD Prophylaxis :  PPI  Disposition Plan  :    Status is: Inpatient  Remains inpatient appropriate because:IV treatments appropriate due to intensity of illness or inability to take PO   Dispo: The patient is from: Home              Anticipated d/c is to: Home              Anticipated d/c date is: > 3 days              Patient currently is not medically stable to d/c.  DVT Prophylaxis  :  Lovenox   Lab Results  Component Value Date   PLT 205 04/02/2020    Diet :  Diet Order            Diet regular Room service appropriate? No; Fluid consistency: Thin  Diet effective now                  Inpatient Medications  Scheduled Meds: . albuterol  2 puff Inhalation TID  . vitamin C  500 mg Oral Daily  . atorvastatin  40 mg Oral Daily  . baricitinib  2 mg Oral Daily  . Chlorhexidine Gluconate Cloth  6 each  Topical Daily  . donepezil  10 mg Oral QHS  . enoxaparin (LOVENOX) injection  60 mg Subcutaneous Q12H  . furosemide  80 mg Intravenous Q6H  . mouth rinse  15 mL Mouth Rinse BID  . methylPREDNISolone (SOLU-MEDROL) injection  40 mg Intravenous Q12H  . metoprolol tartrate  12.5 mg Oral BID  . potassium chloride  40 mEq Oral TID  . zinc sulfate  220 mg Oral Daily   Continuous Infusions: . remdesivir 100 mg in NS 100 mL Stopped (04/01/20 1116)   PRN Meds:.acetaminophen, chlorpheniramine-HYDROcodone, docusate sodium, [DISCONTINUED] ondansetron **OR** ondansetron (ZOFRAN) IV, polyethylene glycol  Antibiotics  :    Anti-infectives (From admission, onward)   Start     Dose/Rate Route Frequency Ordered Stop   03/31/20 1000  remdesivir 100 mg in sodium chloride 0.9 % 100 mL IVPB     Discontinue    "Followed by" Linked Group Details   100 mg 200 mL/hr over 30 Minutes Intravenous Daily 03/30/20 1445 04/04/20 0959   03/30/20 1445  remdesivir 200 mg in sodium chloride 0.9% 250 mL IVPB       "Followed by" Linked Group Details   200 mg 580 mL/hr over 30 Minutes Intravenous  Once 03/30/20 1445 03/30/20 1714       Time Spent in minutes  30   Susa Raring M.D on 04/02/2020 at 10:05 AM  To page go to [email or web address] - password [email or web address]  [facility] -  Office  [phone]    See all Orders from today for further details    Objective:   Vitals:   04/02/20 0500 04/02/20 0613 04/02/20 0723 04/02/20 0730  BP:   130/68   Pulse:  82 78   Resp:  18 (!) 25   Temp:   98.3 F (36.8 C)   TempSrc:   Oral   SpO2:  95% 99% 95%  Weight: 63.8 kg     Height:        Wt Readings from Last 3 Encounters:  04/02/20 63.8 kg  01/30/20 65.3 kg  12/05/19 66 kg     Intake/Output Summary (Last 24 hours) at 04/02/2020 1005 Last data filed at 04/02/2020 0842 Gross per 24 hour  Intake 1489.11 ml  Output 2600 ml  Net -1110.89 ml     Physical Exam  Awake mildly confused, No new F.N deficits, Normal affect Gordon.AT,PERRAL Supple Neck,No JVD, No cervical lymphadenopathy appriciated.  Symmetrical Chest wall movement, Good air movement bilaterally, ++ rales RRR,No Gallops,Rubs or new Murmurs, No Parasternal Heave +ve B.Sounds, Abd Soft, No tenderness, No organomegaly appriciated, No rebound - guarding or rigidity. No Cyanosis, Clubbing or edema, No new Rash or bruise       Data Review:    CBC Recent Labs  Lab 03/30/20 1204 03/30/20 2104 03/31/20 0337 04/01/20 0829 04/02/20 0326  WBC 7.4  --  5.6 8.0 11.3*  HGB 12.4 11.2* 11.8* 12.1 13.3  HCT 39.2 33.0* 36.3 37.3 41.3  PLT 199  --  204 185 205  MCV 92.7  --  92.4 91.2 91.2  MCH 29.3  --  30.0 29.6 29.4  MCHC 31.6  --  32.5 32.4 32.2  RDW 14.0  --  14.0 13.8 13.8  LYMPHSABS 0.6*  --  0.6* 0.6* 1.5  MONOABS 0.4  --  0.3 0.2 0.5  EOSABS 0.0  --  0.0 0.0 0.0  BASOSABS 0.0  --  0.0 0.0 0.0    Chemistries  Recent Labs  Lab 03/30/20 1204 03/30/20 2104  03/31/20 0337 04/01/20 0829 04/02/20 0326  NA 135 139 140 143 142  K 3.9 3.9 4.0 3.7 3.1*  CL 96*  --  104 104 100  CO2 25  --  25 29 29    GLUCOSE 137*  --  131* 149* 102*  BUN 8  --  10 15 15   CREATININE 0.71  --  0.61 0.62 0.63  CALCIUM 8.3*  --  7.9* 7.8* 8.1*  AST 38  --  33 26 25  ALT 21  --  22 22 22   ALKPHOS 48  --  52 55 58  BILITOT 0.7  --  0.7 0.7 0.9  MG  --   --  2.3 2.5* 2.0     ------------------------------------------------------------------------------------------------------------------ Recent Labs    03/30/20 1204  TRIG 75    Lab Results  Component Value Date   HGBA1C 5.3 04/15/2017   ------------------------------------------------------------------------------------------------------------------ No results for input(s): TSH, T4TOTAL, T3FREE, THYROIDAB in the last 72 hours.  Invalid input(s): FREET3  Cardiac Enzymes No results for input(s): CKMB, TROPONINI, MYOGLOBIN in the last 168 hours.  Invalid input(s): CK ------------------------------------------------------------------------------------------------------------------    Component Value Date/Time   BNP 85.4 04/02/2020 0326    Micro Results Recent Results (from the past 240 hour(s))  Blood Culture (routine x 2)     Status: None (Preliminary result)   Collection Time: 03/30/20 11:56 AM   Specimen: BLOOD RIGHT FOREARM  Result Value Ref Range Status   Specimen Description BLOOD RIGHT FOREARM  Final   Special Requests   Final    BOTTLES DRAWN AEROBIC AND ANAEROBIC Blood Culture results may not be optimal due to an excessive volume of blood received in culture bottles   Culture   Final    NO GROWTH 3 DAYS Performed at [facility] Lab, [address] [place], [facility] [address]    Report Status PENDING  Incomplete  SARS Coronavirus 2 by RT PCR (hospital order, performed in [facility] hospital lab) Nasopharyngeal Nasopharyngeal Swab     Status: Abnormal   Collection Time: 03/30/20 12:02 PM   Specimen: Nasopharyngeal Swab  Result Value Ref Range Status   SARS Coronavirus 2 POSITIVE (A) NEGATIVE Final    Comment: RESULT  CALLED TO, READ BACK BY AND VERIFIED WITH: RN E DIXON [id number] MLM (NOTE) SARS-CoV-2 target nucleic acids are DETECTED  SARS-CoV-2 RNA is generally detectable in upper respiratory specimens  during the acute phase of infection.  Positive results are indicative  of the presence of the identified virus, but do not rule out bacterial infection or co-infection with other pathogens not detected by the test.  Clinical correlation with patient history and  other diagnostic information is necessary to determine patient infection status.  The expected result is negative.  Fact Sheet for Patients:   [facility]   Fact Sheet for Healthcare Providers:   04/01/20    This test is not yet approved or cleared by the [id number] FDA and  has been authorized for detection and/or diagnosis of SARS-CoV-2 by FDA under an Emergency Use Authorization (EUA).  This EUA will remain in effect (meaning this test can b e used) for the duration of  the COVID-19 declaration under Section 564(b)(1) of the Act, 21 U.S.C. section 360-bbb-3(b)(1), unless the authorization is terminated or revoked sooner.  Performed at [facility], [address], [place], [facility] [address]   Blood Culture (routine x 2)     Status: None (Preliminary result)   Collection Time: 03/30/20  12:27 PM   Specimen: BLOOD  Result Value Ref Range Status   Specimen Description BLOOD LEFT ANTECUBITAL  Final   Special Requests   Final    BOTTLES DRAWN AEROBIC ONLY Blood Culture results may not be optimal due to an inadequate volume of blood received in culture bottles   Culture   Final    NO GROWTH 3 DAYS Performed at [facility], [address] [place], [place] [address]    Report Status PENDING  Incomplete  MRSA PCR Screening     Status: None   Collection Time: 03/31/20 12:01 AM   Specimen: Nasopharyngeal  Result Value Ref Range Status   MRSA by PCR  NEGATIVE NEGATIVE Final    Comment:        The GeneXpert MRSA Assay (FDA approved for NASAL specimens only), is one component of a comprehensive MRSA colonization surveillance program. It is not intended to diagnose MRSA infection nor to guide or monitor treatment for MRSA infections. Performed at [facility], [address] [place], [place] [address]     Radiology Reports DG Chest [place] 1 View  Result Date: 04/02/2020 CLINICAL DATA:  Acute respiratory failure with hypoxemia, COVID-19 EXAM: PORTABLE CHEST 1 VIEW COMPARISON:  Portable exam 0652 hours compared 03/30/2020 FINDINGS: Normal heart size, mediastinal contours, and pulmonary vascularity. Atherosclerotic calcification aorta. Scattered pulmonary infiltrates bilaterally consistent with multifocal pneumonia most confluent in the LEFT lower lobe. No pleural effusion or pneumothorax. Mild osseous demineralization. IMPRESSION: Persistent pulmonary infiltrates consistent with multifocal pneumonia and history of COVID-19, slightly improved. Electronically Signed   By: Ulyses Southward M.D.   On: 04/02/2020 08:34   DG Chest Port 1 View  Result Date: 03/30/2020 CLINICAL DATA:  Patient with history of COVID-19. Worsening shortness of breath. EXAM: PORTABLE CHEST 1 VIEW COMPARISON:  None. FINDINGS: Monitoring leads overlie the patient. Enlarged cardiac and mediastinal contours. Diffuse bilateral airspace opacities. Probable small left pleural effusion. No pneumothorax. IMPRESSION: 1. Cardiomegaly. 2. Diffuse bilateral airspace opacities most compatible with multifocal pneumonia. Electronically Signed   By: Annia Belt M.D.   On: 03/30/2020 12:51   ECHOCARDIOGRAM LIMITED  Result Date: 03/31/2020    ECHOCARDIOGRAM LIMITED REPORT   Patient Name:   MARCELYN RUPPE Date of Exam: 03/31/2020 Medical Rec #:  [id number]     Height:       62.0 in Accession #:    [id number]    Weight:       142.2 lb Date of Birth:  06-14-1951     BSA:          1.654 m  Patient Age:    68 years      BP:           136/80 mmHg Patient Gender: F             HR:           86 bpm. Exam Location:  Inpatient Procedure: Limited Echo, Color Doppler and Cardiac Doppler Indications:    R06.9 DOE  History:        Patient has no prior history of Echocardiogram examinations.                 Risk Factors:Dyslipidemia.  Sonographer:    Irving Burton Senior RDCS Referring Phys: Humberto Seals  Sonographer Comments: COVID+ at time of study IMPRESSIONS  1. Abnormal septal motion with distal hypokinesis . Left ventricular ejection fraction, by estimation, is 50 to 55%. The left ventricle has  low normal function. Left ventricular endocardial border not optimally defined to evaluate regional wall motion. Left ventricular diastolic parameters are indeterminate.  2. The mitral valve is normal in structure. Mild mitral valve regurgitation.  3. The aortic valve is tricuspid. Aortic valve regurgitation is not visualized. Mild aortic valve sclerosis is present, with no evidence of aortic valve stenosis.  4. There is normal pulmonary artery systolic pressure.  5. The inferior vena cava is normal in size with greater than 50% respiratory variability, suggesting right atrial pressure of 3 mmHg. FINDINGS  Left Ventricle: Abnormal septal motion with distal hypokinesis. Left ventricular ejection fraction, by estimation, is 50 to 55%. The left ventricle has low normal function. Left ventricular endocardial border not optimally defined to evaluate regional wall motion. The left ventricular internal cavity size was normal in size. Right Ventricle: There is normal pulmonary artery systolic pressure. The tricuspid regurgitant velocity is 2.33 m/s, and with an assumed right atrial pressure of 3 mmHg, the estimated right ventricular systolic pressure is 24.7 mmHg. Mitral Valve: The mitral valve is normal in structure. There is mild thickening of the mitral valve leaflet(s). There is mild calcification of the mitral valve  leaflet(s). Mild mitral annular calcification. Mild mitral valve regurgitation. Tricuspid Valve: The tricuspid valve is normal in structure. Tricuspid valve regurgitation is mild. Aortic Valve: The aortic valve is tricuspid. Aortic valve regurgitation is not visualized. Mild aortic valve sclerosis is present, with no evidence of aortic valve stenosis. Venous: The inferior vena cava is normal in size with greater than 50% respiratory variability, suggesting right atrial pressure of 3 mmHg. IAS/Shunts: The interatrial septum was not well visualized. RIGHT VENTRICLE RV S prime:     10.80 cm/s TAPSE (M-mode): 1.7 cm AORTIC VALVE LVOT Vmax:   95.30 cm/s LVOT Vmean:  65.900 cm/s LVOT VTI:    0.180 m MITRAL VALVE               TRICUSPID VALVE MV Area (PHT): 4.21 cm    TR Peak grad:   21.7 mmHg MV Decel Time: 180 msec    TR Vmax:        233.00 cm/s MV E velocity: 57.70 cm/s MV A velocity: 73.20 cm/s  SHUNTS MV E/A ratio:  0.79        Systemic VTI: 0.18 m Charlton Haws MD Electronically signed by Charlton Haws MD Signature Date/Time: 03/31/2020/3:13:28 PM    Final

## 2020-04-02 NOTE — Progress Notes (Signed)
Bladder scan performed on patient.  Patient had 123 ml's of urine in bladder.  Patient has no complaints of discomfort or pain at this time.  Will continue to monitor.

## 2020-04-02 NOTE — Progress Notes (Signed)
Venous duplex lower ext.  has been completed. Refer to [facility] under chart review to view preliminary results.   04/02/2020  2:22 PM Zanylah Hardie, Gerarda Gunther

## 2020-04-02 NOTE — Progress Notes (Signed)
K+ 3.1 this AM. Ok to give Kdur x1 per Dr. Thedore Mins.  Ulyses Southward, PharmD, BCIDP, AAHIVP, CPP Infectious Disease Pharmacist 04/02/2020 9:17 AM

## 2020-04-03 ENCOUNTER — Inpatient Hospital Stay (HOSPITAL_COMMUNITY): Payer: Medicare Other

## 2020-04-03 LAB — COMPREHENSIVE METABOLIC PANEL
ALT: 23 U/L (ref 0–44)
AST: 27 U/L (ref 15–41)
Albumin: 2.7 g/dL — ABNORMAL LOW (ref 3.5–5.0)
Alkaline Phosphatase: 64 U/L (ref 38–126)
Anion gap: 16 — ABNORMAL HIGH (ref 5–15)
BUN: 18 mg/dL (ref 8–23)
CO2: 28 mmol/L (ref 22–32)
Calcium: 8.6 mg/dL — ABNORMAL LOW (ref 8.9–10.3)
Chloride: 96 mmol/L — ABNORMAL LOW (ref 98–111)
Creatinine, Ser: 0.71 mg/dL (ref 0.44–1.00)
GFR calc Af Amer: >60 mL/min (ref >60)
GFR calc non Af Amer: >60 mL/min (ref >60)
Glucose, Bld: 133 mg/dL — ABNORMAL HIGH (ref 70–99)
Potassium: 4.2 mmol/L (ref 3.5–5.1)
Sodium: 140 mmol/L (ref 135–145)
Total Bilirubin: 1.2 mg/dL (ref 0.3–1.2)
Total Protein: 8.1 g/dL (ref 6.5–8.1)

## 2020-04-03 LAB — CBC WITH DIFFERENTIAL/PLATELET
Abs Immature Granulocytes: 0.17 10*3/uL — ABNORMAL HIGH (ref 0.00–0.07)
Basophils Absolute: 0 10*3/uL (ref 0.0–0.1)
Basophils Relative: 0 %
Eosinophils Absolute: 0 10*3/uL (ref 0.0–0.5)
Eosinophils Relative: 0 %
HCT: 46.2 % — ABNORMAL HIGH (ref 36.0–46.0)
Hemoglobin: 14.8 g/dL (ref 12.0–15.0)
Immature Granulocytes: 1 %
Lymphocytes Relative: 9 %
Lymphs Abs: 1.1 10*3/uL (ref 0.7–4.0)
MCH: 29.1 pg (ref 26.0–34.0)
MCHC: 32 g/dL (ref 30.0–36.0)
MCV: 90.9 fL (ref 80.0–100.0)
Monocytes Absolute: 0.4 10*3/uL (ref 0.1–1.0)
Monocytes Relative: 3 %
Neutro Abs: 11.3 10*3/uL — ABNORMAL HIGH (ref 1.7–7.7)
Neutrophils Relative %: 87 %
Platelets: 233 10*3/uL (ref 150–400)
RBC: 5.08 MIL/uL (ref 3.87–5.11)
RDW: 14 % (ref 11.5–15.5)
WBC: 13 10*3/uL — ABNORMAL HIGH (ref 4.0–10.5)
nRBC: 0.2 % (ref 0.0–0.2)

## 2020-04-03 LAB — BRAIN NATRIURETIC PEPTIDE: B Natriuretic Peptide: 43.3 pg/mL (ref 0.0–100.0)

## 2020-04-03 LAB — C-REACTIVE PROTEIN: CRP: 22.5 mg/dL — ABNORMAL HIGH (ref <1.0)

## 2020-04-03 LAB — PROCALCITONIN: Procalcitonin: 0.21 ng/mL

## 2020-04-03 LAB — D-DIMER, QUANTITATIVE: D-Dimer, Quant: >20 ug/mL-FEU — ABNORMAL HIGH (ref 0.00–0.50)

## 2020-04-03 LAB — MAGNESIUM: Magnesium: 2.2 mg/dL (ref 1.7–2.4)

## 2020-04-03 MED ORDER — METHYLPREDNISOLONE SODIUM SUCC 125 MG IJ SOLR
60.0000 mg | Freq: Two times a day (BID) | INTRAMUSCULAR | Status: DC
  Administered 2020-04-03 – 2020-04-10 (×15): 60 mg via INTRAVENOUS
  Filled 2020-04-03 (×15): qty 2

## 2020-04-03 MED ORDER — POTASSIUM CHLORIDE 20 MEQ PO PACK
20.0000 meq | PACK | Freq: Once | ORAL | Status: AC
  Administered 2020-04-03: 20 meq via ORAL
  Filled 2020-04-03: qty 1

## 2020-04-03 MED ORDER — POTASSIUM CHLORIDE CRYS ER 20 MEQ PO TBCR: 20.0000 meq | EXTENDED_RELEASE_TABLET | Freq: Once | ORAL | Status: DC

## 2020-04-03 MED ORDER — NYSTATIN 100000 UNIT/ML MT SUSP
5.0000 mL | Freq: Three times a day (TID) | OROMUCOSAL | Status: AC
  Administered 2020-04-03 – 2020-04-05 (×11): 500000 [IU] via OROMUCOSAL
  Filled 2020-04-03 (×10): qty 5

## 2020-04-03 MED ORDER — RESOURCE THICKENUP CLEAR PO POWD
ORAL | Status: DC | PRN
  Filled 2020-04-03 (×2): qty 125

## 2020-04-03 MED ORDER — SODIUM CHLORIDE 0.9 % IV SOLN
3.0000 g | Freq: Four times a day (QID) | INTRAVENOUS | Status: AC
  Administered 2020-04-03 – 2020-04-09 (×26): 3 g via INTRAVENOUS
  Filled 2020-04-03 (×2): qty 3
  Filled 2020-04-03: qty 8
  Filled 2020-04-03: qty 3
  Filled 2020-04-03 (×4): qty 8
  Filled 2020-04-03 (×2): qty 3
  Filled 2020-04-03 (×2): qty 8
  Filled 2020-04-03: qty 3
  Filled 2020-04-03 (×2): qty 8
  Filled 2020-04-03: qty 3
  Filled 2020-04-03 (×2): qty 8
  Filled 2020-04-03 (×3): qty 3
  Filled 2020-04-03 (×2): qty 8
  Filled 2020-04-03: qty 3
  Filled 2020-04-03 (×3): qty 8
  Filled 2020-04-03: qty 3

## 2020-04-03 MED ORDER — FUROSEMIDE 10 MG/ML IJ SOLN
60.0000 mg | Freq: Once | INTRAMUSCULAR | Status: AC
  Administered 2020-04-03: 60 mg via INTRAVENOUS
  Filled 2020-04-03: qty 6

## 2020-04-03 NOTE — Progress Notes (Signed)
PROGRESS NOTE                                                                                                                                                                                                             Patient Demographics:    Christina Russell, is a 69 y.o. female, DOB - 11-06-1950, WNI:[id number]  Outpatient Primary MD for the patient is [name], MD    LOS - 4  Admit date - 03/30/2020    Chief Complaint  Patient presents with  . Covid/ Distress  . COVID positive       Brief Narrative 69 year old African-American female who is not vaccinated for Covid and has underlying history of dementia, dyslipidemia who presented to the hospital with shortness of breath.  She was diagnosed with acute hypoxic respiratory failure due to COVID-19 pneumonia and admitted to ICU on heated high flow.  She also developed some CHF.  She was stabilized and subsequently transferred to my service on 04/02/2020.   Subjective:   Patient in bed, appears comfortable, denies any headache, no fever, no chest pain or pressure, +ve shortness of breath , no abdominal pain. No focal weakness.    Assessment  & Plan :     1. Acute Hypoxic Resp. Failure due to Acute Covid 19 Viral Pneumonitis during the ongoing 2020 Covid 19 Pandemic - she unfortunately is unvaccinated and has incurred severe parenchymal injury, has been placed on steroids, Remdesivir and Baricitinib, on HHFL 35 Lit, monitor closely, does have some leukocytosis which likely could be due to steroids however with ongoing encephalopathy chances of some aspiration as well, placed on Unasyn, MRSA screen, speech eval, soft diet with elevated head of the bed for now.  Encouraged the patient to sit up in chair in the daytime use I-S and flutter valve for pulmonary toiletry and then prone in bed when at night.  Will advance activity and titrate down oxygen as possible.    SpO2: 90  % O2 Flow Rate (L/min): 35 L/min FiO2 (%): 90 %    Recent Labs  Lab 03/30/20 1201 03/30/20 1202 03/30/20 1204 03/30/20 1408 03/31/20 0337 04/01/20 0829 04/02/20 0326 04/03/20 0452 04/03/20 0720  WBC  --   --  7.4  --  5.6 8.0 11.3* 13.0*  --   CRP  --   --  21.9*  --  21.8* 15.1* 10.4* 22.5*  --   DDIMER  --   --  2.43*  --  3.48* >20.00* >20.00* >20.00*  --   BNP  --   --  160.2*  --   --   --  85.4 43.3  --   PROCALCITON  --   --  0.11  --   --   --   --   --  0.21  LATICACIDVEN 2.2*  --   --  1.1  --   --   --   --   --   AST  --   --  38  --  33 26 25 27   --   ALT  --   --  21  --  22 22 22 23   --   ALKPHOS  --   --  48  --  52 55 58 64  --   BILITOT  --   --  0.7  --  0.7 0.7 0.9 1.2  --   ALBUMIN  --   --  2.6*  --  2.1* 2.1* 2.5* 2.7*  --   SARSCOV2NAA  --  POSITIVE*  --   --   --   --   --   --   --        2.  Extremely high D dimer - high risk for a clot, full dose Lovenox, -ve Leg , stable R Atrial pressure on TTE, too hypoxic for CTA.  3.  Underlying history of dementia.  At high risk for delirium, minimize narcotics and benzodiazepines, daughter has been counseled.  Currently does have mild toxic and metabolic encephalopathy on top of her dementia.  4.  Dyslipidemia.  Continue home dose statin.  5.  Acute on chronic diastolic heart failure with EF 50 to 55%.  Lasix to be continued and dose daily, continue low-dose beta-blocker.  Outpatient cardiology follow-up for some distal septal hypokinesis, continue combination of aspirin, statin and beta-blocker for secondary prevention for now.       Condition - Extremely Guarded  Family Communication  :  Daughter [phone] on 04/02/20, 04/03/20  Code Status : DNR - DW daughter - 04/03/20 - 9.23 am  Consults  :  PCCM  Procedures  :    Leg 04/05/20 - No DVT  TTE - 1. Abnormal septal motion with distal hypokinesis . Left ventricular ejection fraction, by estimation, is 50 to 55%. The left ventricle has low  normal function. Left ventricular endocardial border not optimally defined to evaluate regional wall motion. Left ventricular diastolic parameters are indeterminate.  2. The mitral valve is normal in structure. Mild mitral valve regurgitation.  3. The aortic valve is tricuspid. Aortic valve regurgitation is not visualized. Mild aortic valve sclerosis is present, with no evidence of aortic valve stenosis.  4. There is normal pulmonary artery systolic pressure.  5. The inferior vena cava is normal in size with greater than 50% respiratory variability, suggesting right atrial pressure of 3 mmHg.   PUD Prophylaxis :  PPI  Disposition Plan  :    Status is: Inpatient  Remains inpatient appropriate because:IV treatments appropriate due to intensity of illness or inability to take PO   Dispo: The patient is from: Home              Anticipated d/c is to: Home              Anticipated d/c date is: > 3 days  Patient currently is not medically stable to d/c.  DVT Prophylaxis  :  Lovenox   Lab Results  Component Value Date   PLT 233 04/03/2020    Diet :  Diet Order            DIET SOFT Room service appropriate? Yes; Fluid consistency: Thin  Diet effective now                  Inpatient Medications  Scheduled Meds: . albuterol  2 puff Inhalation TID  . vitamin C  500 mg Oral Daily  . atorvastatin  40 mg Oral Daily  . baricitinib  2 mg Oral Daily  . Chlorhexidine Gluconate Cloth  6 each Topical Daily  . donepezil  10 mg Oral QHS  . enoxaparin (LOVENOX) injection  60 mg Subcutaneous Q12H  . furosemide  60 mg Intravenous Once  . mouth rinse  15 mL Mouth Rinse BID  . methylPREDNISolone (SOLU-MEDROL) injection  60 mg Intravenous BID  . metoprolol tartrate  12.5 mg Oral BID  . nystatin  5 mL Mouth/Throat TID AC & HS  . pantoprazole  40 mg Oral Daily  . potassium chloride  20 mEq Oral Once  . zinc sulfate  220 mg Oral Daily   Continuous Infusions:  PRN  Meds:.acetaminophen, chlorpheniramine-HYDROcodone, docusate sodium, [DISCONTINUED] ondansetron **OR** ondansetron (ZOFRAN) IV, polyethylene glycol  Antibiotics  :    Anti-infectives (From admission, onward)   Start     Dose/Rate Route Frequency Ordered Stop   03/31/20 1000  remdesivir 100 mg in sodium chloride 0.9 % 100 mL IVPB       "Followed by" Linked Group Details   100 mg 200 mL/hr over 30 Minutes Intravenous Daily 03/30/20 1445 04/03/20 0917   03/30/20 1445  remdesivir 200 mg in sodium chloride 0.9% 250 mL IVPB       "Followed by" Linked Group Details   200 mg 580 mL/hr over 30 Minutes Intravenous Once 03/30/20 1445 03/30/20 1714       Time Spent in minutes  30   Susa Raring M.D on 04/03/2020 at 9:21 AM  To page go to [email or web address] - password [email or web address]  [facility] -  Office  [phone]    See all Orders from today for further details    Objective:   Vitals:   04/02/20 2107 04/03/20 0000 04/03/20 0400 04/03/20 0800  BP: 121/86 112/66 (!) 112/59 118/81  Pulse: (!) 101 89 74 (!) 104  Resp: Temp:  98.6 F (37 C) 98.9 F (37.2 C) 98.1 F (36.7 C)  TempSrc:  Axillary Axillary Oral  SpO2: 91% 91% (!) 87% 90%  Weight:      Height:        Wt Readings from Last 3 Encounters:  04/02/20 63.8 kg  01/30/20 65.3 kg  12/05/19 66 kg     Intake/Output Summary (Last 24 hours) at 04/03/2020 0921 Last data filed at 04/03/2020 0900 Gross per 24 hour  Intake 150 ml  Output --  Net 150 ml     Physical Exam  Awake confused, No new F.N deficits, Normal affect [place].AT,PERRAL Supple Neck,No JVD, No cervical lymphadenopathy appriciated.  Symmetrical Chest wall movement, Good air movement bilaterally, ++ rales RRR,No Gallops, Rubs or new Murmurs, No Parasternal Heave +ve B.Sounds, Abd Soft, No tenderness, No organomegaly appriciated, No rebound - guarding or rigidity. No Cyanosis, Clubbing or edema, No new Rash or bruise    Data Review:  CBC Recent Labs  Lab 03/30/20 1204 03/30/20 1204 03/30/20 2104 03/31/20 0337 04/01/20 0829 04/02/20 0326 04/03/20 0452  WBC 7.4  --   --  5.6 8.0 11.3* 13.0*  HGB 12.4   < > 11.2* 11.8* 12.1 13.3 14.8  HCT 39.2   < > 33.0* 36.3 37.3 41.3 46.2*  PLT 199  --   --  204 185 205 233  MCV 92.7  --   --  92.4 91.2 91.2 90.9  MCH 29.3  --   --  30.0 29.6 29.4 29.1  MCHC 31.6  --   --  32.5 32.4 32.2 32.0  RDW 14.0  --   --  14.0 13.8 13.8 14.0  LYMPHSABS 0.6*  --   --  0.6* 0.6* 1.5 1.1  MONOABS 0.4  --   --  0.3 0.2 0.5 0.4  EOSABS 0.0  --   --  0.0 0.0 0.0 0.0  BASOSABS 0.0  --   --  0.0 0.0 0.0 0.0   < > = values in this interval not displayed.    Chemistries  Recent Labs  Lab 03/30/20 1204 03/30/20 1204 03/30/20 2104 03/31/20 0337 04/01/20 0829 04/02/20 0326 04/03/20 0452  NA 135   < > 139 140 143 142 140  K 3.9   < > 3.9 4.0 3.7 3.1* 4.2  CL 96*  --   --  104 104 100 96*  CO2 25  --   --  25 29 29 28   GLUCOSE 137*  --   --  131* 149* 102* 133*  BUN 8  --   --  10 15 15 18   CREATININE 0.71  --   --  0.61 0.62 0.63 0.71  CALCIUM 8.3*  --   --  7.9* 7.8* 8.1* 8.6*  AST 38  --   --  33 26 25 27   ALT 21  --   --  22 22 22 23   ALKPHOS 48  --   --  52 55 58 64  BILITOT 0.7  --   --  0.7 0.7 0.9 1.2  MG  --   --   --  2.3 2.5* 2.0 2.2   < > = values in this interval not displayed.     ------------------------------------------------------------------------------------------------------------------ No results for input(s): CHOL, HDL, LDLCALC, TRIG, CHOLHDL, LDLDIRECT in the last 72 hours.  Lab Results  Component Value Date   HGBA1C 5.3 04/15/2017   ------------------------------------------------------------------------------------------------------------------ No results for input(s): TSH, T4TOTAL, T3FREE, THYROIDAB in the last 72 hours.  Invalid input(s): FREET3  Cardiac Enzymes No results for input(s): CKMB, TROPONINI, MYOGLOBIN in the last 168  hours.  Invalid input(s): CK ------------------------------------------------------------------------------------------------------------------    Component Value Date/Time   BNP 43.3 04/03/2020 0452    Micro Results Recent Results (from the past 240 hour(s))  Blood Culture (routine x 2)     Status: None (Preliminary result)   Collection Time: 03/30/20 11:56 AM   Specimen: BLOOD RIGHT FOREARM  Result Value Ref Range Status   Specimen Description BLOOD RIGHT FOREARM  Final   Special Requests   Final    BOTTLES DRAWN AEROBIC AND ANAEROBIC Blood Culture results may not be optimal due to an excessive volume of blood received in culture bottles   Culture   Final    NO GROWTH 4 DAYS Performed at [facility], [address], [place], [place] [address]    Report Status PENDING  Incomplete  SARS Coronavirus 2 by RT PCR (  hospital order, performed in [facility] hospital lab) Nasopharyngeal Nasopharyngeal Swab     Status: Abnormal   Collection Time: 03/30/20 12:02 PM   Specimen: Nasopharyngeal Swab  Result Value Ref Range Status   SARS Coronavirus 2 POSITIVE (A) NEGATIVE Final    Comment: RESULT CALLED TO, READ BACK BY AND VERIFIED WITH: RN E DIXON [id number] 1324 MLM (NOTE) SARS-CoV-2 target nucleic acids are DETECTED  SARS-CoV-2 RNA is generally detectable in upper respiratory specimens  during the acute phase of infection.  Positive results are indicative  of the presence of the identified virus, but do not rule out bacterial infection or co-infection with other pathogens not detected by the test.  Clinical correlation with patient history and  other diagnostic information is necessary to determine patient infection status.  The expected result is negative.  Fact Sheet for Patients:   BoilerBrush.com.cy   Fact Sheet for Healthcare Providers:   https://pope.com/    This test is not yet approved or cleared by the [place] FDA  and  has been authorized for detection and/or diagnosis of SARS-CoV-2 by FDA under an Emergency Use Authorization (EUA).  This EUA will remain in effect (meaning this test can b e used) for the duration of  the COVID-19 declaration under Section 564(b)(1) of the Act, 21 U.S.C. section 360-bbb-3(b)(1), unless the authorization is terminated or revoked sooner.  Performed at [facility], [address] [place], [place] [address]   Blood Culture (routine x 2)     Status: None (Preliminary result)   Collection Time: 03/30/20 12:27 PM   Specimen: BLOOD  Result Value Ref Range Status   Specimen Description BLOOD LEFT ANTECUBITAL  Final   Special Requests   Final    BOTTLES DRAWN AEROBIC ONLY Blood Culture results may not be optimal due to an inadequate volume of blood received in culture bottles   Culture   Final    NO GROWTH 4 DAYS Performed at [facility], [address] [place], [place] [address]    Report Status PENDING  Incomplete  MRSA PCR Screening     Status: None   Collection Time: 03/31/20 12:01 AM   Specimen: Nasopharyngeal  Result Value Ref Range Status   MRSA by PCR NEGATIVE NEGATIVE Final    Comment:        The GeneXpert MRSA Assay (FDA approved for NASAL specimens only), is one component of a comprehensive MRSA colonization surveillance program. It is not intended to diagnose MRSA infection nor to guide or monitor treatment for MRSA infections. Performed at [facility], [address], [place], [place] [address]     Radiology Reports DG Chest [place] 1 View  Result Date: 04/03/2020 CLINICAL DATA:  History of COVID infection EXAM: PORTABLE CHEST 1 VIEW COMPARISON:  April 02, 2020 FINDINGS: Images rotated to the LEFT. Accounting for this rotation cardiomediastinal contours are stable. Increasing opacity at the LEFT lung base compared to the prior study. Also with increased opacity at the RIGHT lung base. Air bronchograms are seen at the LEFT  lung base on today's study as well. Exam is more similar to the study of August 15. Probable small LEFT effusion as before. On limited assessment skeletal structures are unremarkable. IMPRESSION: Bilateral opacities worse at the lung bases and worse in the LEFT lung base, similar to prior imaging in this patient with reported history of COVID-19 pneumonia. Electronically Signed   By: Donzetta Kohut M.D.   On: 04/03/2020 07:54  DG Chest Port 1 View  Result Date: 04/02/2020 CLINICAL DATA:  Acute respiratory failure with hypoxemia, COVID-19 EXAM: PORTABLE CHEST 1 VIEW COMPARISON:  Portable exam 0652 hours compared 03/30/2020 FINDINGS: Normal heart size, mediastinal contours, and pulmonary vascularity. Atherosclerotic calcification aorta. Scattered pulmonary infiltrates bilaterally consistent with multifocal pneumonia most confluent in the LEFT lower lobe. No pleural effusion or pneumothorax. Mild osseous demineralization. IMPRESSION: Persistent pulmonary infiltrates consistent with multifocal pneumonia and history of COVID-19, slightly improved. Electronically Signed   By: Ulyses Southward M.D.   On: 04/02/2020 08:34   DG Chest Port 1 View  Result Date: 03/30/2020 CLINICAL DATA:  Patient with history of COVID-19. Worsening shortness of breath. EXAM: PORTABLE CHEST 1 VIEW COMPARISON:  None. FINDINGS: Monitoring leads overlie the patient. Enlarged cardiac and mediastinal contours. Diffuse bilateral airspace opacities. Probable small left pleural effusion. No pneumothorax. IMPRESSION: 1. Cardiomegaly. 2. Diffuse bilateral airspace opacities most compatible with multifocal pneumonia. Electronically Signed   By: Annia Belt M.D.   On: 03/30/2020 12:51   VAS [place] LOWER EXTREMITY VENOUS (DVT)  Result Date: 04/02/2020  Lower Venous DVTStudy Other Indications: Covid positive with elevated d-dimer. Performing Technologist: Marilynne Halsted RDMS, RVT  Examination Guidelines: A complete evaluation includes B-mode imaging,  spectral Doppler, color Doppler, and power Doppler as needed of all accessible portions of each vessel. Bilateral testing is considered an integral part of a complete examination. Limited examinations for reoccurring indications may be performed as noted. The reflux portion of the exam is performed with the patient in reverse Trendelenburg.  +---------+---------------+---------+-----------+----------+--------------+ RIGHT    CompressibilityPhasicitySpontaneityPropertiesThrombus Aging +---------+---------------+---------+-----------+----------+--------------+ CFV      Full           Yes      Yes                                 +---------+---------------+---------+-----------+----------+--------------+ SFJ      Full                                                        +---------+---------------+---------+-----------+----------+--------------+ FV Prox  Full                                                        +---------+---------------+---------+-----------+----------+--------------+ FV Mid   Full                                                        +---------+---------------+---------+-----------+----------+--------------+ FV DistalFull                                                        +---------+---------------+---------+-----------+----------+--------------+ PFV      Full                                                        +---------+---------------+---------+-----------+----------+--------------+  POP      Full           Yes      Yes                                 +---------+---------------+---------+-----------+----------+--------------+ PTV      Full                                                        +---------+---------------+---------+-----------+----------+--------------+ PERO     Full                                                        +---------+---------------+---------+-----------+----------+--------------+    +---------+---------------+---------+-----------+----------+--------------+ LEFT     CompressibilityPhasicitySpontaneityPropertiesThrombus Aging +---------+---------------+---------+-----------+----------+--------------+ CFV      Full           Yes      Yes                                 +---------+---------------+---------+-----------+----------+--------------+ SFJ      Full                                                        +---------+---------------+---------+-----------+----------+--------------+ FV Prox  Full                                                        +---------+---------------+---------+-----------+----------+--------------+ FV Mid   Full                                                        +---------+---------------+---------+-----------+----------+--------------+ FV DistalFull                                                        +---------+---------------+---------+-----------+----------+--------------+ PFV      Full                                                        +---------+---------------+---------+-----------+----------+--------------+ POP      Full           Yes      Yes                                 +---------+---------------+---------+-----------+----------+--------------+  PTV      Full                                                        +---------+---------------+---------+-----------+----------+--------------+ PERO     Full                                                        +---------+---------------+---------+-----------+----------+--------------+     Summary: BILATERAL: - No evidence of deep vein thrombosis seen in the lower extremities, bilaterally. -No evidence of popliteal cyst, bilaterally.   *See table(s) above for measurements and observations. Electronically signed by Gretta Began MD on 04/02/2020 at 5:04:02 PM.    Final    ECHOCARDIOGRAM LIMITED  Result Date: 03/31/2020     ECHOCARDIOGRAM LIMITED REPORT   Patient Name:   LEILANNY FLUITT Date of Exam: 03/31/2020 Medical Rec #:  [id number]     Height:       62.0 in Accession #:    [id number]    Weight:       142.2 lb Date of Birth:  1950-08-25     BSA:          1.654 m Patient Age:    68 years      BP:           136/80 mmHg Patient Gender: F             HR:           86 bpm. Exam Location:  Inpatient Procedure: Limited Echo, Color Doppler and Cardiac Doppler Indications:    R06.9 DOE  History:        Patient has no prior history of Echocardiogram examinations.                 Risk Factors:Dyslipidemia.  Sonographer:    Irving Burton Senior RDCS Referring Phys: Humberto Seals  Sonographer Comments: COVID+ at time of study IMPRESSIONS  1. Abnormal septal motion with distal hypokinesis . Left ventricular ejection fraction, by estimation, is 50 to 55%. The left ventricle has low normal function. Left ventricular endocardial border not optimally defined to evaluate regional wall motion. Left ventricular diastolic parameters are indeterminate.  2. The mitral valve is normal in structure. Mild mitral valve regurgitation.  3. The aortic valve is tricuspid. Aortic valve regurgitation is not visualized. Mild aortic valve sclerosis is present, with no evidence of aortic valve stenosis.  4. There is normal pulmonary artery systolic pressure.  5. The inferior vena cava is normal in size with greater than 50% respiratory variability, suggesting right atrial pressure of 3 mmHg. FINDINGS  Left Ventricle: Abnormal septal motion with distal hypokinesis. Left ventricular ejection fraction, by estimation, is 50 to 55%. The left ventricle has low normal function. Left ventricular endocardial border not optimally defined to evaluate regional wall motion. The left ventricular internal cavity size was normal in size. Right Ventricle: There is normal pulmonary artery systolic pressure. The tricuspid regurgitant velocity is 2.33 m/s, and with an assumed right atrial  pressure of 3 mmHg, the estimated right ventricular systolic pressure is 24.7 mmHg. Mitral Valve: The mitral valve is normal in structure. There is mild  thickening of the mitral valve leaflet(s). There is mild calcification of the mitral valve leaflet(s). Mild mitral annular calcification. Mild mitral valve regurgitation. Tricuspid Valve: The tricuspid valve is normal in structure. Tricuspid valve regurgitation is mild. Aortic Valve: The aortic valve is tricuspid. Aortic valve regurgitation is not visualized. Mild aortic valve sclerosis is present, with no evidence of aortic valve stenosis. Venous: The inferior vena cava is normal in size with greater than 50% respiratory variability, suggesting right atrial pressure of 3 mmHg. IAS/Shunts: The interatrial septum was not well visualized. RIGHT VENTRICLE RV S prime:     10.80 cm/s TAPSE (M-mode): 1.7 cm AORTIC VALVE LVOT Vmax:   95.30 cm/s LVOT Vmean:  65.900 cm/s LVOT VTI:    0.180 m MITRAL VALVE               TRICUSPID VALVE MV Area (PHT): 4.21 cm    TR Peak grad:   21.7 mmHg MV Decel Time: 180 msec    TR Vmax:        233.00 cm/s MV E velocity: 57.70 cm/s MV A velocity: 73.20 cm/s  SHUNTS MV E/A ratio:  0.79        Systemic VTI: 0.18 m Charlton Haws MD Electronically signed by Charlton Haws MD Signature Date/Time: 03/31/2020/3:13:28 PM    Final

## 2020-04-03 NOTE — TOC Initial Note (Signed)
Transition of Care [facility]) - Initial/Assessment Note    Patient Details  Name: Christina Russell MRN: [id number] Date of Birth: 1950/10/14  Transition of Care [facility]) CM/SW Contact:    Lockie Pares, RN Phone Number: 04/03/2020, 3:59 PM  Clinical Narrative:                 Admitted to ICU initially for COVID pneumonia also CHF on IV medication and high flow O2 35L and 90% currently . Speech evaluation done with deficit. Patient at high risk for aspiration. Will follow for needs. Patient code status is DNR.   Expected Discharge Plan: Home w Home Health Services Barriers to Discharge: Continued Medical Work up   Patient Goals and CMS Choice        Expected Discharge Plan and Services Expected Discharge Plan: Home w Home Health Services   Discharge Planning Services: CM Consult                                          Prior Living Arrangements/Services     Patient language and need for interpreter reviewed:: Yes        Need for Family Participation in Patient Care: Yes (Comment) Care giver support system in place?: Yes (comment)   Criminal Activity/Legal Involvement Pertinent to Current Situation/Hospitalization: No - Comment as needed  Activities of Daily Living Home Assistive Devices/Equipment: Other (Comment) ADL Screening (condition at time of admission) Patient's cognitive ability adequate to safely complete daily activities?: No Is the patient deaf or have difficulty hearing?: No Does the patient have difficulty seeing, even when wearing glasses/contacts?: No Does the patient have difficulty concentrating, remembering, or making decisions?: Yes Patient able to express need for assistance with ADLs?: Yes Does the patient have difficulty dressing or bathing?: No Independently performs ADLs?: No Does the patient have difficulty walking or climbing stairs?: Yes Weakness of Legs: Both Weakness of Arms/Hands: None  Permission Sought/Granted      Share  Information with NAME: Clinton Sawyer Daughter           Emotional Assessment       Orientation: : Fluctuating Orientation (Suspected and/or reported Sundowners) Alcohol / Substance Use: Not Applicable Psych Involvement: No (comment)  Admission diagnosis:  Acute respiratory failure with hypoxia (HCC) [J96.01] COVID-19 virus infection [U07.1] Pneumonia due to COVID-19 virus [U07.1, J12.82] COVID-19 [U07.1] Patient Active Problem List   Diagnosis Date Noted  . Acute respiratory failure due to COVID-19 (HCC) 03/30/2020  . Pneumonia due to COVID-19 virus 03/30/2020  . COVID-19 03/30/2020  . Dementia (HCC)   . Hyperlipidemia 12/05/2019  . Estrogen deficiency 10/20/2018  . Urinary frequency 11/30/2017  . Trigger middle finger of left hand 11/30/2017  . Seizures (HCC) 11/29/2017   PCP:  [name], MD Pharmacy:   [facility] [phone] - [place], [place] - [phone] AT [facility] [address] & [address] [place] [address] Phone: [phone] Fax: [phone]     Social Determinants of Health (SDOH) Interventions    Readmission Risk Interventions No flowsheet data found.

## 2020-04-03 NOTE — Progress Notes (Signed)
Pt is alert confused. But able to orient and redirect. She  Is conversing when spoken to but very little. Pt remains oxygen dependent and does desat quickly if oxygen removed. Pt also has mitts on to ensure no pulling on tubes. Speech did eval today and wrote for new diet of puree and nectar thick liquids. ( see order). IV meds given without difficulty. Oral meds required crushing in pudding and applesauce and administered slowly to allow patient time to swallow.  Pure wick in place, draining clear yellow urine into suction canister. Palliative called for update and plans to speak with daughter regarding plan of care. Will continue to monitor.

## 2020-04-03 NOTE — Progress Notes (Signed)
Pharmacy Antibiotic Note  Christina Russell is a 69 y.o. female admitted on 03/30/2020 with pneumonia.  Pharmacy has been consulted for unasyn dosing.  Pt is s/p remdesivir for COVID PNA. She continues to require HFNC and possible aspiration. Unasyn as been ordered for asp PNA.   Scr <1 Wbc 13 (on steroids)  Plan: Unasyn 3g IV q6  Height: 5\' 2"  (157.5 cm) Weight: 63.8 kg (140 lb 10.5 oz) IBW/kg (Calculated) : 50.1  Temp (24hrs), Avg:98.7 F (37.1 C), Min:98.1 F (36.7 C), Max:99.1 F (37.3 C)  Recent Labs  Lab 03/30/20 1201 03/30/20 1204 03/30/20 1408 03/31/20 0337 04/01/20 0829 04/02/20 0326 04/03/20 0452  WBC  --  7.4  --  5.6 8.0 11.3* 13.0*  CREATININE  --  0.71  --  0.61 0.62 0.63 0.71  LATICACIDVEN 2.2*  --  1.1  --   --   --   --     Estimated Creatinine Clearance: 58.3 mL/min (by C-G formula based on SCr of 0.71 mg/dL).    No Known Allergies  Antimicrobials this admission: 8/19 unasyn>>  Dose adjustments this admission:   Microbiology results: 8/15 blood>>ngtd 8/16 MRSA>>neg  9/16, PharmD, [place], AAHIVP, CPP Infectious Disease Pharmacist 04/03/2020 9:39 AM

## 2020-04-03 NOTE — Progress Notes (Signed)
Pt is confused and keeps removing [place], pt desats into the lower 70s, but quickly recovers when [place] is placed back on pt. Pt currently 91% on 40L heated high flow, will CTM.

## 2020-04-03 NOTE — Progress Notes (Signed)
PMT consult received and chart reviewed. Discussed in detail with RN. Attempted to call daughter, Marcelino Duster to discuss goals of care. Voicemail left. Did not hear back from her this afternoon. PMT provider will attempt again tomorrow. Thank you.   NO CHARGE  Vennie Homans, DNP, FNP-C Palliative Medicine Team  Phone: [phone] Fax: [phone]

## 2020-04-03 NOTE — Evaluation (Signed)
Clinical/Bedside Swallow Evaluation Patient Details  Name: Christina Russell MRN: [id number] Date of Birth: 1951/07/30  Today's Date: 04/03/2020 Time: SLP Start Time (ACUTE ONLY): 1412 SLP Stop Time (ACUTE ONLY): 1433 SLP Time Calculation (min) (ACUTE ONLY): 21 min  Past Medical History:  Past Medical History:  Diagnosis Date  . COVID-19 03/27/2020  . Dementia (HCC)   . HLD (hyperlipidemia)    Past Surgical History:  Past Surgical History:  Procedure Laterality Date  . CATARACT EXTRACTION, BILATERAL     HPI:  69 year old African-American female unvaccinated with history of dementia, dyslipidemia who presented to the hospital with shortness of breath.  She was diagnosed with acute hypoxic respiratory failure due to COVID-19 pneumonia and admitted to ICU on heated high flow. Per MD note has developed some CHF.     Assessment / Plan / Recommendation Clinical Impression  Pt's baseline O2 saturation was 91% and respiratory effort was stable. She does become dyspneic during activity and after highly suspected aspiration episode when she coughed immediately after water. Her 02 sats decreased to 85% but rose with additional time. Nectar thickened juice consumed without coughing or throat clearing given tactile assist to decrease volume. She had minimal residue in oral cavity with puree. To conserve energy, diet will be downgraded to Dys 1 (puree), nectar thick liquids, no straws and full assist. ST will continue to follow.    SLP Visit Diagnosis: Dysphagia, unspecified (R13.10)    Aspiration Risk  Moderate aspiration risk    Diet Recommendation Nectar-thick liquid;Dysphagia 1 (Puree)   Medication Administration: Crushed with puree Supervision: Staff to assist with self feeding;Full supervision/cueing for compensatory strategies Compensations: Slow rate;Minimize environmental distractions;Small sips/bites;Lingual sweep for clearance of pocketing    Other  Recommendations Oral Care  Recommendations: Oral care BID Other Recommendations: Order thickener from pharmacy   Follow up Recommendations Other (comment) (TBD)      Frequency and Duration min 2x/week  2 weeks       Prognosis Prognosis for Safe Diet Advancement:  (fair-good) Barriers to Reach Goals: Cognitive deficits      Swallow Study   General HPI: 69 year old African-American female unvaccinated with history of dementia, dyslipidemia who presented to the hospital with shortness of breath.  She was diagnosed with acute hypoxic respiratory failure due to COVID-19 pneumonia and admitted to ICU on heated high flow. Per MD note has developed some CHF.   Type of Study: Bedside Swallow Evaluation Previous Swallow Assessment: none Diet Prior to this Study: Thin liquids;Other (Comment) (soft) Respiratory Status:  (HFNC 40 L 100% ) History of Recent Intubation: No Behavior/Cognition: Alert;Cooperative;Pleasant mood;Requires cueing Oral Cavity Assessment: Other (comment) (lingual discoloration) Oral Care Completed by SLP: No Oral Cavity - Dentition: Poor condition;Missing dentition Vision: Functional for self-feeding Self-Feeding Abilities: Able to feed self;Needs assist Patient Positioning: Upright in bed Baseline Vocal Quality: Normal Volitional Cough: Strong Volitional Swallow: Able to elicit    Oral/Motor/Sensory Function Overall Oral Motor/Sensory Function: Within functional limits   Ice Chips Ice chips: Not tested   Thin Liquid Thin Liquid: Impaired Presentation: Cup Pharyngeal  Phase Impairments: Cough - Immediate    Nectar Thick Nectar Thick Liquid: Impaired Presentation: Cup Oral Phase Impairments: Reduced lingual movement/coordination Oral phase functional implications:  (mild residue) Pharyngeal Phase Impairments:  (no overt)   Honey Thick Honey Thick Liquid: Impaired Oral Phase Impairments: Reduced lingual movement/coordination   Puree Puree: Within functional limits Oral Phase Impairments:  Reduced lingual movement/coordination Pharyngeal Phase Impairments:  (none)   Solid  Solid: Not tested      Royce Macadamia 04/03/2020,3:46 PM Breck Coons Lonell Face.Ed Nurse, children's [phone] Office [phone]

## 2020-04-04 LAB — CULTURE, BLOOD (ROUTINE X 2)
Culture: NO GROWTH
Culture: NO GROWTH

## 2020-04-04 LAB — CBC WITH DIFFERENTIAL/PLATELET
Abs Immature Granulocytes: 0.29 10*3/uL — ABNORMAL HIGH (ref 0.00–0.07)
Basophils Absolute: 0 10*3/uL (ref 0.0–0.1)
Basophils Relative: 0 %
Eosinophils Absolute: 0 10*3/uL (ref 0.0–0.5)
Eosinophils Relative: 0 %
HCT: 43.2 % (ref 36.0–46.0)
Hemoglobin: 13.6 g/dL (ref 12.0–15.0)
Immature Granulocytes: 2 %
Lymphocytes Relative: 7 %
Lymphs Abs: 0.9 10*3/uL (ref 0.7–4.0)
MCH: 29.1 pg (ref 26.0–34.0)
MCHC: 31.5 g/dL (ref 30.0–36.0)
MCV: 92.5 fL (ref 80.0–100.0)
Monocytes Absolute: 0.7 10*3/uL (ref 0.1–1.0)
Monocytes Relative: 5 %
Neutro Abs: 11 10*3/uL — ABNORMAL HIGH (ref 1.7–7.7)
Neutrophils Relative %: 86 %
Platelets: 261 10*3/uL (ref 150–400)
RBC: 4.67 MIL/uL (ref 3.87–5.11)
RDW: 14.2 % (ref 11.5–15.5)
WBC: 12.9 10*3/uL — ABNORMAL HIGH (ref 4.0–10.5)
nRBC: 0 % (ref 0.0–0.2)

## 2020-04-04 LAB — COMPREHENSIVE METABOLIC PANEL
ALT: 20 U/L (ref 0–44)
AST: 23 U/L (ref 15–41)
Albumin: 2.4 g/dL — ABNORMAL LOW (ref 3.5–5.0)
Alkaline Phosphatase: 63 U/L (ref 38–126)
Anion gap: 12 (ref 5–15)
BUN: 26 mg/dL — ABNORMAL HIGH (ref 8–23)
CO2: 28 mmol/L (ref 22–32)
Calcium: 8.5 mg/dL — ABNORMAL LOW (ref 8.9–10.3)
Chloride: 100 mmol/L (ref 98–111)
Creatinine, Ser: 0.78 mg/dL (ref 0.44–1.00)
GFR calc Af Amer: >60 mL/min (ref >60)
GFR calc non Af Amer: >60 mL/min (ref >60)
Glucose, Bld: 172 mg/dL — ABNORMAL HIGH (ref 70–99)
Potassium: 4 mmol/L (ref 3.5–5.1)
Sodium: 140 mmol/L (ref 135–145)
Total Bilirubin: 0.6 mg/dL (ref 0.3–1.2)
Total Protein: 7.6 g/dL (ref 6.5–8.1)

## 2020-04-04 LAB — BRAIN NATRIURETIC PEPTIDE: B Natriuretic Peptide: 35.6 pg/mL (ref 0.0–100.0)

## 2020-04-04 LAB — MAGNESIUM: Magnesium: 2.7 mg/dL — ABNORMAL HIGH (ref 1.7–2.4)

## 2020-04-04 LAB — D-DIMER, QUANTITATIVE: D-Dimer, Quant: 12.14 ug/mL-FEU — ABNORMAL HIGH (ref 0.00–0.50)

## 2020-04-04 LAB — C-REACTIVE PROTEIN: CRP: 22.1 mg/dL — ABNORMAL HIGH (ref <1.0)

## 2020-04-04 LAB — PROCALCITONIN: Procalcitonin: 0.11 ng/mL

## 2020-04-04 MED ORDER — FUROSEMIDE 10 MG/ML IJ SOLN
80.0000 mg | Freq: Once | INTRAMUSCULAR | Status: AC
  Administered 2020-04-04: 80 mg via INTRAVENOUS
  Filled 2020-04-04: qty 8

## 2020-04-04 NOTE — TOC Progression Note (Signed)
Transition of Care [facility]) - Progression Note    Patient Details  Name: Christina Russell MRN: [id number] Date of Birth: April 03, 1951  Transition [facility]) CM/SW Contact  Lockie Pares, RN Phone Number: 04/04/2020, 3:19 PM  Clinical Narrative:    Still acute, high oxygen requirements. Palliative has been consulted due to COVID in the setting of a patient with dementia and CHF with poor prognosi, for goals of care. Patients  daughter believes patient will do fine. Patient is a DNR, though we are still providing aggressive medical interventions. Will continue to follow for needs.   Expected Discharge Plan: Home w Home Health Services Barriers to Discharge: Continued Medical Work up  Expected Discharge Plan and Services Expected Discharge Plan: Home w Home Health Services   Discharge Planning Services: CM Consult                                           Social Determinants of Health (SDOH) Interventions    Readmission Risk Interventions No flowsheet data found.

## 2020-04-04 NOTE — Progress Notes (Addendum)
PROGRESS NOTE                                                                                                                                                                                                             Patient Demographics:    Christina Russell, is a 69 y.o. female, DOB - 11/23/1950, LTR:[id number]  Outpatient Primary MD for the patient is [name], MD    LOS - 5  Admit date - 03/30/2020    Chief Complaint  Patient presents with  . Covid/ Distress  . COVID positive       Brief Narrative 69 year old African-American female who is not vaccinated for Covid and has underlying history of dementia, dyslipidemia who presented to the hospital with shortness of breath.  She was diagnosed with acute hypoxic respiratory failure due to COVID-19 pneumonia and admitted to ICU on heated high flow.  She also developed some CHF.  She was stabilized and subsequently transferred to my service on 04/02/2020.   Subjective:   Patient in bed, appears comfortable, denies any headache, no fever, no chest pain or pressure, no shortness of breath , no abdominal pain. No focal weakness.   Assessment  & Plan :     1. Acute Hypoxic Resp. Failure due to Acute Covid 19 Viral Pneumonitis during the ongoing 2020 Covid 19 Pandemic - she unfortunately is unvaccinated and has incurred severe parenchymal injury, has been placed on steroids, Remdesivir and Baricitinib, on HHFL 35 - 40 Lit, monitor closely, does have some leukocytosis which likely could be due to steroids however with ongoing encephalopathy chances of some aspiration as well, placed on Unasyn, MRSA screen, speech eval, soft diet with elevated head of the bed for now.  Overall prognosis remains extremely guarded.  Palliative care also consulted as requested by the ICU team.  Encouraged the patient to sit up in chair in the daytime use I-S and flutter valve for pulmonary toiletry and  then prone in bed when at night.  Will advance activity and titrate down oxygen as possible.    SpO2: (!) 88 % (Pt eating breakfast.) O2 Flow Rate (L/min): 40 L/min FiO2 (%): 100 %    Recent Labs  Lab 03/30/20 1201 03/30/20 1202 03/30/20 1204 03/30/20 1204 03/30/20 1408 03/31/20 0337 04/01/20 0829 04/02/20 0326 04/03/20 0452 04/03/20 0720 04/04/20 0727  WBC  --   --  7.4   < >  --  5.6 8.0 11.3* 13.0*  --  12.9*  CRP  --   --  21.9*   < >  --  21.8* 15.1* 10.4* 22.5*  --  22.1*  DDIMER  --   --  2.43*   < >  --  3.48* >20.00* >20.00* >20.00*  --  12.14*  BNP  --   --  160.2*  --   --   --   --  85.4 43.3  --  35.6  PROCALCITON  --   --  0.11  --   --   --   --   --   --  0.21 0.11  LATICACIDVEN 2.2*  --   --   --  1.1  --   --   --   --   --   --   AST  --   --  38   < >  --  33 26 25 27   --  23  ALT  --   --  21   < >  --  22 22 22 23   --  20  ALKPHOS  --   --  48   < >  --  52 55 58 64  --  63  BILITOT  --   --  0.7   < >  --  0.7 0.7 0.9 1.2  --  0.6  ALBUMIN  --   --  2.6*   < >  --  2.1* 2.1* 2.5* 2.7*  --  2.4*  SARSCOV2NAA  --  POSITIVE*  --   --   --   --   --   --   --   --   --    < > = values in this interval not displayed.       2.  Extremely high D dimer - high risk for a clot, full dose Lovenox, -ve Leg [place], stable R Atrial pressure on TTE, too hypoxic for CTA.  3.  Underlying history of dementia.  At high risk for delirium, minimize narcotics and benzodiazepines, daughter has been counseled.  Currently does have mild toxic and metabolic encephalopathy on top of her dementia.  4.  Dyslipidemia.  Continue home dose statin.  5.  Acute on chronic diastolic heart failure with EF 50 to 55%.  Lasix to be continued and dose daily repeat 80mg  IV 04/04/20, continue low-dose beta-blocker.  Outpatient cardiology follow-up for some distal septal hypokinesis, continue combination of aspirin, statin and beta-blocker for secondary prevention for now.       Condition -  Extremely Guarded ++  Family Communication  :  Daughter Marcelino Duster - [phone] on 04/02/20, 04/03/20, 04/04/20 -no daughter has been told from day 1 that her mother's prognosis is extremely grave but she fails to understand, she says that her mother should do just fine.  updated granddaughter and Pollock Sink in detail [phone] on 04/04/2020.  She understands the grave prognosis.  Code Status : DNR - DW daughter - 04/03/20 - 9.23 am  Consults  :  PCCM  Procedures  :    Leg [place] - No DVT  TTE - 1. Abnormal septal motion with distal hypokinesis . Left ventricular ejection fraction, by estimation, is 50 to 55%. The left ventricle has low normal function. Left ventricular endocardial border not optimally defined to evaluate regional wall motion. Left ventricular diastolic parameters are indeterminate.  2. The mitral valve is normal in structure.  Mild mitral valve regurgitation.  3. The aortic valve is tricuspid. Aortic valve regurgitation is not visualized. Mild aortic valve sclerosis is present, with no evidence of aortic valve stenosis.  4. There is normal pulmonary artery systolic pressure.  5. The inferior vena cava is normal in size with greater than 50% respiratory variability, suggesting right atrial pressure of 3 mmHg.   PUD Prophylaxis :  PPI  Disposition Plan  :    Status is: Inpatient  Remains inpatient appropriate because:IV treatments appropriate due to intensity of illness or inability to take PO   Dispo: The patient is from: Home              Anticipated d/c is to: Home              Anticipated d/c date is: > 3 days              Patient currently is not medically stable to d/c.  DVT Prophylaxis  :  Lovenox   Lab Results  Component Value Date   PLT 261 04/04/2020    Diet :  Diet Order            DIET - DYS 1 Room service appropriate? No; Fluid consistency: Nectar Thick  Diet effective now                  Inpatient Medications  Scheduled Meds: . albuterol  2 puff  Inhalation TID  . vitamin C  500 mg Oral Daily  . atorvastatin  40 mg Oral Daily  . baricitinib  2 mg Oral Daily  . Chlorhexidine Gluconate Cloth  6 each Topical Daily  . donepezil  10 mg Oral QHS  . enoxaparin (LOVENOX) injection  60 mg Subcutaneous Q12H  . furosemide  80 mg Intravenous Once  . mouth rinse  15 mL Mouth Rinse BID  . methylPREDNISolone (SOLU-MEDROL) injection  60 mg Intravenous BID  . metoprolol tartrate  12.5 mg Oral BID  . nystatin  5 mL Mouth/Throat TID AC & HS  . pantoprazole  40 mg Oral Daily  . zinc sulfate  220 mg Oral Daily   Continuous Infusions: . ampicillin-sulbactam (UNASYN) IV 3 g (04/04/20 0924)   PRN Meds:.acetaminophen, chlorpheniramine-HYDROcodone, docusate sodium, [DISCONTINUED] ondansetron **OR** ondansetron (ZOFRAN) IV, polyethylene glycol, Resource ThickenUp Clear  Antibiotics  :    Anti-infectives (From admission, onward)   Start     Dose/Rate Route Frequency Ordered Stop   04/03/20 1000  Ampicillin-Sulbactam (UNASYN) 3 g in sodium chloride 0.9 % 100 mL IVPB        3 g 200 mL/hr over 30 Minutes Intravenous Every 6 hours 04/03/20 0935     03/31/20 1000  remdesivir 100 mg in sodium chloride 0.9 % 100 mL IVPB       "Followed by" Linked Group Details   100 mg 200 mL/hr over 30 Minutes Intravenous Daily 03/30/20 1445 04/03/20 0917   03/30/20 1445  remdesivir 200 mg in sodium chloride 0.9% 250 mL IVPB       "Followed by" Linked Group Details   200 mg 580 mL/hr over 30 Minutes Intravenous Once 03/30/20 1445 03/30/20 1714       Time Spent in minutes  30   Susa Raring M.D on 04/04/2020 at 11:21 AM  To page go to [email or web address] - password [email or web address]  [facility] -  Office  [phone]    See all Orders from today for further details    Objective:   Vitals:  04/04/20 0845 04/04/20 0903 04/04/20 0919 04/04/20 0937  BP: 136/78     Pulse: (!) 107     Resp: 20   18  Temp: 98.1 F (36.7 C)     TempSrc: Oral     SpO2:  90% (!) 88%    Weight:      Height:        Wt Readings from Last 3 Encounters:  04/02/20 63.8 kg  01/30/20 65.3 kg  12/05/19 66 kg     Intake/Output Summary (Last 24 hours) at 04/04/2020 1121 Last data filed at 04/04/2020 0900 Gross per 24 hour  Intake 1240 ml  Output 1600 ml  Net -360 ml     Physical Exam  Awake, confused, No new F.N deficits  [place].AT,PERRAL Supple Neck,No JVD, No cervical lymphadenopathy appriciated.  Symmetrical Chest wall movement, Good air movement bilaterally, +ve rales RRR,No Gallops, Rubs or new Murmurs, No Parasternal Heave +ve B.Sounds, Abd Soft, No tenderness, No organomegaly appriciated, No rebound - guarding or rigidity. No Cyanosis, Clubbing or edema, No new Rash or bruise     Data Review:    CBC Recent Labs  Lab 03/31/20 0337 04/01/20 0829 04/02/20 0326 04/03/20 0452 04/04/20 0727  WBC 5.6 8.0 11.3* 13.0* 12.9*  HGB 11.8* 12.1 13.3 14.8 13.6  HCT 36.3 37.3 41.3 46.2* 43.2  PLT 204 185 205 233 261  MCV 92.4 91.2 91.2 90.9 92.5  MCH 30.0 29.6 29.4 29.1 29.1  MCHC 32.5 32.4 32.2 32.0 31.5  RDW 14.0 13.8 13.8 14.0 14.2  LYMPHSABS 0.6* 0.6* 1.5 1.1 0.9  MONOABS 0.3 0.2 0.5 0.4 0.7  EOSABS 0.0 0.0 0.0 0.0 0.0  BASOSABS 0.0 0.0 0.0 0.0 0.0    Chemistries  Recent Labs  Lab 03/31/20 0337 04/01/20 0829 04/02/20 0326 04/03/20 0452 04/04/20 0727  NA 140 143 142 140 140  K 4.0 3.7 3.1* 4.2 4.0  CL 104 104 100 96* 100  CO2 25 29 29 28 28   GLUCOSE 131* 149* 102* 133* 172*  BUN 10 15 15 18  26*  CREATININE 0.61 0.62 0.63 0.71 0.78  CALCIUM 7.9* 7.8* 8.1* 8.6* 8.5*  AST 33 26 25 27 23   ALT 22 22 22 23 20   ALKPHOS 52 55 58 64 63  BILITOT 0.7 0.7 0.9 1.2 0.6  MG 2.3 2.5* 2.0 2.2 2.7*     ------------------------------------------------------------------------------------------------------------------ No results for input(s): CHOL, HDL, LDLCALC, TRIG, CHOLHDL, LDLDIRECT in the last 72 hours.  Lab Results  Component Value Date   HGBA1C 5.3  04/15/2017   ------------------------------------------------------------------------------------------------------------------ No results for input(s): TSH, T4TOTAL, T3FREE, THYROIDAB in the last 72 hours.  Invalid input(s): FREET3  Cardiac Enzymes No results for input(s): CKMB, TROPONINI, MYOGLOBIN in the last 168 hours.  Invalid input(s): CK ------------------------------------------------------------------------------------------------------------------    Component Value Date/Time   BNP 35.6 04/04/2020    Micro Results Recent Results (from the past 240 hour(s))  Blood Culture (routine x 2)     Status: None   Collection Time: 03/30/20 11:56 AM   Specimen: BLOOD RIGHT FOREARM  Result Value Ref Range Status   Specimen Description BLOOD RIGHT FOREARM  Final   Special Requests   Final    BOTTLES DRAWN AEROBIC AND ANAEROBIC Blood Culture results may not be optimal due to an excessive volume of blood received in culture bottles   Culture   Final    NO GROWTH 5 DAYS Performed at [facility], [address] [place], 04/01/20 [facility]  Report Status 04/04/2020 FINAL  Final  SARS Coronavirus 2 by RT PCR (hospital order, performed in [facility] hospital lab) Nasopharyngeal Nasopharyngeal Swab     Status: Abnormal   Collection Time: 03/30/20 12:02 PM   Specimen: Nasopharyngeal Swab  Result Value Ref Range Status   SARS Coronavirus 2 POSITIVE (A) NEGATIVE Final    Comment: RESULT CALLED TO, READ BACK BY AND VERIFIED WITH: RN E DIXON [id number] MLM (NOTE) SARS-CoV-2 target nucleic acids are DETECTED  SARS-CoV-2 RNA is generally detectable in upper respiratory specimens  during the acute phase of infection.  Positive results are indicative  of the presence of the identified virus, but do not rule out bacterial infection or co-infection with other pathogens not detected by the test.  Clinical correlation with patient history and  other diagnostic information is  necessary to determine patient infection status.  The expected result is negative.  Fact Sheet for Patients:   BoilerBrush.com.cy   Fact Sheet for Healthcare Providers:   https://pope.com/    This test is not yet approved or cleared by the [place] FDA and  has been authorized for detection and/or diagnosis of SARS-CoV-2 by FDA under an Emergency Use Authorization (EUA).  This EUA will remain in effect (meaning this test can b e used) for the duration of  the COVID-19 declaration under Section 564(b)(1) of the Act, 21 U.S.C. section 360-bbb-3(b)(1), unless the authorization is terminated or revoked sooner.  Performed at [facility], [address] [place], [place] [address]   Blood Culture (routine x 2)     Status: None   Collection Time: 03/30/20 12:27 PM   Specimen: BLOOD  Result Value Ref Range Status   Specimen Description BLOOD LEFT ANTECUBITAL  Final   Special Requests   Final    BOTTLES DRAWN AEROBIC ONLY Blood Culture results may not be optimal due to an inadequate volume of blood received in culture bottles   Culture   Final    NO GROWTH 5 DAYS Performed at [facility], [address] [place], [place] [address]    Report Status 04/04/2020 FINAL  Final  MRSA PCR Screening     Status: None   Collection Time: 03/31/20 12:01 AM   Specimen: Nasopharyngeal  Result Value Ref Range Status   MRSA by PCR NEGATIVE NEGATIVE Final    Comment:        The GeneXpert MRSA Assay (FDA approved for NASAL specimens only), is one component of a comprehensive MRSA colonization surveillance program. It is not intended to diagnose MRSA infection nor to guide or monitor treatment for MRSA infections. Performed at [facility], [address], [place], [place] [address]     Radiology Reports DG Chest [place] 1 View  Result Date: 04/03/2020 CLINICAL DATA:  History of COVID infection EXAM: PORTABLE CHEST 1 VIEW  COMPARISON:  April 02, 2020 FINDINGS: Images rotated to the LEFT. Accounting for this rotation cardiomediastinal contours are stable. Increasing opacity at the LEFT lung base compared to the prior study. Also with increased opacity at the RIGHT lung base. Air bronchograms are seen at the LEFT lung base on today's study as well. Exam is more similar to the study of August 15. Probable small LEFT effusion as before. On limited assessment skeletal structures are unremarkable. IMPRESSION: Bilateral opacities worse at the lung bases and worse in the LEFT lung base, similar to prior imaging in this patient with reported history of COVID-19 pneumonia. Electronically Signed  By: Donzetta Kohut M.D.   On: 04/03/2020 07:54   DG Chest Port 1 View  Result Date: 04/02/2020 CLINICAL DATA:  Acute respiratory failure with hypoxemia, COVID-19 EXAM: PORTABLE CHEST 1 VIEW COMPARISON:  Portable exam 0652 hours compared 03/30/2020 FINDINGS: Normal heart size, mediastinal contours, and pulmonary vascularity. Atherosclerotic calcification aorta. Scattered pulmonary infiltrates bilaterally consistent with multifocal pneumonia most confluent in the LEFT lower lobe. No pleural effusion or pneumothorax. Mild osseous demineralization. IMPRESSION: Persistent pulmonary infiltrates consistent with multifocal pneumonia and history of COVID-19, slightly improved. Electronically Signed   By: Ulyses Southward M.D.   On: 04/02/2020 08:34   DG Chest Port 1 View  Result Date: 03/30/2020 CLINICAL DATA:  Patient with history of COVID-19. Worsening shortness of breath. EXAM: PORTABLE CHEST 1 VIEW COMPARISON:  None. FINDINGS: Monitoring leads overlie the patient. Enlarged cardiac and mediastinal contours. Diffuse bilateral airspace opacities. Probable small left pleural effusion. No pneumothorax. IMPRESSION: 1. Cardiomegaly. 2. Diffuse bilateral airspace opacities most compatible with multifocal pneumonia. Electronically Signed   By: Annia Belt M.D.    On: 03/30/2020 12:51   VAS [place] LOWER EXTREMITY VENOUS (DVT)  Result Date: 04/02/2020  Lower Venous DVTStudy Other Indications: Covid positive with elevated d-dimer. Performing Technologist: Marilynne Halsted RDMS, RVT  Examination Guidelines: A complete evaluation includes B-mode imaging, spectral Doppler, color Doppler, and power Doppler as needed of all accessible portions of each vessel. Bilateral testing is considered an integral part of a complete examination. Limited examinations for reoccurring indications may be performed as noted. The reflux portion of the exam is performed with the patient in reverse Trendelenburg.  +---------+---------------+---------+-----------+----------+--------------+ RIGHT    CompressibilityPhasicitySpontaneityPropertiesThrombus Aging +---------+---------------+---------+-----------+----------+--------------+ CFV      Full           Yes      Yes                                 +---------+---------------+---------+-----------+----------+--------------+ SFJ      Full                                                        +---------+---------------+---------+-----------+----------+--------------+ FV Prox  Full                                                        +---------+---------------+---------+-----------+----------+--------------+ FV Mid   Full                                                        +---------+---------------+---------+-----------+----------+--------------+ FV DistalFull                                                        +---------+---------------+---------+-----------+----------+--------------+ PFV      Full                                                        +---------+---------------+---------+-----------+----------+--------------+  POP      Full           Yes      Yes                                 +---------+---------------+---------+-----------+----------+--------------+ PTV      Full                                                         +---------+---------------+---------+-----------+----------+--------------+ PERO     Full                                                        +---------+---------------+---------+-----------+----------+--------------+   +---------+---------------+---------+-----------+----------+--------------+ LEFT     CompressibilityPhasicitySpontaneityPropertiesThrombus Aging +---------+---------------+---------+-----------+----------+--------------+ CFV      Full           Yes      Yes                                 +---------+---------------+---------+-----------+----------+--------------+ SFJ      Full                                                        +---------+---------------+---------+-----------+----------+--------------+ FV Prox  Full                                                        +---------+---------------+---------+-----------+----------+--------------+ FV Mid   Full                                                        +---------+---------------+---------+-----------+----------+--------------+ FV DistalFull                                                        +---------+---------------+---------+-----------+----------+--------------+ PFV      Full                                                        +---------+---------------+---------+-----------+----------+--------------+ POP      Full           Yes      Yes                                 +---------+---------------+---------+-----------+----------+--------------+  PTV      Full                                                        +---------+---------------+---------+-----------+----------+--------------+ PERO     Full                                                        +---------+---------------+---------+-----------+----------+--------------+     Summary: BILATERAL: - No evidence of deep vein thrombosis seen in  the lower extremities, bilaterally. -No evidence of popliteal cyst, bilaterally.   *See table(s) above for measurements and observations. Electronically signed by Gretta Began MD on 04/02/2020 at 5:04:02 PM.    Final    ECHOCARDIOGRAM LIMITED  Result Date: 03/31/2020    ECHOCARDIOGRAM LIMITED REPORT   Patient Name:   Christina Russell Date of Exam: 03/31/2020 Medical Rec #:  [id number]     Height:       62.0 in Accession #:    [id number]    Weight:       142.2 lb Date of Birth:  [birth year]     BSA:          1.654 m Patient Age:    68 years      BP:           136/80 mmHg Patient Gender: F             HR:           86 bpm. Exam Location:  Inpatient Procedure: Limited Echo, Color Doppler and Cardiac Doppler Indications:    R06.9 DOE  History:        Patient has no prior history of Echocardiogram examinations.                 Risk Factors:Dyslipidemia.  Sonographer:    Irving Burton Senior RDCS Referring Phys: Humberto Russell  Sonographer Comments: COVID+ at time of study IMPRESSIONS  1. Abnormal septal motion with distal hypokinesis . Left ventricular ejection fraction, by estimation, is 50 to 55%. The left ventricle has low normal function. Left ventricular endocardial border not optimally defined to evaluate regional wall motion. Left ventricular diastolic parameters are indeterminate.  2. The mitral valve is normal in structure. Mild mitral valve regurgitation.  3. The aortic valve is tricuspid. Aortic valve regurgitation is not visualized. Mild aortic valve sclerosis is present, with no evidence of aortic valve stenosis.  4. There is normal pulmonary artery systolic pressure.  5. The inferior vena cava is normal in size with greater than 50% respiratory variability, suggesting right atrial pressure of 3 mmHg. FINDINGS  Left Ventricle: Abnormal septal motion with distal hypokinesis. Left ventricular ejection fraction, by estimation, is 50 to 55%. The left ventricle has low normal function. Left ventricular endocardial  border not optimally defined to evaluate regional wall motion. The left ventricular internal cavity size was normal in size. Right Ventricle: There is normal pulmonary artery systolic pressure. The tricuspid regurgitant velocity is 2.33 m/s, and with an assumed right atrial pressure of 3 mmHg, the estimated right ventricular systolic pressure is 24.7 mmHg. Mitral Valve: The mitral valve is normal in structure. There is mild  thickening of the mitral valve leaflet(s). There is mild calcification of the mitral valve leaflet(s). Mild mitral annular calcification. Mild mitral valve regurgitation. Tricuspid Valve: The tricuspid valve is normal in structure. Tricuspid valve regurgitation is mild. Aortic Valve: The aortic valve is tricuspid. Aortic valve regurgitation is not visualized. Mild aortic valve sclerosis is present, with no evidence of aortic valve stenosis. Venous: The inferior vena cava is normal in size with greater than 50% respiratory variability, suggesting right atrial pressure of 3 mmHg. IAS/Shunts: The interatrial septum was not well visualized. RIGHT VENTRICLE RV S prime:     10.80 cm/s TAPSE (M-mode): 1.7 cm AORTIC VALVE LVOT Vmax:   95.30 cm/s LVOT Vmean:  65.900 cm/s LVOT VTI:    0.180 m MITRAL VALVE               TRICUSPID VALVE MV Area (PHT): 4.21 cm    TR Peak grad:   21.7 mmHg MV Decel Time: 180 msec    TR Vmax:        233.00 cm/s MV E velocity: 57.70 cm/s MV A velocity: 73.20 cm/s  SHUNTS MV E/A ratio:  0.79        Systemic VTI: 0.18 m Charlton Haws MD Electronically signed by Charlton Haws MD Signature Date/Time: 03/31/2020/3:13:28 PM    Final

## 2020-04-04 NOTE — Progress Notes (Addendum)
  Speech Language Pathology Treatment: Dysphagia  Patient Details Name: Dorraine Ellender MRN: [id number] DOB: [birth year] Today's Date: 04/04/2020 Time: 4081-4481 SLP Time Calculation (min) (ACUTE ONLY): 16 min  Assessment / Plan / Recommendation Clinical Impression  Her respirations at baseline were less labored today and sats at 88% on arrival only decreasing to 87% throughout session. RR was stable. Self fed bites pudding and cup sips nectar thick juice phonating with food in mouth and immediately post swallow not resulting in s/s aspiration. Seemed better able to coordinate breathing and swallowing today. Her risk does persist in setting of dementia and Covid. Pt cannot store information for more than a given number of seconds due to dementia. Puree and nectar thick cups sips should be continued with swallow precautions and full supervision and assist.    HPI HPI: 69 year old African-American female unvaccinated with history of dementia, dyslipidemia who presented to the hospital with shortness of breath.  She was diagnosed with acute hypoxic respiratory failure due to COVID-19 pneumonia and admitted to ICU on heated high flow. Per MD note has developed some CHF.        SLP Plan  Continue with current plan of care       Recommendations  Diet recommendations: Dysphagia 1 (puree);Nectar-thick liquid Liquids provided via: Cup;No straw Medication Administration: Crushed with puree Supervision: Staff to assist with self feeding;Full supervision/cueing for compensatory strategies Compensations: Slow rate;Minimize environmental distractions;Small sips/bites;Lingual sweep for clearance of pocketing Postural Changes and/or Swallow Maneuvers: Seated upright 90 degrees                Oral Care Recommendations: Oral care BID Follow up Recommendations: Skilled Nursing facility SLP Visit Diagnosis: Dysphagia, unspecified (R13.10) Plan: Continue with current plan of care                        Royce Macadamia 04/04/2020, 11:25 AM   Breck Coons Lonell Face.Ed Nurse, children's [phone] Office [phone]

## 2020-04-04 NOTE — Progress Notes (Signed)
PMT provider attempted f/u for goals of care discussions. VM left for daughter. Awaiting return call.   NO CHARGE  Vennie Homans, DNP, FNP-C Palliative Medicine Team  Phone: [phone] Fax: [phone]

## 2020-04-05 LAB — BASIC METABOLIC PANEL
Anion gap: 15 (ref 5–15)
BUN: 30 mg/dL — ABNORMAL HIGH (ref 8–23)
CO2: 31 mmol/L (ref 22–32)
Calcium: 8.9 mg/dL (ref 8.9–10.3)
Chloride: 101 mmol/L (ref 98–111)
Creatinine, Ser: 0.77 mg/dL (ref 0.44–1.00)
GFR calc Af Amer: >60 mL/min (ref >60)
GFR calc non Af Amer: >60 mL/min (ref >60)
Glucose, Bld: 157 mg/dL — ABNORMAL HIGH (ref 70–99)
Potassium: 4 mmol/L (ref 3.5–5.1)
Sodium: 147 mmol/L — ABNORMAL HIGH (ref 135–145)

## 2020-04-05 LAB — BRAIN NATRIURETIC PEPTIDE: B Natriuretic Peptide: 50.7 pg/mL (ref 0.0–100.0)

## 2020-04-05 LAB — MAGNESIUM: Magnesium: 2.6 mg/dL — ABNORMAL HIGH (ref 1.7–2.4)

## 2020-04-05 LAB — C-REACTIVE PROTEIN: CRP: 11.9 mg/dL — ABNORMAL HIGH (ref <1.0)

## 2020-04-05 LAB — PROCALCITONIN: Procalcitonin: <0.1 ng/mL

## 2020-04-05 MED ORDER — FUROSEMIDE 10 MG/ML IJ SOLN
40.0000 mg | Freq: Once | INTRAMUSCULAR | Status: AC
  Administered 2020-04-05: 40 mg via INTRAVENOUS
  Filled 2020-04-05: qty 4

## 2020-04-05 NOTE — Progress Notes (Signed)
PROGRESS NOTE                                                                                                                                                                                                             Patient Demographics:    Christina Russell, is a 69 y.o. female, DOB - 1951-07-04, XBL:[id number]  Outpatient Primary MD for the patient is Christina Deeds, MD    LOS - 6  Admit date - 03/30/2020    Chief Complaint  Patient presents with  . Covid/ Distress  . COVID positive       Brief Narrative 69 year old African-American female who is not vaccinated for Covid and has underlying history of dementia, dyslipidemia who presented to the hospital with shortness of breath.  She was diagnosed with acute hypoxic respiratory failure due to COVID-19 pneumonia and admitted to ICU on heated high flow.  She also developed some CHF.  She was stabilized and subsequently transferred to my service on 04/02/2020.   Subjective:   Patient in bed does not appear to be in any distress today, she is slightly confused but is able to tell me that she has no chest pain or shortness of breath.   Assessment  & Plan :     1. Acute Hypoxic Resp. Failure due to Acute Covid 19 Viral Pneumonitis during the ongoing 2020 Covid 19 Pandemic - she unfortunately is unvaccinated and has incurred severe parenchymal injury, has been placed on steroids, Remdesivir and Baricitinib, on HHFL 55 Lit, monitor closely, does have some leukocytosis which likely could be due to steroids however with ongoing encephalopathy chances of some aspiration as well, placed on Unasyn, MRSA screen -ve, speech eval, soft diet with elevated head of the bed for now.  Overall prognosis remains extremely guarded, clearly explained to the grand daughter, she is DNR if declines further comfort measures.  Palliative care also consulted as requested by the ICU team.  Encouraged the  patient to sit up in chair in the daytime use I-S and flutter valve for pulmonary toiletry and then prone in bed when at night.  Will advance activity and titrate down oxygen as possible.    SpO2: 91 % O2 Flow Rate (L/min): 55 L/min FiO2 (%): 100 %    Recent Labs  Lab 03/30/20 1201 03/30/20 1202 03/30/20 1204 03/30/20 1204 03/30/20  1408 03/31/20 0337 03/31/20 1610 04/01/20 0829 04/02/20 0326 04/03/20 0452 04/03/20 0720 04/04/20 0727 04/05/20 0658 04/05/20 0745  WBC  --   --  7.4   < >  --  5.6  --  8.0 11.3* 13.0*  --  12.9*  --   --   CRP  --   --  21.9*   < >  --  21.8*   < > 15.1* 10.4* 22.5*  --  22.1*  --  11.9*  DDIMER  --   --  2.43*   < >  --  3.48*  --  >20.00* >20.00* >20.00*  --  12.14*  --   --   BNP  --   --  160.2*  --   --   --   --   --  85.4 43.3  --  35.6 50.7  --   PROCALCITON  --   --  0.11  --   --   --   --   --   --   --  0.21 0.11 <0.10  --   LATICACIDVEN 2.2*  --   --   --  1.1  --   --   --   --   --   --   --   --   --   AST  --   --  38   < >  --  33  --  --  23  --   --   ALT  --   --  21   < >  --  22  --  --  20  --   --   ALKPHOS  --   --  48   < >  --  52  --  55 58 64  --  63  --   --   BILITOT  --   --  0.7   < >  --  0.7  --  0.7 0.9 1.2  --  0.6  --   --   ALBUMIN  --   --  2.6*   < >  --  2.1*  --  2.1* 2.5* 2.7*  --  2.4*  --   --   SARSCOV2NAA  --  POSITIVE*  --   --   --   --   --   --   --   --   --   --   --   --    < > = values in this interval not displayed.       2.  Extremely high D dimer - high risk for a clot, full dose Lovenox, -ve Leg [place], stable R Atrial pressure on TTE, too hypoxic for CTA.  3.  Underlying history of dementia.  At high risk for delirium, minimize narcotics and benzodiazepines, daughter has been counseled.  Currently does have mild toxic and metabolic encephalopathy on top of her dementia.  4.  Dyslipidemia.  Continue home dose statin.  5.  Acute on chronic diastolic heart failure  with EF 50 to 55%.  Lasix to be continued and dose daily repeat  IV 04/05/20, continue low-dose beta-blocker.  Outpatient cardiology follow-up for some distal septal hypokinesis, continue combination of aspirin, statin and beta-blocker for secondary prevention for now.       Condition - Extremely Guarded ++  Family Communication  :  Daughter Christina Russell [phone] on 04/02/20, 04/03/20, 04/04/20 -no  daughter has been told from day 1 that her mother's prognosis is extremely grave but she fails to understand, she says that her mother should do just fine.  Updated granddaughter and Christina Russell in detail [phone] on 04/04/2020, 04/05/20.  She understands the grave prognosis, agrees if any further decline or suffering comfort measures.   Code Status : DNR - DW daughter - 04/03/20 - 9.23 am  Consults  :  PCCM  Procedures  :    Leg [place] - No DVT  TTE - 1. Abnormal septal motion with distal hypokinesis . Left ventricular ejection fraction, by estimation, is 50 to 55%. The left ventricle has low normal function. Left ventricular endocardial border not optimally defined to evaluate regional wall motion. Left ventricular diastolic parameters are indeterminate.  2. The mitral valve is normal in structure. Mild mitral valve regurgitation.  3. The aortic valve is tricuspid. Aortic valve regurgitation is not visualized. Mild aortic valve sclerosis is present, with no evidence of aortic valve stenosis.  4. There is normal pulmonary artery systolic pressure.  5. The inferior vena cava is normal in size with greater than 50% respiratory variability, suggesting right atrial pressure of 3 mmHg.   PUD Prophylaxis :  PPI  Disposition Plan  :    Status is: Inpatient  Remains inpatient appropriate because:IV treatments appropriate due to intensity of illness or inability to take PO   Dispo: The patient is from: Home              Anticipated d/c is to: Home              Anticipated d/c date is: > 3 days               Patient currently is not medically stable to d/c.  DVT Prophylaxis  :  Lovenox   Lab Results  Component Value Date   PLT 261 04/04/2020    Diet :  Diet Order            DIET - DYS 1 Room service appropriate? No; Fluid consistency: Nectar Thick  Diet effective now                  Inpatient Medications  Scheduled Meds: . albuterol  2 puff Inhalation TID  . vitamin C  500 mg Oral Daily  . atorvastatin  40 mg Oral Daily  . baricitinib  2 mg Oral Daily  . Chlorhexidine Gluconate Cloth  6 each Topical Daily  . donepezil  10 mg Oral QHS  . enoxaparin (LOVENOX) injection  60 mg Subcutaneous Q12H  . mouth rinse  15 mL Mouth Rinse BID  . methylPREDNISolone (SOLU-MEDROL) injection  60 mg Intravenous BID  . metoprolol tartrate  12.5 mg Oral BID  . nystatin  5 mL Mouth/Throat TID AC & HS  . pantoprazole  40 mg Oral Daily  . zinc sulfate  220 mg Oral Daily   Continuous Infusions: . ampicillin-sulbactam (UNASYN) IV 3 g (04/05/20 0354)   PRN Meds:.acetaminophen, chlorpheniramine-HYDROcodone, docusate sodium, [DISCONTINUED] ondansetron **OR** ondansetron (ZOFRAN) IV, polyethylene glycol, Resource ThickenUp Clear  Antibiotics  :    Anti-infectives (From admission, onward)   Start     Dose/Rate Route Frequency Ordered Stop   04/03/20 1000  Ampicillin-Sulbactam (UNASYN) 3 g in sodium chloride 0.9 % 100 mL IVPB        3 g 200 mL/hr over 30 Minutes Intravenous Every 6 hours 04/03/20 0935     03/31/20 1000  remdesivir 100 mg  in sodium chloride 0.9 % 100 mL IVPB       "Followed by" Linked Group Details   100 mg 200 mL/hr over 30 Minutes Intravenous Daily 03/30/20 1445 04/03/20 0917   03/30/20 1445  remdesivir 200 mg in sodium chloride 0.9% 250 mL IVPB       "Followed by" Linked Group Details   200 mg 580 mL/hr over 30 Minutes Intravenous Once 03/30/20 1445 03/30/20 1714       Time Spent in minutes  30   Susa Raring M.D on 04/05/2020 at 9:47 AM  To page go to [email or web address] -  password [facility]  [facility] -  Office  [phone]    See all Orders from today for further details    Objective:   Vitals:   04/05/20 0551 04/05/20 0800 04/05/20 0810 04/05/20 0819  BP:  (!) 131/92    Pulse:  (!) 108 (!) 112 (!) 103  Resp:  19 (!) 26 18  Temp:  98.4 F (36.9 C)    TempSrc:  Oral    SpO2: (!) 82% (!) 82% 91% 91%  Weight:      Height:        Wt Readings from Last 3 Encounters:  04/05/20 61.8 kg  01/30/20 65.3 kg  12/05/19 66 kg     Intake/Output Summary (Last 24 hours) at 04/05/2020 0947 Last data filed at 04/05/2020 0500 Gross per 24 hour  Intake 200 ml  Output 1250 ml  Net -1050 ml     Physical Exam  Awake but confused, No new F.N deficits,  [place].AT,PERRAL Supple Neck,No JVD, No cervical lymphadenopathy appriciated.  Symmetrical Chest wall movement, Good air movement bilaterally, ++ rales RRR,No Gallops, Rubs or new Murmurs, No Parasternal Heave +ve B.Sounds, Abd Soft, No tenderness, No organomegaly appriciated, No rebound - guarding or rigidity. No Cyanosis, Clubbing or edema, No new Rash or bruise     Data Review:    CBC Recent Labs  Lab 03/31/20 0337 04/01/20 0829 04/02/20 0326 04/03/20 0452 04/04/20 0727  WBC 5.6 8.0 11.3* 13.0* 12.9*  HGB 11.8* 12.1 13.3 14.8 13.6  HCT 36.3 37.3 41.3 46.2* 43.2  PLT 204 185 205 233 261  MCV 92.4 91.2 91.2 90.9 92.5  MCH 30.0 29.6 29.4 29.1 29.1  MCHC 32.5 32.4 32.2 32.0 31.5  RDW 14.0 13.8 13.8 14.0 14.2  LYMPHSABS 0.6* 0.6* 1.5 1.1 0.9  MONOABS 0.3 0.2 0.5 0.4 0.7  EOSABS 0.0 0.0 0.0 0.0 0.0  BASOSABS 0.0 0.0 0.0 0.0 0.0    Chemistries  Recent Labs  Lab 03/31/20 0337 03/31/20 0337 04/01/20 0829 04/02/20 0326 04/03/20 0452 04/04/20 0727 04/05/20 0745  NA 140   < > 143 142 140 140 147*  K 4.0   < > 3.7 3.1* 4.2 4.0 4.0  CL 104   < > 104 100 96* 100 101  CO2 25   < > GLUCOSE 131*   < > 149* 102* 133* 172* 157*  BUN 10   < > 26* 30*  CREATININE  0.61   < > 0.62 0.63 0.71 0.78 0.77  CALCIUM 7.9*   < > 7.8* 8.1* 8.6* 8.5* 8.9  AST 33  --  --   ALT 22  --  --   ALKPHOS 52  --  55 58 64 63  --   BILITOT 0.7  --  0.7 0.9 1.2  0.6  --   MG 2.3   < > 2.5* 2.0 2.2 2.7* 2.6*   < > = values in this interval not displayed.     ------------------------------------------------------------------------------------------------------------------ No results for input(s): CHOL, HDL, LDLCALC, TRIG, CHOLHDL, LDLDIRECT in the last 72 hours.  Lab Results  Component Value Date   HGBA1C 5.3 04/15/2017   ------------------------------------------------------------------------------------------------------------------ No results for input(s): TSH, T4TOTAL, T3FREE, THYROIDAB in the last 72 hours.  Invalid input(s): FREET3  Cardiac Enzymes No results for input(s): CKMB, TROPONINI, MYOGLOBIN in the last 168 hours.  Invalid input(s): CK ------------------------------------------------------------------------------------------------------------------    Component Value Date/Time   BNP 50.7 04/05/2020 4098    Micro Results Recent Results (from the past 240 hour(s))  Blood Culture (routine x 2)     Status: None   Collection Time: 03/30/20 11:56 AM   Specimen: BLOOD RIGHT FOREARM  Result Value Ref Range Status   Specimen Description BLOOD RIGHT FOREARM  Final   Special Requests   Final    BOTTLES DRAWN AEROBIC AND ANAEROBIC Blood Culture results may not be optimal due to an excessive volume of blood received in culture bottles   Culture   Final    NO GROWTH 5 DAYS Performed at [facility], [address] [place], [place] [address]    Report Status 04/04/2020 FINAL  Final  SARS Coronavirus 2 by RT PCR (hospital order, performed in [facility] hospital lab) Nasopharyngeal Nasopharyngeal Swab     Status: Abnormal   Collection Time: 03/30/20 12:02 PM   Specimen: Nasopharyngeal Swab  Result Value Ref Range Status    SARS Coronavirus 2 POSITIVE (A) NEGATIVE Final    Comment: RESULT CALLED TO, READ BACK BY AND VERIFIED WITH: RN E DIXON [id number] MLM (NOTE) SARS-CoV-2 target nucleic acids are DETECTED  SARS-CoV-2 RNA is generally detectable in upper respiratory specimens  during the acute phase of infection.  Positive results are indicative  of the presence of the identified virus, but do not rule out bacterial infection or co-infection with other pathogens not detected by the test.  Clinical correlation with patient history and  other diagnostic information is necessary to determine patient infection status.  The expected result is negative.  Fact Sheet for Patients:   BoilerBrush.com.cy   Fact Sheet for Healthcare Providers:   https://pope.com/    This test is not yet approved or cleared by the [place] FDA and  has been authorized for detection and/or diagnosis of SARS-CoV-2 by FDA under an Emergency Use Authorization (EUA).  This EUA will remain in effect (meaning this test can b e used) for the duration of  the COVID-19 declaration under Section 564(b)(1) of the Act, 21 U.S.C. section 360-bbb-3(b)(1), unless the authorization is terminated or revoked sooner.  Performed at [facility], [address], [place], [place] [address]   Blood Culture (routine x 2)     Status: None   Collection Time: 03/30/20 12:27 PM   Specimen: BLOOD  Result Value Ref Range Status   Specimen Description BLOOD LEFT ANTECUBITAL  Final   Special Requests   Final    BOTTLES DRAWN AEROBIC ONLY Blood Culture results may not be optimal due to an inadequate volume of blood received in culture bottles   Culture   Final    NO GROWTH 5 DAYS Performed at [facility], [address] [place], [place] [address]    Report Status 04/04/2020 FINAL  Final  MRSA PCR Screening  Status: None   Collection Time: 03/31/20 12:01 AM   Specimen: Nasopharyngeal    Result Value Ref Range Status   MRSA by PCR NEGATIVE NEGATIVE Final    Comment:        The GeneXpert MRSA Assay (FDA approved for NASAL specimens only), is one component of a comprehensive MRSA colonization surveillance program. It is not intended to diagnose MRSA infection nor to guide or monitor treatment for MRSA infections. Performed at [facility], [address] [place], [place] [address]     Radiology Reports DG Chest [place] 1 View  Result Date: 04/03/2020 CLINICAL DATA:  History of COVID infection EXAM: PORTABLE CHEST 1 VIEW COMPARISON:  April 02, 2020 FINDINGS: Images rotated to the LEFT. Accounting for this rotation cardiomediastinal contours are stable. Increasing opacity at the LEFT lung base compared to the prior study. Also with increased opacity at the RIGHT lung base. Air bronchograms are seen at the LEFT lung base on today's study as well. Exam is more similar to the study of August 15. Probable small LEFT effusion as before. On limited assessment skeletal structures are unremarkable. IMPRESSION: Bilateral opacities worse at the lung bases and worse in the LEFT lung base, similar to prior imaging in this patient with reported history of COVID-19 pneumonia. Electronically Signed   By: Donzetta Kohut M.D.   On: 04/03/2020 07:54   DG Chest Port 1 View  Result Date: 04/02/2020 CLINICAL DATA:  Acute respiratory failure with hypoxemia, COVID-19 EXAM: PORTABLE CHEST 1 VIEW COMPARISON:  Portable exam 0652 hours compared 03/30/2020 FINDINGS: Normal heart size, mediastinal contours, and pulmonary vascularity. Atherosclerotic calcification aorta. Scattered pulmonary infiltrates bilaterally consistent with multifocal pneumonia most confluent in the LEFT lower lobe. No pleural effusion or pneumothorax. Mild osseous demineralization. IMPRESSION: Persistent pulmonary infiltrates consistent with multifocal pneumonia and history of COVID-19, slightly improved. Electronically Signed    By: Ulyses Southward M.D.   On: 04/02/2020 08:34   DG Chest Port 1 View  Result Date: 03/30/2020 CLINICAL DATA:  Patient with history of COVID-19. Worsening shortness of breath. EXAM: PORTABLE CHEST 1 VIEW COMPARISON:  None. FINDINGS: Monitoring leads overlie the patient. Enlarged cardiac and mediastinal contours. Diffuse bilateral airspace opacities. Probable small left pleural effusion. No pneumothorax. IMPRESSION: 1. Cardiomegaly. 2. Diffuse bilateral airspace opacities most compatible with multifocal pneumonia. Electronically Signed   By: Annia Belt M.D.   On: 03/30/2020 12:51   VAS [place] LOWER EXTREMITY VENOUS (DVT)  Result Date: 04/02/2020  Lower Venous DVTStudy Other Indications: Covid positive with elevated d-dimer. Performing Technologist: Marilynne Halsted RDMS, RVT  Examination Guidelines: A complete evaluation includes B-mode imaging, spectral Doppler, color Doppler, and power Doppler as needed of all accessible portions of each vessel. Bilateral testing is considered an integral part of a complete examination. Limited examinations for reoccurring indications may be performed as noted. The reflux portion of the exam is performed with the patient in reverse Trendelenburg.  +---------+---------------+---------+-----------+----------+--------------+ RIGHT    CompressibilityPhasicitySpontaneityPropertiesThrombus Aging +---------+---------------+---------+-----------+----------+--------------+ CFV      Full           Yes      Yes                                 +---------+---------------+---------+-----------+----------+--------------+ SFJ      Full                                                        +---------+---------------+---------+-----------+----------+--------------+  FV Prox  Full                                                        +---------+---------------+---------+-----------+----------+--------------+ FV Mid   Full                                                         +---------+---------------+---------+-----------+----------+--------------+ FV DistalFull                                                        +---------+---------------+---------+-----------+----------+--------------+ PFV      Full                                                        +---------+---------------+---------+-----------+----------+--------------+ POP      Full           Yes      Yes                                 +---------+---------------+---------+-----------+----------+--------------+ PTV      Full                                                        +---------+---------------+---------+-----------+----------+--------------+ PERO     Full                                                        +---------+---------------+---------+-----------+----------+--------------+   +---------+---------------+---------+-----------+----------+--------------+ LEFT     CompressibilityPhasicitySpontaneityPropertiesThrombus Aging +---------+---------------+---------+-----------+----------+--------------+ CFV      Full           Yes      Yes                                 +---------+---------------+---------+-----------+----------+--------------+ SFJ      Full                                                        +---------+---------------+---------+-----------+----------+--------------+ FV Prox  Full                                                        +---------+---------------+---------+-----------+----------+--------------+  FV Mid   Full                                                        +---------+---------------+---------+-----------+----------+--------------+ FV DistalFull                                                        +---------+---------------+---------+-----------+----------+--------------+ PFV      Full                                                         +---------+---------------+---------+-----------+----------+--------------+ POP      Full           Yes      Yes                                 +---------+---------------+---------+-----------+----------+--------------+ PTV      Full                                                        +---------+---------------+---------+-----------+----------+--------------+ PERO     Full                                                        +---------+---------------+---------+-----------+----------+--------------+     Summary: BILATERAL: - No evidence of deep vein thrombosis seen in the lower extremities, bilaterally. -No evidence of popliteal cyst, bilaterally.   *See table(s) above for measurements and observations. Electronically signed by Gretta Began MD on 04/02/2020 at 5:04:02 PM.    Final    ECHOCARDIOGRAM LIMITED  Result Date: 03/31/2020    ECHOCARDIOGRAM LIMITED REPORT   Patient Name:   DEANDREA VANPELT Date of Exam: 03/31/2020 Medical Rec #:  [id number]     Height:       62.0 in Accession #:    [id number]    Weight:       142.2 lb Date of Birth:  Jan 12, 1951     BSA:          1.654 m Patient Age:    68 years      BP:           136/80 mmHg Patient Gender: F             HR:           86 bpm. Exam Location:  Inpatient Procedure: Limited Echo, Color Doppler and Cardiac Doppler Indications:    R06.9 DOE  History:        Patient has no prior history of Echocardiogram examinations.  Risk Factors:Dyslipidemia.  Sonographer:    Irving Burton Senior RDCS Referring Phys: Humberto Seals  Sonographer Comments: COVID+ at time of study IMPRESSIONS  1. Abnormal septal motion with distal hypokinesis . Left ventricular ejection fraction, by estimation, is 50 to 55%. The left ventricle has low normal function. Left ventricular endocardial border not optimally defined to evaluate regional wall motion. Left ventricular diastolic parameters are indeterminate.  2. The mitral valve is normal in structure.  Mild mitral valve regurgitation.  3. The aortic valve is tricuspid. Aortic valve regurgitation is not visualized. Mild aortic valve sclerosis is present, with no evidence of aortic valve stenosis.  4. There is normal pulmonary artery systolic pressure.  5. The inferior vena cava is normal in size with greater than 50% respiratory variability, suggesting right atrial pressure of 3 mmHg. FINDINGS  Left Ventricle: Abnormal septal motion with distal hypokinesis. Left ventricular ejection fraction, by estimation, is 50 to 55%. The left ventricle has low normal function. Left ventricular endocardial border not optimally defined to evaluate regional wall motion. The left ventricular internal cavity size was normal in size. Right Ventricle: There is normal pulmonary artery systolic pressure. The tricuspid regurgitant velocity is 2.33 m/s, and with an assumed right atrial pressure of 3 mmHg, the estimated right ventricular systolic pressure is 24.7 mmHg. Mitral Valve: The mitral valve is normal in structure. There is mild thickening of the mitral valve leaflet(s). There is mild calcification of the mitral valve leaflet(s). Mild mitral annular calcification. Mild mitral valve regurgitation. Tricuspid Valve: The tricuspid valve is normal in structure. Tricuspid valve regurgitation is mild. Aortic Valve: The aortic valve is tricuspid. Aortic valve regurgitation is not visualized. Mild aortic valve sclerosis is present, with no evidence of aortic valve stenosis. Venous: The inferior vena cava is normal in size with greater than 50% respiratory variability, suggesting right atrial pressure of 3 mmHg. IAS/Shunts: The interatrial septum was not well visualized. RIGHT VENTRICLE RV S prime:     10.80 cm/s TAPSE (M-mode): 1.7 cm AORTIC VALVE LVOT Vmax:   95.30 cm/s LVOT Vmean:  65.900 cm/s LVOT VTI:    0.180 m MITRAL VALVE               TRICUSPID VALVE MV Area (PHT): 4.21 cm    TR Peak grad:   21.7 mmHg MV Decel Time: 180 msec    TR  Vmax:        233.00 cm/s MV E velocity: 57.70 cm/s MV A velocity: 73.20 cm/s  SHUNTS MV E/A ratio:  0.79        Systemic VTI: 0.18 m Charlton Haws MD Electronically signed by Charlton Haws MD Signature Date/Time: 03/31/2020/3:13:28 PM    Final

## 2020-04-06 DIAGNOSIS — J9601 Acute respiratory failure with hypoxia: Secondary | ICD-10-CM

## 2020-04-06 DIAGNOSIS — Z515 Encounter for palliative care: Secondary | ICD-10-CM

## 2020-04-06 DIAGNOSIS — Z7189 Other specified counseling: Secondary | ICD-10-CM

## 2020-04-06 LAB — CBC WITH DIFFERENTIAL/PLATELET
Abs Immature Granulocytes: 0.16 10*3/uL — ABNORMAL HIGH (ref 0.00–0.07)
Basophils Absolute: 0 10*3/uL (ref 0.0–0.1)
Basophils Relative: 0 %
Eosinophils Absolute: 0 10*3/uL (ref 0.0–0.5)
Eosinophils Relative: 0 %
HCT: 42.8 % (ref 36.0–46.0)
Hemoglobin: 13.4 g/dL (ref 12.0–15.0)
Immature Granulocytes: 1 %
Lymphocytes Relative: 4 %
Lymphs Abs: 0.5 10*3/uL — ABNORMAL LOW (ref 0.7–4.0)
MCH: 29.5 pg (ref 26.0–34.0)
MCHC: 31.3 g/dL (ref 30.0–36.0)
MCV: 94.1 fL (ref 80.0–100.0)
Monocytes Absolute: 0.8 10*3/uL (ref 0.1–1.0)
Monocytes Relative: 5 %
Neutro Abs: 13.6 10*3/uL — ABNORMAL HIGH (ref 1.7–7.7)
Neutrophils Relative %: 90 %
Platelets: 311 10*3/uL (ref 150–400)
RBC: 4.55 MIL/uL (ref 3.87–5.11)
RDW: 14.2 % (ref 11.5–15.5)
WBC: 15.1 10*3/uL — ABNORMAL HIGH (ref 4.0–10.5)
nRBC: 0 % (ref 0.0–0.2)

## 2020-04-06 LAB — COMPREHENSIVE METABOLIC PANEL
ALT: 19 U/L (ref 0–44)
AST: 19 U/L (ref 15–41)
Albumin: 2.3 g/dL — ABNORMAL LOW (ref 3.5–5.0)
Alkaline Phosphatase: 57 U/L (ref 38–126)
Anion gap: 16 — ABNORMAL HIGH (ref 5–15)
BUN: 28 mg/dL — ABNORMAL HIGH (ref 8–23)
CO2: 29 mmol/L (ref 22–32)
Calcium: 9 mg/dL (ref 8.9–10.3)
Chloride: 100 mmol/L (ref 98–111)
Creatinine, Ser: 0.94 mg/dL (ref 0.44–1.00)
GFR calc Af Amer: >60 mL/min (ref >60)
GFR calc non Af Amer: >60 mL/min (ref >60)
Glucose, Bld: 356 mg/dL — ABNORMAL HIGH (ref 70–99)
Potassium: 3.7 mmol/L (ref 3.5–5.1)
Sodium: 145 mmol/L (ref 135–145)
Total Bilirubin: 0.3 mg/dL (ref 0.3–1.2)
Total Protein: 7 g/dL (ref 6.5–8.1)

## 2020-04-06 LAB — PROCALCITONIN: Procalcitonin: <0.1 ng/mL

## 2020-04-06 LAB — MAGNESIUM: Magnesium: 2.5 mg/dL — ABNORMAL HIGH (ref 1.7–2.4)

## 2020-04-06 LAB — BRAIN NATRIURETIC PEPTIDE: B Natriuretic Peptide: 57 pg/mL (ref 0.0–100.0)

## 2020-04-06 LAB — C-REACTIVE PROTEIN: CRP: 7.8 mg/dL — ABNORMAL HIGH (ref <1.0)

## 2020-04-06 LAB — D-DIMER, QUANTITATIVE: D-Dimer, Quant: 8.05 ug/mL-FEU — ABNORMAL HIGH (ref 0.00–0.50)

## 2020-04-06 MED ORDER — NYSTATIN 100000 UNIT/ML MT SUSP
5.0000 mL | Freq: Three times a day (TID) | OROMUCOSAL | Status: AC
  Administered 2020-04-06 – 2020-04-09 (×12): 500000 [IU] via OROMUCOSAL
  Filled 2020-04-06 (×8): qty 5

## 2020-04-06 NOTE — Progress Notes (Signed)
PROGRESS NOTE                                                                                                                                                                                                             Patient Demographics:    Christina Russell, is a 69 y.o. female, DOB - [birth year], UJW:[id number]  Outpatient Primary MD for the patient is [name], MD    LOS - 7  Admit date - 03/30/2020    Chief Complaint  Patient presents with   Covid/ Distress   COVID positive       Brief Narrative 69 year old African-American female who is not vaccinated for Covid and has underlying history of dementia, dyslipidemia who presented to the hospital with shortness of breath.  She was diagnosed with acute hypoxic respiratory failure due to COVID-19 pneumonia and admitted to ICU on heated high flow.  She also developed some CHF.  She was stabilized and subsequently transferred to my service on 04/02/2020.   Subjective:   Seen in bed appears to be confused, and in no distress but on extremely high heated high flow oxygen, denies any headache chest or abdominal pain but is somewhat of an unreliable historian due to her confusion.   Assessment  & Plan :     1. Acute Hypoxic Resp. Failure due to Acute Covid 19 Viral Pneumonitis during the ongoing 2020 Covid 19 Pandemic - she unfortunately is unvaccinated and has incurred severe parenchymal injury, has been placed on steroids, Remdesivir and Baricitinib, on HHFL 55 Lit, monitor closely, does have some leukocytosis which likely could be due to steroids however with ongoing encephalopathy chances of some aspiration as well, placed on Unasyn, MRSA screen -ve, speech eval, soft diet with elevated head of the bed for now.  Overall prognosis remains extremely guarded, clearly explained to the grand daughter, she is DNR if declines further comfort measures.  Palliative care also consulted as  requested by the ICU team.  Encouraged the patient to sit up in chair in the daytime use I-S and flutter valve for pulmonary toiletry and then prone in bed when at night.  Will advance activity and titrate down oxygen as possible.    SpO2: 95 % O2 Flow Rate (L/min): 50 L/min FiO2 (%): 100 %    Recent Labs  Lab 03/30/20 1201 03/30/20 1202  03/30/20 1204 03/30/20 1204 03/30/20 1408 03/31/20 0337 04/01/20 0829 04/02/20 0326 04/03/20 0452 04/03/20 0720 04/04/20 0727 04/05/20 0658 04/05/20 0745 04/06/20 0255  WBC  --   --  7.4  --   --    < > 8.0 11.3* 13.0*  --  12.9*  --   --  15.1*  CRP  --   --  21.9*   < >  --    < > 15.1* 10.4* 22.5*  --  22.1*  --  11.9* 7.8*  DDIMER  --   --  2.43*  --   --    < > >20.00* >20.00* >20.00*  --  12.14*  --   --  8.05*  BNP  --   --  160.2*   < >  --   --   --  85.4 43.3  --  35.6 50.7  --  57.0  PROCALCITON  --   --  0.11  --   --   --   --   --   --  0.21 0.11 <0.10  --  <0.10  LATICACIDVEN 2.2*  --   --   --  1.1  --   --   --   --   --   --   --   --   --   AST  --   --  38  --   --    < > 26 25 27   --  23  --   --  19  ALT  --   --  21  --   --    < > 22 22 23   --  20  --   --  19  ALKPHOS  --   --  48  --   --    < > 55 58 64  --  63  --   --  57  BILITOT  --   --  0.7  --   --    < > 0.7 0.9 1.2  --  0.6  --   --  0.3  ALBUMIN  --   --  2.6*  --   --    < > 2.1* 2.5* 2.7*  --  2.4*  --   --  2.3*  SARSCOV2NAA  --  POSITIVE*  --   --   --   --   --   --   --   --   --   --   --   --    < > = values in this interval not displayed.       2.  Extremely high D dimer - high risk for a clot, full dose Lovenox, -ve Leg , stable R Atrial pressure on TTE, too hypoxic for CTA.  3.  Underlying history of dementia.  At high risk for delirium, minimize narcotics and benzodiazepines, daughter has been counseled.  Currently does have mild toxic and metabolic encephalopathy on top of her dementia.  4.  Dyslipidemia.  Continue home dose  statin.  5.  Acute on chronic diastolic heart failure with EF 50 to 55%.  Lasix to be continued and dose daily repeat 60mg  IV 04/06/20, continue low-dose beta-blocker.  Outpatient cardiology follow-up for some distal septal hypokinesis, continue combination of aspirin, statin and beta-blocker for secondary prevention for now.       Condition - Extremely Guarded ++  Family Communication  :  Daughter [place] [phone] on 04/02/20, 04/03/20,  04/04/20 -no daughter has been told from day 1 that her mother's prognosis is extremely grave but she fails to understand, she says that her mother should do just fine.  Updated granddaughter and [place] in detail [phone] on 04/04/2020, 04/05/20.  She understands the grave prognosis, agrees if any further decline or suffering comfort measures.  Updated again 04/06/2020.   Code Status : DNR - DW daughter - 04/03/20 - 9.23 am  Consults  :  PCCM  Procedures  :    Leg [place] - No DVT  TTE - 1. Abnormal septal motion with distal hypokinesis . Left ventricular ejection fraction, by estimation, is 50 to 55%. The left ventricle has low normal function. Left ventricular endocardial border not optimally defined to evaluate regional wall motion. Left ventricular diastolic parameters are indeterminate.  2. The mitral valve is normal in structure. Mild mitral valve regurgitation.  3. The aortic valve is tricuspid. Aortic valve regurgitation is not visualized. Mild aortic valve sclerosis is present, with no evidence of aortic valve stenosis.  4. There is normal pulmonary artery systolic pressure.  5. The inferior vena cava is normal in size with greater than 50% respiratory variability, suggesting right atrial pressure of 3 mmHg.   PUD Prophylaxis :  PPI  Disposition Plan  :    Status is: Inpatient  Remains inpatient appropriate because:IV treatments appropriate due to intensity of illness or inability to take PO   Dispo: The patient is from: Home               Anticipated d/c is to: Home              Anticipated d/c date is: > 3 days              Patient currently is not medically stable to d/c.  DVT Prophylaxis  :  Lovenox   Lab Results  Component Value Date   PLT 311 04/06/2020    Diet :  Diet Order            DIET - DYS 1 Room service appropriate? No; Fluid consistency: Nectar Thick  Diet effective now                  Inpatient Medications  Scheduled Meds:  albuterol  2 puff Inhalation TID   vitamin C  500 mg Oral Daily   atorvastatin  40 mg Oral Daily   baricitinib  2 mg Oral Daily   donepezil  10 mg Oral QHS   enoxaparin (LOVENOX) injection  60 mg Subcutaneous Q12H   mouth rinse  15 mL Mouth Rinse BID   methylPREDNISolone (SOLU-MEDROL) injection  60 mg Intravenous BID   metoprolol tartrate  12.5 mg Oral BID   nystatin  5 mL Mouth/Throat TID AC & HS   pantoprazole  40 mg Oral Daily   zinc sulfate  220 mg Oral Daily   Continuous Infusions:  ampicillin-sulbactam (UNASYN) IV 3 g (04/06/20 0351)   PRN Meds:.acetaminophen, chlorpheniramine-HYDROcodone, docusate sodium, [DISCONTINUED] ondansetron **OR** ondansetron (ZOFRAN) IV, polyethylene glycol, Resource ThickenUp Clear  Antibiotics  :    Anti-infectives (From admission, onward)   Start     Dose/Rate Route Frequency Ordered Stop   04/03/20 1000  Ampicillin-Sulbactam (UNASYN) 3 g in sodium chloride 0.9 % 100 mL IVPB        3 g 200 mL/hr over 30 Minutes Intravenous Every 6 hours 04/03/20 0935     03/31/20 1000  remdesivir 100 mg in sodium chloride 0.9 %  100 mL IVPB       "Followed by" Linked Group Details   100 mg 200 mL/hr over 30 Minutes Intravenous Daily 03/30/20 1445 04/03/20 0917   03/30/20 1445  remdesivir 200 mg in sodium chloride 0.9% 250 mL IVPB       "Followed by" Linked Group Details   200 mg 580 mL/hr over 30 Minutes Intravenous Once 03/30/20 1445 03/30/20 1714       Time Spent in minutes  30   Susa Raring M.D on 04/06/2020 at 9:27  AM  To page go to [email or web address] - password [facility]  [facility] -  Office  [phone]    See all Orders from today for further details    Objective:   Vitals:   04/05/20 2025 04/06/20 0000 04/06/20 0223 04/06/20 0820  BP:  134/79 134/79 121/80  Pulse: (!) 110 94 (!) 102 100  Resp: (!) 22 15 19  (!) 22  Temp:  97.8 F (36.6 C)  98.4 F (36.9 C)  TempSrc:  Axillary  Axillary  SpO2: 90% 92% 90% 95%  Weight:      Height:        Wt Readings from Last 3 Encounters:  04/05/20 61.8 kg  01/30/20 65.3 kg  12/05/19 66 kg     Intake/Output Summary (Last 24 hours) at 04/06/2020 0927 Last data filed at 04/06/2020 0800 Gross per 24 hour  Intake 798.75 ml  Output 300 ml  Net 498.75 ml     Physical Exam  Awake but remains confused moving all 4 extremities by herself,  .AT,PERRAL Supple Neck,No JVD, No cervical lymphadenopathy appriciated.  Symmetrical Chest wall movement, Good air movement bilaterally, coarse bibasilar Rales RRR,No Gallops, Rubs or new Murmurs, No Parasternal Heave +ve B.Sounds, Abd Soft, No tenderness, No organomegaly appriciated, No rebound - guarding or rigidity. No Cyanosis, Clubbing or edema, No new Rash or bruise     Data Review:    CBC Recent Labs  Lab 04/01/20 0829 04/02/20 0326 04/03/20 0452 04/04/20 0727 04/06/20 0255  WBC 8.0 11.3* 13.0* 12.9* 15.1*  HGB 12.1 13.3 14.8 13.6 13.4  HCT 37.3 41.3 46.2* 43.2 42.8  PLT 185 205 233 261 311  MCV 91.2 91.2 90.9 92.5 94.1  MCH 29.6 29.4 29.1 29.1 29.5  MCHC 32.4 32.2 32.0 31.5 31.3  RDW 13.8 13.8 14.0 14.2 14.2  LYMPHSABS 0.6* 1.5 1.1 0.9 0.5*  MONOABS 0.2 0.5 0.4 0.7 0.8  EOSABS 0.0 0.0 0.0 0.0 0.0  BASOSABS 0.0 0.0 0.0 0.0 0.0    Chemistries  Recent Labs  Lab 04/01/20 0829 04/01/20 0829 04/02/20 0326 04/03/20 0452 04/04/20 0727 04/05/20 0745 04/06/20 0255  NA 143   < > 142 140 140 147* 145  K 3.7   < > 3.1* 4.2 4.0 4.0 3.7  CL 104   < > 100 96* 100 101 100  CO2 29    < > 29 28 28 31 29   GLUCOSE 149*   < > 102* 133* 172* 157* 356*  BUN 15   < > 15 18 26* 30* 28*  CREATININE 0.62   < > 0.63 0.71 0.78 0.77 0.94  CALCIUM 7.8*   < > 8.1* 8.6* 8.5* 8.9 9.0  AST 26  --  25 27 23   --  19  ALT 22  --  22 23 20   --  19  ALKPHOS 55  --  58 64 63  --  57  BILITOT 0.7  --  0.9 1.2 0.6  --  0.3  MG 2.5*   < > 2.0 2.2 2.7* 2.6* 2.5*   < > = values in this interval not displayed.     ------------------------------------------------------------------------------------------------------------------ No results for input(s): CHOL, HDL, LDLCALC, TRIG, CHOLHDL, LDLDIRECT in the last 72 hours.  Lab Results  Component Value Date   HGBA1C 5.3 04/15/2017   ------------------------------------------------------------------------------------------------------------------ No results for input(s): TSH, T4TOTAL, T3FREE, THYROIDAB in the last 72 hours.  Invalid input(s): FREET3  Cardiac Enzymes No results for input(s): CKMB, TROPONINI, MYOGLOBIN in the last 168 hours.  Invalid input(s): CK ------------------------------------------------------------------------------------------------------------------    Component Value Date/Time   BNP 57.0 04/06/2020 0255    Micro Results Recent Results (from the past 240 hour(s))  Blood Culture (routine x 2)     Status: None   Collection Time: 03/30/20 11:56 AM   Specimen: BLOOD RIGHT FOREARM  Result Value Ref Range Status   Specimen Description BLOOD RIGHT FOREARM  Final   Special Requests   Final    BOTTLES DRAWN AEROBIC AND ANAEROBIC Blood Culture results may not be optimal due to an excessive volume of blood received in culture bottles   Culture   Final    NO GROWTH 5 DAYS Performed at [facility], [address] [place], [place] [address]    Report Status 04/04/2020 FINAL  Final  SARS Coronavirus 2 by RT PCR (hospital order, performed in [facility] hospital lab) Nasopharyngeal Nasopharyngeal Swab     Status:  Abnormal   Collection Time: 03/30/20 12:02 PM   Specimen: Nasopharyngeal Swab  Result Value Ref Range Status   SARS Coronavirus 2 POSITIVE (A) NEGATIVE Final    Comment: RESULT CALLED TO, READ BACK BY AND VERIFIED WITH: RN E DIXON [id number] MLM (NOTE) SARS-CoV-2 target nucleic acids are DETECTED  SARS-CoV-2 RNA is generally detectable in upper respiratory specimens  during the acute phase of infection.  Positive results are indicative  of the presence of the identified virus, but do not rule out bacterial infection or co-infection with other pathogens not detected by the test.  Clinical correlation with patient history and  other diagnostic information is necessary to determine patient infection status.  The expected result is negative.  Fact Sheet for Patients:   BoilerBrush.com.cy   Fact Sheet for Healthcare Providers:   https://pope.com/    This test is not yet approved or cleared by the [place] FDA and  has been authorized for detection and/or diagnosis of SARS-CoV-2 by FDA under an Emergency Use Authorization (EUA).  This EUA will remain in effect (meaning this test can b e used) for the duration of  the COVID-19 declaration under Section 564(b)(1) of the Act, 21 U.S.C. section 360-bbb-3(b)(1), unless the authorization is terminated or revoked sooner.  Performed at [facility], [address], [place], [place] [address]   Blood Culture (routine x 2)     Status: None   Collection Time: 03/30/20 12:27 PM   Specimen: BLOOD  Result Value Ref Range Status   Specimen Description BLOOD LEFT ANTECUBITAL  Final   Special Requests   Final    BOTTLES DRAWN AEROBIC ONLY Blood Culture results may not be optimal due to an inadequate volume of blood received in culture bottles   Culture   Final    NO GROWTH 5 DAYS Performed at [facility], [address], [place], [place] [address]    Report Status 04/04/2020  FINAL  Final  MRSA PCR Screening     Status:  None   Collection Time: 03/31/20 12:01 AM   Specimen: Nasopharyngeal  Result Value Ref Range Status   MRSA by PCR NEGATIVE NEGATIVE Final    Comment:        The GeneXpert MRSA Assay (FDA approved for NASAL specimens only), is one component of a comprehensive MRSA colonization surveillance program. It is not intended to diagnose MRSA infection nor to guide or monitor treatment for MRSA infections. Performed at [facility], [address], [place], [place] [address]     Radiology Reports DG Chest Eleva 1 View  Result Date: 04/03/2020 CLINICAL DATA:  History of COVID infection EXAM: PORTABLE CHEST 1 VIEW COMPARISON:  April 02, 2020 FINDINGS: Images rotated to the LEFT. Accounting for this rotation cardiomediastinal contours are stable. Increasing opacity at the LEFT lung base compared to the prior study. Also with increased opacity at the RIGHT lung base. Air bronchograms are seen at the LEFT lung base on today's study as well. Exam is more similar to the study of August 15. Probable small LEFT effusion as before. On limited assessment skeletal structures are unremarkable. IMPRESSION: Bilateral opacities worse at the lung bases and worse in the LEFT lung base, similar to prior imaging in this patient with reported history of COVID-19 pneumonia. Electronically Signed   By: Donzetta Kohut M.D.   On: 04/03/2020 07:54   DG Chest Port 1 View  Result Date: 04/02/2020 CLINICAL DATA:  Acute respiratory failure with hypoxemia, COVID-19 EXAM: PORTABLE CHEST 1 VIEW COMPARISON:  Portable exam 0652 hours compared 03/30/2020 FINDINGS: Normal heart size, mediastinal contours, and pulmonary vascularity. Atherosclerotic calcification aorta. Scattered pulmonary infiltrates bilaterally consistent with multifocal pneumonia most confluent in the LEFT lower lobe. No pleural effusion or pneumothorax. Mild osseous demineralization. IMPRESSION: Persistent pulmonary  infiltrates consistent with multifocal pneumonia and history of COVID-19, slightly improved. Electronically Signed   By: Ulyses Southward M.D.   On: 04/02/2020 08:34   DG Chest Port 1 View  Result Date: 03/30/2020 CLINICAL DATA:  Patient with history of COVID-19. Worsening shortness of breath. EXAM: PORTABLE CHEST 1 VIEW COMPARISON:  None. FINDINGS: Monitoring leads overlie the patient. Enlarged cardiac and mediastinal contours. Diffuse bilateral airspace opacities. Probable small left pleural effusion. No pneumothorax. IMPRESSION: 1. Cardiomegaly. 2. Diffuse bilateral airspace opacities most compatible with multifocal pneumonia. Electronically Signed   By: Annia Belt M.D.   On: 03/30/2020 12:51   VAS [place] LOWER EXTREMITY VENOUS (DVT)  Result Date: 04/02/2020  Lower Venous DVTStudy Other Indications: Covid positive with elevated d-dimer. Performing Technologist: Marilynne Halsted RDMS, RVT  Examination Guidelines: A complete evaluation includes B-mode imaging, spectral Doppler, color Doppler, and power Doppler as needed of all accessible portions of each vessel. Bilateral testing is considered an integral part of a complete examination. Limited examinations for reoccurring indications may be performed as noted. The reflux portion of the exam is performed with the patient in reverse Trendelenburg.  +---------+---------------+---------+-----------+----------+--------------+  RIGHT     Compressibility Phasicity Spontaneity Properties Thrombus Aging  +---------+---------------+---------+-----------+----------+--------------+  CFV       Full            Yes       Yes                                    +---------+---------------+---------+-----------+----------+--------------+  SFJ       Full                                                             +---------+---------------+---------+-----------+----------+--------------+  FV Prox   Full                                                              +---------+---------------+---------+-----------+----------+--------------+  FV Mid    Full                                                             +---------+---------------+---------+-----------+----------+--------------+  FV Distal Full                                                             +---------+---------------+---------+-----------+----------+--------------+  PFV       Full                                                             +---------+---------------+---------+-----------+----------+--------------+  POP       Full            Yes       Yes                                    +---------+---------------+---------+-----------+----------+--------------+  PTV       Full                                                             +---------+---------------+---------+-----------+----------+--------------+  PERO      Full                                                             +---------+---------------+---------+-----------+----------+--------------+   +---------+---------------+---------+-----------+----------+--------------+  LEFT      Compressibility Phasicity Spontaneity Properties Thrombus Aging  +---------+---------------+---------+-----------+----------+--------------+  CFV       Full            Yes       Yes                                    +---------+---------------+---------+-----------+----------+--------------+  SFJ       Full                                                             +---------+---------------+---------+-----------+----------+--------------+  FV Prox   Full                                                             +---------+---------------+---------+-----------+----------+--------------+  FV Mid    Full                                                             +---------+---------------+---------+-----------+----------+--------------+  FV Distal Full                                                              +---------+---------------+---------+-----------+----------+--------------+  PFV       Full                                                             +---------+---------------+---------+-----------+----------+--------------+  POP       Full            Yes       Yes                                    +---------+---------------+---------+-----------+----------+--------------+  PTV       Full                                                             +---------+---------------+---------+-----------+----------+--------------+  PERO      Full                                                             +---------+---------------+---------+-----------+----------+--------------+     Summary: BILATERAL: - No evidence of deep vein thrombosis seen in the lower extremities, bilaterally. -No evidence of popliteal cyst, bilaterally.   *See table(s) above for measurements and observations. Electronically signed by Gretta Began MD on 04/02/2020 at 5:04:02 PM.    Final    ECHOCARDIOGRAM LIMITED  Result Date: 03/31/2020    ECHOCARDIOGRAM LIMITED REPORT   Patient Name:   Christina Russell Date of Exam: 03/31/2020 Medical Rec #:  [id number]     Height:       62.0 in Accession #:    [id number]    Weight:       142.2 lb Date of Birth:  [birth year]     BSA:  1.654 m Patient Age:    68 years      BP:           136/80 mmHg Patient Gender: F             HR:           86 bpm. Exam Location:  Inpatient Procedure: Limited Echo, Color Doppler and Cardiac Doppler Indications:    R06.9 DOE  History:        Patient has no prior history of Echocardiogram examinations.                 Risk Factors:Dyslipidemia.  Sonographer:    Irving Burton Senior RDCS Referring Phys: Humberto Seals  Sonographer Comments: COVID+ at time of study IMPRESSIONS  1. Abnormal septal motion with distal hypokinesis . Left ventricular ejection fraction, by estimation, is 50 to 55%. The left ventricle has low normal function. Left ventricular endocardial border not  optimally defined to evaluate regional wall motion. Left ventricular diastolic parameters are indeterminate.  2. The mitral valve is normal in structure. Mild mitral valve regurgitation.  3. The aortic valve is tricuspid. Aortic valve regurgitation is not visualized. Mild aortic valve sclerosis is present, with no evidence of aortic valve stenosis.  4. There is normal pulmonary artery systolic pressure.  5. The inferior vena cava is normal in size with greater than 50% respiratory variability, suggesting right atrial pressure of 3 mmHg. FINDINGS  Left Ventricle: Abnormal septal motion with distal hypokinesis. Left ventricular ejection fraction, by estimation, is 50 to 55%. The left ventricle has low normal function. Left ventricular endocardial border not optimally defined to evaluate regional wall motion. The left ventricular internal cavity size was normal in size. Right Ventricle: There is normal pulmonary artery systolic pressure. The tricuspid regurgitant velocity is 2.33 m/s, and with an assumed right atrial pressure of 3 mmHg, the estimated right ventricular systolic pressure is 24.7 mmHg. Mitral Valve: The mitral valve is normal in structure. There is mild thickening of the mitral valve leaflet(s). There is mild calcification of the mitral valve leaflet(s). Mild mitral annular calcification. Mild mitral valve regurgitation. Tricuspid Valve: The tricuspid valve is normal in structure. Tricuspid valve regurgitation is mild. Aortic Valve: The aortic valve is tricuspid. Aortic valve regurgitation is not visualized. Mild aortic valve sclerosis is present, with no evidence of aortic valve stenosis. Venous: The inferior vena cava is normal in size with greater than 50% respiratory variability, suggesting right atrial pressure of 3 mmHg. IAS/Shunts: The interatrial septum was not well visualized. RIGHT VENTRICLE RV S prime:     10.80 cm/s TAPSE (M-mode): 1.7 cm AORTIC VALVE LVOT Vmax:   95.30 cm/s LVOT Vmean:   65.900 cm/s LVOT VTI:    0.180 m MITRAL VALVE               TRICUSPID VALVE MV Area (PHT): 4.21 cm    TR Peak grad:   21.7 mmHg MV Decel Time: 180 msec    TR Vmax:        233.00 cm/s MV E velocity: 57.70 cm/s MV A velocity: 73.20 cm/s  SHUNTS MV E/A ratio:  0.79        Systemic VTI: 0.18 m Charlton Haws MD Electronically signed by Charlton Haws MD Signature Date/Time: 03/31/2020/3:13:28 PM    Final

## 2020-04-06 NOTE — Progress Notes (Signed)
Pharmacy Antibiotic Note  Christina Russell is a 69 y.o. female admitted on 03/30/2020 with pneumonia. Pharmacy has been consulted for unasyn dosing.  Pt remains on Unasyn day number 4, WBC remains elevated (on steroids), pt remains on high flow.  Plan: Unasyn 3g IV q6  Height: 5\' 2"  (157.5 cm) Weight: 61.8 kg (136 lb 3.9 oz) IBW/kg (Calculated) : 50.1  Temp (24hrs), Avg:98.3 F (36.8 C), Min:97.8 F (36.6 C), Max:98.9 F (37.2 C)  Recent Labs  Lab 03/30/20 1201 03/30/20 1204 03/30/20 1408 03/31/20 0337 04/01/20 0829 04/01/20 0829 04/02/20 0326 04/03/20 0452 04/04/20 0727 04/05/20 0745 04/06/20 0255  WBC  --    < >  --    < > 8.0  --  11.3* 13.0* 12.9*  --  15.1*  CREATININE  --    < >  --    < > 0.62   < > 0.63 0.71 0.78 0.77 0.94  LATICACIDVEN 2.2*  --  1.1  --   --   --   --   --   --   --   --    < > = values in this interval not displayed.    Estimated Creatinine Clearance: 48.2 mL/min (by C-G formula based on SCr of 0.94 mg/dL).    No Known Allergies  Antimicrobials this admission: 8/19 Unasyn>>   Microbiology results: 8/15 BCx>>ngtd 8/16 MRSA>>neg   9/16, PharmD, BCPS Clinical Pharmacist [phone] Please check AMION for all [facility] numbers 04/06/2020

## 2020-04-06 NOTE — Consult Note (Addendum)
Consultation Note Date: 04/06/2020   Patient Name: Christina Russell  DOB: 04-24-1951  MRN: [id number]  Age / Sex: 69 y.o., female  PCP: [name], MD Referring Physician: Leroy Sea, MD  Reason for Consultation: Establishing goals of care  HPI/Patient Profile: 69 y.o. female  with past medical history of dementia and hyperlipidemia admitted on 03/30/2020 with shortness of breath. Hospital admission for acute hypoxic respiratory failure secondary to COVID-19 pneumonia and developed CHF. Not clinically progressing despite aggressive medical management. Remains on high flow 55L. Patient is a DNR/DNI. Palliative medicine consultation for goals of are.   Clinical Assessment and Goals of Care:  I have reviewed medical records and discussed with care team. Christina Russell is awake this morning but remains confused with baseline dementia. She ate 75% of breakfast and taking pills. She remains on 55L HF oxygen and becomes short of breath with exertion. Per RN, she does not seem to be in pain or discomfort.   Spoke with granddaughter, Christina Russell via telephone.   Christina Russell informs me that the patient's eldest daughter Christina Russell) is also hospitalized on 5W with covid pneumonia therefore updates and decisions have been deferred to Christina Russell at this time.   I introduced Palliative Medicine as specialized medical care for people living with serious illness. It focuses on providing relief from the symptoms and stress of a serious illness.   Prior to admission, patient living home with husband of >50 years and daughter, Christina Russell. Christina Russell shares that the patient has 3 children but Christina Russell is ill with covid, her aunt is in poor health, and uncle incarcerated. Baseline dementia but prior to admit, able to complete ADL's independently with encouragement. Excellent appetite "don't miss a meal."   Discussed events leading up to  admission and course of hospitalization including diagnoses, interventions, plan of care, and unfortunate poor prognosis with lack of progression despite aggressive medical management. Christina Russell understands seriousness of her grandmother's condition following conversations with Dr. Thedore Mins.   We discussed code status. Christina Russell understands and agrees with decision for DNR/DNI code status, understanding poor prognosis and underlying dementia.   The difference between aggressive medical intervention and comfort care was considered. Discussed recommendation for shift to comfort, especially if her grandmother begins to struggle or suffer. Discussed hospice philosophy and options. Discussed visitor policy on covid unit.   Christina Russell plans to further discuss with her mother and aunt this afternoon. She shares that the patient's husband of >50 years would wish to visit her prior to consideration of comfort.   Questions and concerns were addressed. PMT contact information given.    ADDENDUM: Received call by from Christina Russell. She has a good understanding of diagnoses and poor prognosis and is trying to help her mother Christina Russell) process this information. Christina Russell lives in [place]. The earliest she can be in [place] is Wednesday. She is planning to come help her mother with decisions toward shift to comfort. Christina Russell requests we continue current plan of care until she can further discuss with her mother. Christina Russell also shares that her  uncle is incarcerated and requesting a letter that may help him receive visitation, as he has not seen her in 9 years. Ideally, Christina Russell would like to get Christina Russell home with hospice services so other family and friends could visit her before she passes. Reassured Christina Russell that I will continue to support and do what I can to get her home but may be challenging with high oxygen requirements. Reassured of ongoing updates and again if decline, our recommendation will be shift to comfort sooner.  Christina Russell has PMT contact information.    SUMMARY OF RECOMMENDATIONS    DNR/DNI. Continue current plan of care and medical management. Recommendation for shift to comfort if further decline.  Ongoing palliative discussions. Patient's daughter Christina Russell) is unfortunately hospitalized with covid also. Patient's granddaughter, Christina Russell, plans to further discuss prognosis and comfort recommendations with the family. Granddaughter is aware of poor prognosis.   PMT will continue to follow.   ADDENDUM: F/u with granddaughter. The earliest she will arrive in [place] is Wednesday. She request we continue current plan of care and medical management until she arrives to further assist her mother (patient's daughter) with decision making.   Code Status/Advance Care Planning:  DNR  Symptom Management:   Per attending  Palliative Prophylaxis:   Aspiration, Delirium Protocol, Frequent Pain Assessment, Oral Care and Turn Reposition  Psycho-social/Spiritual:   Desire for further Chaplaincy support: yes  Additional Recommendations: Caregiving  Support/Resources, Compassionate Wean Education and Education on Hospice  Prognosis:   Poor prognosis with acute respiratory failure secondary to covid pneumonia/aspiration pneumonia. Underlying dementia.  Discharge Planning: To Be Determined      Primary Diagnoses: Present on Admission: . Acute respiratory failure due to COVID-19 (HCC) . Pneumonia due to COVID-19 virus . Dementia (HCC) . COVID-19   I have reviewed the medical record, interviewed the patient and family, and examined the patient. The following aspects are pertinent.  Past Medical History:  Diagnosis Date  . COVID-19 03/27/2020  . Dementia (HCC)   . HLD (hyperlipidemia)    Social History   Socioeconomic History  . Marital status: Married    Spouse name: Not on file  . Number of children: 3  . Years of education: Not on file  . Highest education level: Not on file   Occupational History  . Occupation: retired  Tobacco Use  . Smoking status: Former Smoker    Types: Cigarettes    Quit date: 2017    Years since quitting: 4.6  . Smokeless tobacco: Current User    Types: Chew  Vaping Use  . Vaping Use: Never used  Substance and Sexual Activity  . Alcohol use: Not Currently  . Drug use: Never  . Sexual activity: Not on file  Other Topics Concern  . Not on file  Social History Narrative   Pt lives with her daughter and husband in 1 story apartment on the second floor   Has 3 adult children   Some college education   Retired Designer, industrial/product    Social Determinants of Health   Financial Resource Strain:   . Difficulty of Paying Living Expenses: Not on file  Food Insecurity:   . Worried About Programme researcher, broadcasting/film/video in the Last Year: Not on file  . Ran Out of Food in the Last Year: Not on file  Transportation Needs:   . Lack of Transportation (Medical): Not on file  . Lack of Transportation (Non-Medical): Not on file  Physical Activity:   . Days of Exercise  per Week: Not on file  . Minutes of Exercise per Session: Not on file  Stress:   . Feeling of Stress : Not on file  Social Connections:   . Frequency of Communication with Friends and Family: Not on file  . Frequency of Social Gatherings with Friends and Family: Not on file  . Attends Religious Services: Not on file  . Active Member of Clubs or Organizations: Not on file  . Attends Banker Meetings: Not on file  . Marital Status: Not on file   Family History  Problem Relation Age of Onset  . Cerebral aneurysm Mother   . Diabetes Sister   . Heart disease Sister   . Hypertension Sister   . Breast cancer Sister   . Breast cancer Daughter    Scheduled Meds: . albuterol  2 puff Inhalation TID  . vitamin C  500 mg Oral Daily  . atorvastatin  40 mg Oral Daily  . baricitinib  2 mg Oral Daily  . donepezil  10 mg Oral QHS  . enoxaparin (LOVENOX) injection  60 mg  Subcutaneous Q12H  . mouth rinse  15 mL Mouth Rinse BID  . methylPREDNISolone (SOLU-MEDROL) injection  60 mg Intravenous BID  . metoprolol tartrate  12.5 mg Oral BID  . nystatin  5 mL Mouth/Throat TID AC & HS  . pantoprazole  40 mg Oral Daily  . zinc sulfate  220 mg Oral Daily   Continuous Infusions: . ampicillin-sulbactam (UNASYN) IV 3 g (04/06/20 0351)   PRN Meds:.acetaminophen, chlorpheniramine-HYDROcodone, docusate sodium, [DISCONTINUED] ondansetron **OR** ondansetron (ZOFRAN) IV, polyethylene glycol, Resource ThickenUp Clear Medications Prior to Admission:  Prior to Admission medications   Medication Sig Start Date End Date Taking? Authorizing Provider  atorvastatin (LIPITOR) 40 MG tablet Take 1 tablet (40 mg total) by mouth daily. 12/05/19  Yes Garnette Gunner, MD  dextromethorphan-guaiFENesin [facility] DM) 30-600 MG 12hr tablet Take 1 tablet by mouth 2 (two) times daily as needed for cough.   Yes [provider]  donepezil (ARICEPT) 10 MG tablet Take 1/2 tablet daily for 2 weeks, then increase to 1 tablet daily Patient not taking: Reported on 03/30/2020 12/22/17   Van Clines, MD   No Known Allergies Review of Systems  Unable to perform ROS: Dementia   Physical Exam : COVID isolation  Vital Signs: BP 121/80   Pulse (!) 119   Temp 98.4 F (36.9 C) (Axillary)   Resp (!) 28   Ht 5\' 2"  (1.575 m)   Wt 61.8 kg   SpO2 93%   BMI 24.92 kg/m  Pain Scale: 0-10   Pain Score: 0-No pain   SpO2: SpO2: 93 % O2 Device:SpO2: 93 % O2 Flow Rate: .O2 Flow Rate (L/min): 40 L/min  IO: Intake/output summary:   Intake/Output Summary (Last 24 hours) at 04/06/2020 1018 Last data filed at 04/06/2020 0900 Gross per 24 hour  Intake 1158.75 ml  Output 300 ml  Net 858.75 ml    LBM: Last BM Date: 04/05/20 Baseline Weight: Weight: 65.3 kg Most recent weight: Weight: 61.8 kg     Palliative Assessment/Data: PPS 30%    Time Total: 04/07/20 Greater than 50%  of this time was  spent counseling and coordinating care related to the above assessment and plan.  The above conversation was completed via telephone due to visitor restrictions during COVID-19 pandemic. Thorough chart review and discussion with multidisciplinary team was completed as part of assessment. No physical examination was performed.   Signed  by:  Ihor Dow, DNP, FNP-C Palliative Medicine Team  Phone: [phone] Fax: [phone]   Please contact Palliative Medicine Team phone at [phone] for questions and concerns.  For individual provider: See Shea Evans

## 2020-04-07 ENCOUNTER — Inpatient Hospital Stay (HOSPITAL_COMMUNITY): Payer: Medicare Other

## 2020-04-07 LAB — CBC WITH DIFFERENTIAL/PLATELET
Abs Immature Granulocytes: 0.17 10*3/uL — ABNORMAL HIGH (ref 0.00–0.07)
Basophils Absolute: 0 10*3/uL (ref 0.0–0.1)
Basophils Relative: 0 %
Eosinophils Absolute: 0 10*3/uL (ref 0.0–0.5)
Eosinophils Relative: 0 %
HCT: 42.3 % (ref 36.0–46.0)
Hemoglobin: 13.1 g/dL (ref 12.0–15.0)
Immature Granulocytes: 1 %
Lymphocytes Relative: 6 %
Lymphs Abs: 0.9 10*3/uL (ref 0.7–4.0)
MCH: 29.1 pg (ref 26.0–34.0)
MCHC: 31 g/dL (ref 30.0–36.0)
MCV: 94 fL (ref 80.0–100.0)
Monocytes Absolute: 0.6 10*3/uL (ref 0.1–1.0)
Monocytes Relative: 4 %
Neutro Abs: 13.9 10*3/uL — ABNORMAL HIGH (ref 1.7–7.7)
Neutrophils Relative %: 89 %
Platelets: 297 10*3/uL (ref 150–400)
RBC: 4.5 MIL/uL (ref 3.87–5.11)
RDW: 14 % (ref 11.5–15.5)
WBC: 15.6 10*3/uL — ABNORMAL HIGH (ref 4.0–10.5)
nRBC: 0 % (ref 0.0–0.2)

## 2020-04-07 LAB — COMPREHENSIVE METABOLIC PANEL
ALT: 19 U/L (ref 0–44)
AST: 19 U/L (ref 15–41)
Albumin: 2 g/dL — ABNORMAL LOW (ref 3.5–5.0)
Alkaline Phosphatase: 53 U/L (ref 38–126)
Anion gap: 10 (ref 5–15)
BUN: 17 mg/dL (ref 8–23)
CO2: 34 mmol/L — ABNORMAL HIGH (ref 22–32)
Calcium: 8.9 mg/dL (ref 8.9–10.3)
Chloride: 104 mmol/L (ref 98–111)
Creatinine, Ser: 0.74 mg/dL (ref 0.44–1.00)
GFR calc Af Amer: >60 mL/min (ref >60)
GFR calc non Af Amer: >60 mL/min (ref >60)
Glucose, Bld: 181 mg/dL — ABNORMAL HIGH (ref 70–99)
Potassium: 4.7 mmol/L (ref 3.5–5.1)
Sodium: 148 mmol/L — ABNORMAL HIGH (ref 135–145)
Total Bilirubin: 0.7 mg/dL (ref 0.3–1.2)
Total Protein: 6.7 g/dL (ref 6.5–8.1)

## 2020-04-07 LAB — PROCALCITONIN: Procalcitonin: <0.1 ng/mL

## 2020-04-07 LAB — D-DIMER, QUANTITATIVE: D-Dimer, Quant: 3.5 ug/mL-FEU — ABNORMAL HIGH (ref 0.00–0.50)

## 2020-04-07 LAB — MAGNESIUM: Magnesium: 2.5 mg/dL — ABNORMAL HIGH (ref 1.7–2.4)

## 2020-04-07 LAB — BRAIN NATRIURETIC PEPTIDE: B Natriuretic Peptide: 45.4 pg/mL (ref 0.0–100.0)

## 2020-04-07 LAB — C-REACTIVE PROTEIN: CRP: 6.1 mg/dL — ABNORMAL HIGH (ref <1.0)

## 2020-04-07 MED ORDER — DEXTROSE 5 % IV SOLN: INTRAVENOUS | Status: AC

## 2020-04-07 NOTE — Progress Notes (Signed)
  Speech Language Pathology Treatment: Dysphagia  Patient Details Name: Christina Russell MRN: [id number] DOB: 10-28-1950 Today's Date: 04/07/2020 Time: 3846-6599 SLP Time Calculation (min) (ACUTE ONLY): 12 min  Assessment / Plan / Recommendation Clinical Impression  Pt O2 requirements have increased since therapist last worked with pt. Palliative care following and granddaughter coming to town and is open to comfort care should she continue to decline per MD note. She enjoyed a bite of her birthday cake- mild residue and pt requested a liquid wash. Consumed nectar thick water without overt difficulty. Plan was to attempt thin liquid but given her state today will continue nectar, puree. Will follow along and liquids may be liberated if pt or family desires if transits to comfort.    HPI HPI: 69 year old African-American female unvaccinated with history of dementia, dyslipidemia who presented to the hospital with shortness of breath.  She was diagnosed with acute hypoxic respiratory failure due to COVID-19 pneumonia and admitted to ICU on heated high flow. Per MD note has developed some CHF.        SLP Plan  Continue with current plan of care       Recommendations  Diet recommendations: Dysphagia 1 (puree);Nectar-thick liquid Liquids provided via: Cup;No straw Medication Administration: Crushed with puree Supervision: Staff to assist with self feeding;Full supervision/cueing for compensatory strategies Compensations: Slow rate;Minimize environmental distractions;Small sips/bites;Lingual sweep for clearance of pocketing Postural Changes and/or Swallow Maneuvers: Seated upright 90 degrees                Oral Care Recommendations: Oral care BID Follow up Recommendations: Other (comment);24 hour supervision/assistance SLP Visit Diagnosis: Dysphagia, unspecified (R13.10) Plan: Continue with current plan of care       GO                Royce Macadamia 04/07/1970, 3:08  PM

## 2020-04-07 NOTE — Progress Notes (Signed)
PROGRESS NOTE                                                                                                                                                                                                             Patient Demographics:    Christina Russell, is a 69 y.o. female, DOB - 01/17/1951, OBS:[id number]  Outpatient Primary MD for the patient is [name], MD    LOS - 8  Admit date - 03/30/2020    Chief Complaint  Patient presents with  . Covid/ Distress  . COVID positive       Brief Narrative 69 year old African-American female who is not vaccinated for Covid and has underlying history of dementia, dyslipidemia who presented to the hospital with shortness of breath.  She was diagnosed with acute hypoxic respiratory failure due to COVID-19 pneumonia and admitted to ICU on heated high flow.  She also developed some CHF.  She was stabilized and subsequently transferred to my service on 04/02/2020.   Subjective:   Patient in bed appears more lethargic and more confused today, unreliable historian but denies any chest pain or belly pain, only mumbling answers today.   Assessment  & Plan :     1. Acute Hypoxic Resp. Failure due to Acute Covid 19 Viral Pneumonitis during the ongoing 2020 Covid 19 Pandemic - she unfortunately is unvaccinated and has incurred severe parenchymal injury, has been placed on steroids, Remdesivir and Baricitinib, on HHFL 55 Lit, monitor closely, does have some leukocytosis which likely could be due to steroids however with ongoing encephalopathy chances of some aspiration as well, placed on Unasyn, MRSA screen -ve, speech eval, soft diet with elevated head of the bed for now.  Overall prognosis remains extremely guarded, clearly explained to the grand daughter, she is DNR if declines further comfort measures.  Palliative care also consulted as requested by the ICU team.  Encouraged the patient to  sit up in chair in the daytime use I-S and flutter valve for pulmonary toiletry and then prone in bed when at night.  Will advance activity and titrate down oxygen as possible.    SpO2: 90 % O2 Flow Rate (L/min): 30 L/min FiO2 (%): 80 %    Recent Labs  Lab 04/02/20 0326 04/02/20 0326 04/03/20 0452 04/03/20 0720 04/04/20 0727 04/05/20 4765 04/05/20 0745 04/06/20 0255 04/07/20  0323  WBC 11.3*  --  13.0*  --  12.9*  --   --  15.1* 15.6*  CRP 10.4*   < > 22.5*  --  22.1*  --  11.9* 7.8* 6.1*  DDIMER >20.00*  --  >20.00*  --  12.14*  --   --  8.05* 3.50*  BNP 85.4   < > 43.3  --  35.6 50.7  --  57.0 45.4  PROCALCITON  --   --   --  0.21 0.11 <0.10  --  <0.10 <0.10  AST 25  --  27  --  23  --   --  19 19  ALT 22  --  23  --  20  --   --  19 19  ALKPHOS 58  --  64  --  63  --   --  57 53  BILITOT 0.9  --  1.2  --  0.6  --   --  0.3 0.7  ALBUMIN 2.5*  --  2.7*  --  2.4*  --   --  2.3* 2.0*   < > = values in this interval not displayed.       2.  Extremely high D dimer - high risk for a clot, full dose Lovenox, -ve Leg [place], stable R Atrial pressure on TTE, too hypoxic for CTA.  3.  Underlying history of dementia.  At high risk for delirium, minimize narcotics and benzodiazepines, daughter has been counseled.  Currently does have mild toxic and metabolic encephalopathy 69 on top of her dementia.  4.  Dyslipidemia.  Continue home dose statin.  5.  Acute on chronic diastolic heart failure with EF 50 to 55%.   Adequately diuresed with IV Lasix, now slight hypernatremia suggesting intravascular depletion, gentle D5W on 04/07/2020, continue beta-blocker.  Outpatient cardiology follow-up for some distal septal hypokinesis, continue combination of aspirin, statin and beta-blocker for secondary prevention for now.     6. Hypernatremia - gentle D5W.    Condition - Extremely Guarded ++  Family Communication  :  Daughter Marcelino Duster - [phone] on 04/02/20, 04/03/20, 04/04/20 -no daughter has been  told from day 1 that her mother's prognosis is extremely grave but she fails to understand, she says that her mother should do just fine.  Updated granddaughter and Hartford Sink in detail [phone] on 04/04/2020, 04/05/20.  She understands the grave prognosis, agrees if any further decline or suffering comfort measures.  Updated again 04/06/2020, 04/07/20    Code Status : DNR - DW daughter & grand daughter  Consults  :  PCCM  Procedures  :    Leg [place] - No DVT  TTE - 1. Abnormal septal motion with distal hypokinesis . Left ventricular ejection fraction, by estimation, is 50 to 55%. The left ventricle has low normal function. Left ventricular endocardial border not optimally defined to evaluate regional wall motion. Left ventricular diastolic parameters are indeterminate.  2. The mitral valve is normal in structure. Mild mitral valve regurgitation.  3. The aortic valve is tricuspid. Aortic valve regurgitation is not visualized. Mild aortic valve sclerosis is present, with no evidence of aortic valve stenosis.  4. There is normal pulmonary artery systolic pressure.  5. The inferior vena cava is normal in size with greater than 50% respiratory variability, suggesting right atrial pressure of 3 mmHg.   PUD Prophylaxis :  PPI  Disposition Plan  :    Status is: Inpatient  Remains inpatient appropriate because:IV treatments appropriate due to intensity  of illness or inability to take PO   Dispo: The patient is from: Home              Anticipated d/c is to: Home              Anticipated d/c date is: > 3 days              Patient currently is not medically stable to d/c.  DVT Prophylaxis  :  Lovenox   Lab Results  Component Value Date   PLT 297 04/07/2020    Diet :  Diet Order            DIET - DYS 1 Room service appropriate? No; Fluid consistency: Nectar Thick  Diet effective now                  Inpatient Medications  Scheduled Meds: . albuterol  2 puff Inhalation TID  . vitamin C  500  mg Oral Daily  . atorvastatin  40 mg Oral Daily  . baricitinib  2 mg Oral Daily  . donepezil  10 mg Oral QHS  . enoxaparin (LOVENOX) injection  60 mg Subcutaneous Q12H  . mouth rinse  15 mL Mouth Rinse BID  . methylPREDNISolone (SOLU-MEDROL) injection  60 mg Intravenous BID  . metoprolol tartrate  12.5 mg Oral BID  . nystatin  5 mL Mouth/Throat TID AC & HS  . pantoprazole  40 mg Oral Daily  . zinc sulfate  220 mg Oral Daily   Continuous Infusions: . ampicillin-sulbactam (UNASYN) IV 3 g (04/07/20 0356)  . dextrose 100 mL/hr at 04/07/20 0800   PRN Meds:.acetaminophen, chlorpheniramine-HYDROcodone, docusate sodium, [DISCONTINUED] ondansetron **OR** ondansetron (ZOFRAN) IV, polyethylene glycol, Resource ThickenUp Clear  Antibiotics  :    Anti-infectives (From admission, onward)   Start     Dose/Rate Route Frequency Ordered Stop   04/03/20 1000  Ampicillin-Sulbactam (UNASYN) 3 g in sodium chloride 0.9 % 100 mL IVPB        3 g 200 mL/hr over 30 Minutes Intravenous Every 6 hours 04/03/20 0935     03/31/20 1000  remdesivir 100 mg in sodium chloride 0.9 % 100 mL IVPB       "Followed by" Linked Group Details   100 mg 200 mL/hr over 30 Minutes Intravenous Daily 03/30/20 1445 04/03/20 0917   03/30/20 1445  remdesivir 200 mg in sodium chloride 0.9% 250 mL IVPB       "Followed by" Linked Group Details   200 mg 580 mL/hr over 30 Minutes Intravenous Once 03/30/20 1445 03/30/20 1714       Time Spent in minutes  30   Susa Raring M.D on 04/07/2020 at 8:40 AM  To page go to [email or web address] - password [facility]  [facility] -  Office  [phone]    See all Orders from today for further details    Objective:   Vitals:   04/06/20 2000 04/07/20 0134 04/07/20 0400 04/07/20 0800  BP: 134/78 121/83 130/72   Pulse: 100 80 77   Resp: 17 14 16    Temp: 97.8 F (36.6 C)  98.3 F (36.8 C) (!) 97.5 F (36.4 C)  TempSrc: Axillary  Oral Axillary  SpO2: 98% 98% 90%   Weight:   59.8 kg     Height:        Wt Readings from Last 3 Encounters:  04/07/20 59.8 kg  01/30/20 65.3 kg  12/05/19 66 kg     Intake/Output Summary (Last 24 hours)  at 04/07/2020 0840 Last data filed at 04/07/2020 0526 Gross per 24 hour  Intake 580 ml  Output 500 ml  Net 80 ml     Physical Exam  Awake but more confused and slightly lethargic today, No new F.N deficits,   [place].AT,PERRAL Supple Neck,No JVD, No cervical lymphadenopathy appriciated.  Symmetrical Chest wall movement, Good air movement bilaterally, fine rales RRR,No Gallops, Rubs or new Murmurs, No Parasternal Heave +ve B.Sounds, Abd Soft, No tenderness, No organomegaly appriciated, No rebound - guarding or rigidity. No Cyanosis, Clubbing or edema, No new Rash or bruise    Data Review:    CBC Recent Labs  Lab 04/02/20 0326 04/03/20 0452 04/04/20 0727 04/06/20 0255 04/07/20 0323  WBC 11.3* 13.0* 12.9* 15.1* 15.6*  HGB 13.3 14.8 13.6 13.4 13.1  HCT 41.3 46.2* 43.2 42.8 42.3  PLT 205 233 261 311 297  MCV 91.2 90.9 92.5 94.1 94.0  MCH 29.4 29.1 29.1 29.5 29.1  MCHC 32.2 32.0 31.5 31.3 31.0  RDW 13.8 14.0 14.2 14.2 14.0  LYMPHSABS 1.5 1.1 0.9 0.5* 0.9  MONOABS 0.5 0.4 0.7 0.8 0.6  EOSABS 0.0 0.0 0.0 0.0 0.0  BASOSABS 0.0 0.0 0.0 0.0 0.0    Chemistries  Recent Labs  Lab 04/02/20 0326 04/02/20 0326 04/03/20 0452 04/04/20 0727 04/05/20 0745 04/06/20 0255 04/07/20 0323  NA 142   < > 140 140 147* 145 148*  K 3.1*   < > 4.2 4.0 4.0 3.7 4.7  CL 100   < > 96* 100 101 100 104  CO2 29   < > 28 28 31 29  34*  GLUCOSE 102*   < > 133* 172* 157* 356* 181*  BUN 15   < > 18 26* 30* 28* 17  CREATININE 0.63   < > 0.71 0.78 0.77 0.94 0.74  CALCIUM 8.1*   < > 8.6* 8.5* 8.9 9.0 8.9  AST 25  --  27 23  --  19 19  ALT 22  --  23 20  --  19 19  ALKPHOS 58  --  64 63  --  57 53  BILITOT 0.9  --  1.2 0.6  --  0.3 0.7  MG 2.0   < > 2.2 2.7* 2.6* 2.5* 2.5*   < > = values in this interval not displayed.      ------------------------------------------------------------------------------------------------------------------ No results for input(s): CHOL, HDL, LDLCALC, TRIG, CHOLHDL, LDLDIRECT in the last 72 hours.  Lab Results  Component Value Date   HGBA1C 5.3 04/15/2017   ------------------------------------------------------------------------------------------------------------------ No results for input(s): TSH, T4TOTAL, T3FREE, THYROIDAB in the last 72 hours.  Invalid input(s): FREET3  Cardiac Enzymes No results for input(s): CKMB, TROPONINI, MYOGLOBIN in the last 168 hours.  Invalid input(s): CK ------------------------------------------------------------------------------------------------------------------    Component Value Date/Time   BNP 45.4 04/07/2020 0323    Micro Results Recent Results (from the past 240 hour(s))  Blood Culture (routine x 2)     Status: None   Collection Time: 03/30/20 11:56 AM   Specimen: BLOOD RIGHT FOREARM  Result Value Ref Range Status   Specimen Description BLOOD RIGHT FOREARM  Final   Special Requests   Final    BOTTLES DRAWN AEROBIC AND ANAEROBIC Blood Culture results may not be optimal due to an excessive volume of blood received in culture bottles   Culture   Final    NO GROWTH 5 DAYS Performed at [facility], [address] [place], [place]    Report  Status 04/04/2020 FINAL  Final  SARS Coronavirus 2 by RT PCR (hospital order, performed in [facility] hospital lab) Nasopharyngeal Nasopharyngeal Swab     Status: Abnormal   Collection Time: 03/30/20 12:02 PM   Specimen: Nasopharyngeal Swab  Result Value Ref Range Status   SARS Coronavirus 2 POSITIVE (A) NEGATIVE Final    Comment: RESULT CALLED TO, READ BACK BY AND VERIFIED WITH: RN E DIXON [id number] MLM (NOTE) SARS-CoV-2 target nucleic acids are DETECTED  SARS-CoV-2 RNA is generally detectable in upper respiratory specimens  during the acute phase of infection.   Positive results are indicative  of the presence of the identified virus, but do not rule out bacterial infection or co-infection with other pathogens not detected by the test.  Clinical correlation with patient history and  other diagnostic information is necessary to determine patient infection status.  The expected result is negative.  Fact Sheet for Patients:   BoilerBrush.com.cy   Fact Sheet for Healthcare Providers:   https://pope.com/    This test is not yet approved or cleared by the [place] FDA and  has been authorized for detection and/or diagnosis of SARS-CoV-2 by FDA under an Emergency Use Authorization (EUA).  This EUA will remain in effect (meaning this test can b e used) for the duration of  the COVID-19 declaration under Section 564(b)(1) of the Act, 21 U.S.C. section 360-bbb-3(b)(1), unless the authorization is terminated or revoked sooner.  Performed at [facility], [address], [place], [place] [address]   Blood Culture (routine x 2)     Status: None   Collection Time: 03/30/20 12:27 PM   Specimen: BLOOD  Result Value Ref Range Status   Specimen Description BLOOD LEFT ANTECUBITAL  Final   Special Requests   Final    BOTTLES DRAWN AEROBIC ONLY Blood Culture results may not be optimal due to an inadequate volume of blood received in culture bottles   Culture   Final    NO GROWTH 5 DAYS Performed at [facility], [address] [place], [place] [address]    Report Status 04/04/2020 FINAL  Final  MRSA PCR Screening     Status: None   Collection Time: 03/31/20 12:01 AM   Specimen: Nasopharyngeal  Result Value Ref Range Status   MRSA by PCR NEGATIVE NEGATIVE Final    Comment:        The GeneXpert MRSA Assay (FDA approved for NASAL specimens only), is one component of a comprehensive MRSA colonization surveillance program. It is not intended to diagnose MRSA infection nor to guide  or monitor treatment for MRSA infections. Performed at [facility], [address], [place], [place] [address]     Radiology Reports DG Chest [place] 1 View  Result Date: 04/07/2020 CLINICAL DATA:  Shortness of breath.  Reported COVID-19 positive. EXAM: PORTABLE CHEST 1 VIEW COMPARISON:  April 03, 2020 FINDINGS: There is persistent airspace consolidation in the left base with small left pleural effusion. There is slightly less airspace consolidation in the right base compared to recent study. There is ill-defined opacity in the right mid lung, stable. More subtle ill-defined opacity is noted in the left mid lung. Heart size and pulmonary vascular normal. No adenopathy. There is aortic atherosclerosis. No bone lesions. IMPRESSION: Areas of multifocal pneumonia with consolidation greatest in the left base region. Small left pleural effusion. Suspect atypical organism pneumonia as most likely etiology. Superimposed bacterial pneumonia in the left base is possible. Stable cardiac  silhouette. Aortic Atherosclerosis (ICD10-I70.0). Electronically Signed   By: Bretta Bang III M.D.   On: 04/07/2020 08:08   DG Chest Port 1 View  Result Date: 04/03/2020 CLINICAL DATA:  History of COVID infection EXAM: PORTABLE CHEST 1 VIEW COMPARISON:  April 02, 2020 FINDINGS: Images rotated to the LEFT. Accounting for this rotation cardiomediastinal contours are stable. Increasing opacity at the LEFT lung base compared to the prior study. Also with increased opacity at the RIGHT lung base. Air bronchograms are seen at the LEFT lung base on today's study as well. Exam is more similar to the study of August 15. Probable small LEFT effusion as before. On limited assessment skeletal structures are unremarkable. IMPRESSION: Bilateral opacities worse at the lung bases and worse in the LEFT lung base, similar to prior imaging in this patient with reported history of COVID-19 pneumonia. Electronically Signed   By: Donzetta Kohut M.D.   On: 04/03/2020 07:54   DG Chest Port 1 View  Result Date: 04/02/2020 CLINICAL DATA:  Acute respiratory failure with hypoxemia, COVID-19 EXAM: PORTABLE CHEST 1 VIEW COMPARISON:  Portable exam 0652 hours compared 03/30/2020 FINDINGS: Normal heart size, mediastinal contours, and pulmonary vascularity. Atherosclerotic calcification aorta. Scattered pulmonary infiltrates bilaterally consistent with multifocal pneumonia most confluent in the LEFT lower lobe. No pleural effusion or pneumothorax. Mild osseous demineralization. IMPRESSION: Persistent pulmonary infiltrates consistent with multifocal pneumonia and history of COVID-19, slightly improved. Electronically Signed   By: Ulyses Southward M.D.   On: 04/02/2020 08:34   DG Chest Port 1 View  Result Date: 03/30/2020 CLINICAL DATA:  Patient with history of COVID-19. Worsening shortness of breath. EXAM: PORTABLE CHEST 1 VIEW COMPARISON:  None. FINDINGS: Monitoring leads overlie the patient. Enlarged cardiac and mediastinal contours. Diffuse bilateral airspace opacities. Probable small left pleural effusion. No pneumothorax. IMPRESSION: 1. Cardiomegaly. 2. Diffuse bilateral airspace opacities most compatible with multifocal pneumonia. Electronically Signed   By: Annia Belt M.D.   On: 03/30/2020 12:51   VAS [place] LOWER EXTREMITY VENOUS (DVT)  Result Date: 04/02/2020  Lower Venous DVTStudy Other Indications: Covid positive with elevated d-dimer. Performing Technologist: Marilynne Halsted RDMS, RVT  Examination Guidelines: A complete evaluation includes B-mode imaging, spectral Doppler, color Doppler, and power Doppler as needed of all accessible portions of each vessel. Bilateral testing is considered an integral part of a complete examination. Limited examinations for reoccurring indications may be performed as noted. The reflux portion of the exam is performed with the patient in reverse Trendelenburg.   +---------+---------------+---------+-----------+----------+--------------+ RIGHT    CompressibilityPhasicitySpontaneityPropertiesThrombus Aging +---------+---------------+---------+-----------+----------+--------------+ CFV      Full           Yes      Yes                                 +---------+---------------+---------+-----------+----------+--------------+ SFJ      Full                                                        +---------+---------------+---------+-----------+----------+--------------+ FV Prox  Full                                                        +---------+---------------+---------+-----------+----------+--------------+  FV Mid   Full                                                        +---------+---------------+---------+-----------+----------+--------------+ FV DistalFull                                                        +---------+---------------+---------+-----------+----------+--------------+ PFV      Full                                                        +---------+---------------+---------+-----------+----------+--------------+ POP      Full           Yes      Yes                                 +---------+---------------+---------+-----------+----------+--------------+ PTV      Full                                                        +---------+---------------+---------+-----------+----------+--------------+ PERO     Full                                                        +---------+---------------+---------+-----------+----------+--------------+   +---------+---------------+---------+-----------+----------+--------------+ LEFT     CompressibilityPhasicitySpontaneityPropertiesThrombus Aging +---------+---------------+---------+-----------+----------+--------------+ CFV      Full           Yes      Yes                                  +---------+---------------+---------+-----------+----------+--------------+ SFJ      Full                                                        +---------+---------------+---------+-----------+----------+--------------+ FV Prox  Full                                                        +---------+---------------+---------+-----------+----------+--------------+ FV Mid   Full                                                        +---------+---------------+---------+-----------+----------+--------------+  FV DistalFull                                                        +---------+---------------+---------+-----------+----------+--------------+ PFV      Full                                                        +---------+---------------+---------+-----------+----------+--------------+ POP      Full           Yes      Yes                                 +---------+---------------+---------+-----------+----------+--------------+ PTV      Full                                                        +---------+---------------+---------+-----------+----------+--------------+ PERO     Full                                                        +---------+---------------+---------+-----------+----------+--------------+     Summary: BILATERAL: - No evidence of deep vein thrombosis seen in the lower extremities, bilaterally. -No evidence of popliteal cyst, bilaterally.   *See table(s) above for measurements and observations. Electronically signed by Gretta Began MD on 04/02/2020 at 5:04:02 PM.    Final    ECHOCARDIOGRAM LIMITED  Result Date: 03/31/2020    ECHOCARDIOGRAM LIMITED REPORT   Patient Name:   REJEANA RAINBOLT Date of Exam: 03/31/2020 Medical Rec #:  [id number]     Height:       62.0 in Accession #:    [id number]    Weight:       142.2 lb Date of Birth:  12-01-1950     BSA:          1.654 m Patient Age:    68 years      BP:           136/80 mmHg Patient Gender:  F             HR:           86 bpm. Exam Location:  Inpatient Procedure: Limited Echo, Color Doppler and Cardiac Doppler Indications:    R06.9 DOE  History:        Patient has no prior history of Echocardiogram examinations.                 Risk Factors:Dyslipidemia.  Sonographer:    Irving Burton Senior RDCS Referring Phys: Humberto Seals  Sonographer Comments: COVID+ at time of study IMPRESSIONS  1. Abnormal septal motion with distal hypokinesis . Left ventricular ejection fraction, by estimation, is 50 to 55%. The left ventricle has low normal function. Left ventricular endocardial border not optimally defined to  evaluate regional wall motion. Left ventricular diastolic parameters are indeterminate.  2. The mitral valve is normal in structure. Mild mitral valve regurgitation.  3. The aortic valve is tricuspid. Aortic valve regurgitation is not visualized. Mild aortic valve sclerosis is present, with no evidence of aortic valve stenosis.  4. There is normal pulmonary artery systolic pressure.  5. The inferior vena cava is normal in size with greater than 50% respiratory variability, suggesting right atrial pressure of 3 mmHg. FINDINGS  Left Ventricle: Abnormal septal motion with distal hypokinesis. Left ventricular ejection fraction, by estimation, is 50 to 55%. The left ventricle has low normal function. Left ventricular endocardial border not optimally defined to evaluate regional wall motion. The left ventricular internal cavity size was normal in size. Right Ventricle: There is normal pulmonary artery systolic pressure. The tricuspid regurgitant velocity is 2.33 m/s, and with an assumed right atrial pressure of 3 mmHg, the estimated right ventricular systolic pressure is 24.7 mmHg. Mitral Valve: The mitral valve is normal in structure. There is mild thickening of the mitral valve leaflet(s). There is mild calcification of the mitral valve leaflet(s). Mild mitral annular calcification. Mild mitral valve  regurgitation. Tricuspid Valve: The tricuspid valve is normal in structure. Tricuspid valve regurgitation is mild. Aortic Valve: The aortic valve is tricuspid. Aortic valve regurgitation is not visualized. Mild aortic valve sclerosis is present, with no evidence of aortic valve stenosis. Venous: The inferior vena cava is normal in size with greater than 50% respiratory variability, suggesting right atrial pressure of 3 mmHg. IAS/Shunts: The interatrial septum was not well visualized. RIGHT VENTRICLE RV S prime:     10.80 cm/s TAPSE (M-mode): 1.7 cm AORTIC VALVE LVOT Vmax:   95.30 cm/s LVOT Vmean:  65.900 cm/s LVOT VTI:    0.180 m MITRAL VALVE               TRICUSPID VALVE MV Area (PHT): 4.21 cm    TR Peak grad:   21.7 mmHg MV Decel Time: 180 msec    TR Vmax:        233.00 cm/s MV E velocity: 57.70 cm/s MV A velocity: 73.20 cm/s  SHUNTS MV E/A ratio:  0.79        Systemic VTI: 0.18 m Charlton Haws MD Electronically signed by Charlton Haws MD Signature Date/Time: 03/31/2020/3:13:28 PM    Final

## 2020-04-07 NOTE — Progress Notes (Signed)
Daily Progress Note   Patient Name: Christina Russell       Date: 04/07/2020 DOB: January 24, 1951  Age: 69 y.o. MRN#: [id number] Attending Physician: Leroy Sea, MD Primary Care Physician: [name], MD Admit Date: 03/30/2020  Reason for Consultation/Follow-up: Establishing goals of care  Subjective: Per chart review, patient more lethargic and confused today. She did eat 100% of breakfast.   GOC:  F/u with granddaughter, Rodricka via telephone. The earliest she will arrive in [place] is Wednesday. She has been in contact with multiple family members and preparing them for poor prognosis. Ideally, Rodricka would still like to try and get her grandmother home with hospice so other family members could pay their respects before she passes. Explained that we will continue to take this one day at a time, but concern that it will be challenging to get her out of the hospital with high oxygen requirements.   Discussed plan of care, gentle hydration. Rodricka did speak with Dr. Thedore Mins this morning and again reiterates her understanding that if her grandmother's condition declines and/or she appears to be suffering, recommendation is transition to comfort sooner.   As per request of granddaughter, this NP wrote a letter explaining patient's critical condition for incarcerated son. Unfortunately they will not approve him to visit with her but plan is for a video chat to be arranged. Rodricka will address this again when she arrives to [place].   Answered questions. Rodricka has PMT contact information and understands she can call with questions or concerns.   Length of Stay: 8  Current Medications: Scheduled Meds:  . albuterol  2 puff Inhalation TID  . vitamin C  500 mg Oral Daily  .  atorvastatin  40 mg Oral Daily  . baricitinib  2 mg Oral Daily  . enoxaparin (LOVENOX) injection  60 mg Subcutaneous Q12H  . mouth rinse  15 mL Mouth Rinse BID  . methylPREDNISolone (SOLU-MEDROL) injection  60 mg Intravenous BID  . metoprolol tartrate  12.5 mg Oral BID  . nystatin  5 mL Mouth/Throat TID AC & HS  . pantoprazole  40 mg Oral Daily  . zinc sulfate  220 mg Oral Daily    Continuous Infusions: . ampicillin-sulbactam (UNASYN) IV 3 g (04/07/20 0935)  . dextrose 100 mL/hr at 04/07/20 1300    PRN Meds:  acetaminophen, chlorpheniramine-HYDROcodone, docusate sodium, [DISCONTINUED] ondansetron **OR** ondansetron (ZOFRAN) IV, polyethylene glycol, Resource ThickenUp Clear  Physical Exam        Vital Signs: BP 127/75 (BP Location: Right Arm)   Pulse 86   Temp 98.2 F (36.8 C) (Axillary)   Resp 20   Ht 5\' 2"  (1.575 m)   Wt 59.8 kg   SpO2 (!) 89%   BMI 24.11 kg/m  SpO2: SpO2: (!) 89 % O2 Device: O2 Device: High Flow Nasal Cannula O2 Flow Rate: O2 Flow Rate (L/min): 30 L/min  Intake/output summary:   Intake/Output Summary (Last 24 hours) at 04/07/2020 1424 Last data filed at 04/07/2020 1100 Gross per 24 hour  Intake 480 ml  Output 500 ml  Net -20 ml   LBM: Last BM Date: 04/06/20 Baseline Weight: Weight: 65.3 kg Most recent weight: Weight: 59.8 kg       Palliative Assessment/Data: PPS 30%    Flowsheet Rows     Most Recent Value  Intake Tab  Referral Department Critical care  Unit at Time of Referral ICU  Palliative Care Primary Diagnosis Sepsis/Infectious Disease  Date Notified 04/01/20  Palliative Care Type New Palliative care  Reason for referral Clarify Goals of Care  Date of Admission 03/30/20  Date first seen by Palliative Care 04/06/20  # of days Palliative referral response time 5 Day(s)  # of days IP prior to Palliative referral 2  Clinical Assessment  Psychosocial & Spiritual Assessment  Palliative Care Outcomes      Patient Active Problem  List   Diagnosis Date Noted  . Acute respiratory failure with hypoxia (HCC)   . Palliative care by specialist   . Acute respiratory failure due to COVID-19 (HCC) 03/30/2020  . Pneumonia due to COVID-19 virus 03/30/2020  . COVID-19 03/30/2020  . Dementia (HCC)   . Hyperlipidemia 12/05/2019  . Estrogen deficiency 10/20/2018  . Goals of care, counseling/discussion 10/20/2018  . Urinary frequency 11/30/2017  . Trigger middle finger of left hand 11/30/2017  . Seizures (HCC) 11/29/2017    Palliative Care Assessment & Plan   Patient Profile: 69 y.o. female  with past medical history of dementia and hyperlipidemia admitted on 03/30/2020 with shortness of breath. Hospital admission for acute hypoxic respiratory failure secondary to COVID-19 pneumonia and developed CHF. Not clinically progressing despite aggressive medical management. Remains on high flow 55L. Patient is a DNR/DNI. Palliative medicine consultation for goals of are.   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 Continue current plan of care and medical management.  Patient's daughter 04/01/2020) is unfortunately hospitalized with covid also. Patient's granddaughter (Rodricka) is primary contact at this time. The earliest Rodricka will be in [place] is Wednesday, 8/25. Rodricka is preparing family of patient's condition and prognosis. Ideally, Rodricka would like to discharge her home with hospice so family has the opportunity to say goodbyes but I did explain this may be challenging with high oxygen requirements.   Ongoing palliative discussions pending clinical course and when granddaughter arrives. If further decline, recommendation will be shift to comfort sooner.   Chaplain support requested to help arrange video chat with incarcerated son when granddaughter is ready. Discussed with  9/25, PMT chaplain.     Code Status: DNR   Code Status Orders  (From admission, onward)         Start     Ordered   04/03/20 0925  Do not attempt  resuscitation (DNR)  Continuous       Question Answer Comment  In the event of cardiac or respiratory ARREST Do not call a "code blue"   In the event of cardiac or respiratory ARREST Do not perform Intubation, CPR, defibrillation or ACLS   In the event of cardiac or respiratory ARREST Use medication by any route, position, wound care, and other measures to relive pain and suffering. May use oxygen, suction and manual treatment of airway obstruction as needed for comfort.      04/03/20 0924        Code Status History    Date Active Date Inactive Code Status Order ID Comments User Context   03/30/2020 2248 04/03/2020 0924 Full Code [id number]  Gleason, Markus Jarvis ED   03/30/2020 1445 03/30/2020 2248 Full Code [id number]  Lucile Shutters, MD ED   Advance Care Planning Activity       Prognosis:   Poor prognosis  Discharge Planning:  To Be Determined  Care plan was discussed with multidisciplinary team rounds, granddaughter  Thank you for allowing the Palliative Medicine Team to assist in the care of this patient.   Total Time 20 Prolonged Time Billed no      Greater than 50%  of this time was spent counseling and coordinating care related to the above assessment and plan.  The above conversation was completed via telephone due to visitor restrictions during COVID-19 pandemic. Thorough chart review and discussion with multidisciplinary team was completed as part of assessment. No physical examination was performed.   Vennie Homans, DNP, FNP-C Palliative Medicine Team  Phone: [phone] Fax: [phone]  Please contact Palliative Medicine Team phone at [phone] for questions and concerns.

## 2020-04-08 LAB — CBC WITH DIFFERENTIAL/PLATELET
Abs Immature Granulocytes: 0.28 10*3/uL — ABNORMAL HIGH (ref 0.00–0.07)
Basophils Absolute: 0 10*3/uL (ref 0.0–0.1)
Basophils Relative: 0 %
Eosinophils Absolute: 0 10*3/uL (ref 0.0–0.5)
Eosinophils Relative: 0 %
HCT: 42.1 % (ref 36.0–46.0)
Hemoglobin: 13.2 g/dL (ref 12.0–15.0)
Immature Granulocytes: 2 %
Lymphocytes Relative: 7 %
Lymphs Abs: 1.3 10*3/uL (ref 0.7–4.0)
MCH: 29.6 pg (ref 26.0–34.0)
MCHC: 31.4 g/dL (ref 30.0–36.0)
MCV: 94.4 fL (ref 80.0–100.0)
Monocytes Absolute: 0.7 10*3/uL (ref 0.1–1.0)
Monocytes Relative: 4 %
Neutro Abs: 15.3 10*3/uL — ABNORMAL HIGH (ref 1.7–7.7)
Neutrophils Relative %: 87 %
Platelets: 312 10*3/uL (ref 150–400)
RBC: 4.46 MIL/uL (ref 3.87–5.11)
RDW: 13.8 % (ref 11.5–15.5)
WBC: 17.5 10*3/uL — ABNORMAL HIGH (ref 4.0–10.5)
nRBC: 0 % (ref 0.0–0.2)

## 2020-04-08 LAB — COMPREHENSIVE METABOLIC PANEL
ALT: 21 U/L (ref 0–44)
AST: 20 U/L (ref 15–41)
Albumin: 2.1 g/dL — ABNORMAL LOW (ref 3.5–5.0)
Alkaline Phosphatase: 52 U/L (ref 38–126)
Anion gap: 12 (ref 5–15)
BUN: 19 mg/dL (ref 8–23)
CO2: 28 mmol/L (ref 22–32)
Calcium: 8.7 mg/dL — ABNORMAL LOW (ref 8.9–10.3)
Chloride: 103 mmol/L (ref 98–111)
Creatinine, Ser: 0.75 mg/dL (ref 0.44–1.00)
GFR calc Af Amer: >60 mL/min (ref >60)
GFR calc non Af Amer: >60 mL/min (ref >60)
Glucose, Bld: 174 mg/dL — ABNORMAL HIGH (ref 70–99)
Potassium: 4.7 mmol/L (ref 3.5–5.1)
Sodium: 143 mmol/L (ref 135–145)
Total Bilirubin: 0.8 mg/dL (ref 0.3–1.2)
Total Protein: 6.5 g/dL (ref 6.5–8.1)

## 2020-04-08 LAB — D-DIMER, QUANTITATIVE: D-Dimer, Quant: 3.66 ug/mL-FEU — ABNORMAL HIGH (ref 0.00–0.50)

## 2020-04-08 LAB — PROCALCITONIN: Procalcitonin: <0.1 ng/mL

## 2020-04-08 LAB — BRAIN NATRIURETIC PEPTIDE: B Natriuretic Peptide: 42.7 pg/mL (ref 0.0–100.0)

## 2020-04-08 LAB — C-REACTIVE PROTEIN: CRP: 3.3 mg/dL — ABNORMAL HIGH (ref <1.0)

## 2020-04-08 LAB — MAGNESIUM: Magnesium: 2.4 mg/dL (ref 1.7–2.4)

## 2020-04-08 MED ORDER — SODIUM CHLORIDE 0.9 % IV SOLN
INTRAVENOUS | Status: DC | PRN
  Administered 2020-04-08 – 2020-04-09 (×2): 250 mL via INTRAVENOUS

## 2020-04-08 NOTE — Progress Notes (Signed)
PROGRESS NOTE                                                                                                                                                                                                             Patient Demographics:    Christina Russell, is a 69 y.o. female, DOB - 06-17-1951, NWG:[id number]  Outpatient Primary MD for the patient is [name], MD    LOS - 9  Admit date - 03/30/2020    Chief Complaint  Patient presents with  . Covid/ Distress  . COVID positive       Brief Narrative 69 year old African-American female who is not vaccinated for Covid and has underlying history of dementia, dyslipidemia who presented to the hospital with shortness of breath.  She was diagnosed with acute hypoxic respiratory failure due to COVID-19 pneumonia and admitted to ICU on heated high flow.  She also developed some CHF.  She was stabilized and subsequently transferred to my service on 04/02/2020.   Subjective:   Patient remains in bed no distress but appears lethargic, slightly more alert than yesterday, denies any chest or abdominal pain, unreliable historian but denies any shortness of breath.   Assessment  & Plan :     1. Acute Hypoxic Resp. Failure due to Acute Covid 19 Viral Pneumonitis during the ongoing 2020 Covid 19 Pandemic - she unfortunately is unvaccinated and has incurred severe parenchymal injury, has been placed on steroids, Remdesivir and Baricitinib, on HHFL 55 Lit, monitor closely.   Possibly developed aspiration pneumonia as well and has been placed on Unasyn, MRSA screen -ve, speech eval, and currently on dysphagia 1 diet with nectar thick liquids with elevated head of the bed for now.  Overall prognosis remains extremely guarded, clearly explained to the grand daughter, she is DNR if declines further comfort measures.  Palliative care also consulted and following.  Encouraged the patient to sit  up in chair in the daytime use I-S and flutter valve for pulmonary toiletry and then prone in bed when at night.  Will advance activity and titrate down oxygen as possible.    SpO2: 90 % O2 Flow Rate (L/min): 30 L/min FiO2 (%): 80 %    Recent Labs  Lab 04/03/20 0452 04/03/20 0452 04/03/20 0720 04/04/20 5784 04/05/20 6962 04/05/20 0745 04/06/20 0255 04/07/20 0323 04/08/20 9528  WBC 13.0*  --   --  12.9*  --   --  15.1* 15.6* 17.5*  CRP 22.5*   < >  --  22.1*  --  11.9* 7.8* 6.1* 3.3*  DDIMER >20.00*  --   --  12.14*  --   --  8.05* 3.50* 3.66*  BNP 43.3   < >  --  35.6 50.7  --  57.0 45.4 42.7  PROCALCITON  --   --  0.21 0.11 <0.10  --  <0.10 <0.10  --   AST 27  --   --  23  --   --  19 19 20   ALT 23  --   --  20  --   --  19 19 21   ALKPHOS 64  --   --  63  --   --  57 53 52  BILITOT 1.2  --   --  0.6  --   --  0.3 0.7 0.8  ALBUMIN 2.7*  --   --  2.4*  --   --  2.3* 2.0* 2.1*   < > = values in this interval not displayed.       2.  Extremely high D dimer - high risk for a clot, full dose Lovenox until D-dimer dips below 2, -ve Leg , stable R Atrial pressure on TTE, too hypoxic for CTA.  3.  Underlying history of dementia.  At high risk for delirium, minimize narcotics and benzodiazepines, daughter has been counseled.  Currently does have mild toxic and metabolic encephalopathy on top of her dementia.  4.  Dyslipidemia.  Continue home dose statin.  5.  Acute on chronic diastolic heart failure with EF 50 to 55%.   Adequately diuresed with IV Lasix, now slight hypernatremia suggesting intravascular depletion, gentle D5W on 04/07/2020, continue beta-blocker.  Outpatient cardiology follow-up for some distal septal hypokinesis, continue combination of aspirin, statin and beta-blocker for secondary prevention for now.     6. Hypernatremia - gentle D5W.  7.  Possible aspiration pneumonia.  Speech following, on dysphagia 1 diet with nectar thick liquids, feeding assistance  aspiration precautions, Unasyn.  MRSA nasal swab negative.     Condition - Extremely Guarded ++  Family Communication  :  Daughter [place] 04/09/2020 [phone] on 04/02/20, 04/03/20, 04/04/20 -no daughter has been told from day 1 that her mother's prognosis is extremely grave but she fails to understand, she says that her mother should do just fine.  Updated daughter in the hospital on 04/08/2020 she is admitted here as well in room 5W21.  Updated granddaughter and 04/10/2020 in detail [phone] on 04/04/2020, 04/05/20.  She understands the grave prognosis, agrees if any further decline or suffering comfort measures.  Updated again 04/06/2020, 04/07/20, on 04/08/20 at 8:44 AM message left.    Code Status : DNR - DW daughter & grand daughter  Consults  :  PCCM  Procedures  :    Leg 04/09/20 - No DVT  TTE - 1. Abnormal septal motion with distal hypokinesis . Left ventricular ejection fraction, by estimation, is 50 to 55%. The left ventricle has low normal function. Left ventricular endocardial border not optimally defined to evaluate regional wall motion. Left ventricular diastolic parameters are indeterminate.  2. The mitral valve is normal in structure. Mild mitral valve regurgitation.  3. The aortic valve is tricuspid. Aortic valve regurgitation is not visualized. Mild aortic valve sclerosis is present, with no evidence of aortic valve stenosis.  4. There is normal  pulmonary artery systolic pressure.  5. The inferior vena cava is normal in size with greater than 50% respiratory variability, suggesting right atrial pressure of 3 mmHg.   PUD Prophylaxis :  PPI  Disposition Plan  :    Status is: Inpatient  Remains inpatient appropriate because:IV treatments appropriate due to intensity of illness or inability to take PO   Dispo: The patient is from: Home              Anticipated d/c is to: Home              Anticipated d/c date is: > 3 days              Patient currently is not medically stable to  d/c.  DVT Prophylaxis  :  Lovenox   Lab Results  Component Value Date   PLT 312 04/08/2020    Diet :  Diet Order            DIET - DYS 1 Room service appropriate? No; Fluid consistency: Nectar Thick  Diet effective now                  Inpatient Medications  Scheduled Meds: . albuterol  2 puff Inhalation TID  . vitamin C  500 mg Oral Daily  . atorvastatin  40 mg Oral Daily  . baricitinib  2 mg Oral Daily  . enoxaparin (LOVENOX) injection  60 mg Subcutaneous Q12H  . mouth rinse  15 mL Mouth Rinse BID  . methylPREDNISolone (SOLU-MEDROL) injection  60 mg Intravenous BID  . metoprolol tartrate  12.5 mg Oral BID  . nystatin  5 mL Mouth/Throat TID AC & HS  . pantoprazole  40 mg Oral Daily  . zinc sulfate  220 mg Oral Daily   Continuous Infusions: . ampicillin-sulbactam (UNASYN) IV Stopped (04/08/20 0534)   PRN Meds:.acetaminophen, chlorpheniramine-HYDROcodone, docusate sodium, [DISCONTINUED] ondansetron **OR** ondansetron (ZOFRAN) IV, polyethylene glycol, Resource ThickenUp Clear  Antibiotics  :    Anti-infectives (From admission, onward)   Start     Dose/Rate Route Frequency Ordered Stop   04/03/20 1000  Ampicillin-Sulbactam (UNASYN) 3 g in sodium chloride 0.9 % 100 mL IVPB        3 g 200 mL/hr over 30 Minutes Intravenous Every 6 hours 04/03/20 0935     03/31/20 1000  remdesivir 100 mg in sodium chloride 0.9 % 100 mL IVPB       "Followed by" Linked Group Details   100 mg 200 mL/hr over 30 Minutes Intravenous Daily 03/30/20 1445 04/03/20 0917   03/30/20 1445  remdesivir 200 mg in sodium chloride 0.9% 250 mL IVPB       "Followed by" Linked Group Details   200 mg 580 mL/hr over 30 Minutes Intravenous Once 03/30/20 1445 03/30/20 1714       Time Spent in minutes  30   Susa Raring M.D on 04/08/2020 at 8:41 AM  To page go to [email or web address] - password [facility]  [facility] -  Office  [phone]    See all Orders from today for further details     Objective:   Vitals:   04/08/20 0000 04/08/20 0400 04/08/20 0812 04/08/20 0837  BP: 132/71 129/85 (!) 142/87   Pulse: 63 63 74   Resp: 13 20 (!) 21   Temp: 97.8 F (36.6 C) 97.9 F (36.6 C) 98 F (36.7 C)   TempSrc: Oral Oral Oral   SpO2: 96% 91% 92% 90%  Weight:  60 kg  Height:        Wt Readings from Last 3 Encounters:  04/08/20 60 kg  01/30/20 65.3 kg  12/05/19 66 kg     Intake/Output Summary (Last 24 hours) at 04/08/2020 0841 Last data filed at 04/08/2020 0648 Gross per 24 hour  Intake 1836.82 ml  Output 750 ml  Net 1086.82 ml     Physical Exam  Awake but more confused and slightly lethargic today, No new F.N deficits,   [place].AT,PERRAL Supple Neck,No JVD, No cervical lymphadenopathy appriciated.  Symmetrical Chest wall movement, Good air movement bilaterally, few rales RRR,No Gallops, Rubs or new Murmurs, No Parasternal Heave +ve B.Sounds, Abd Soft, No tenderness, No organomegaly appriciated, No rebound - guarding or rigidity. No Cyanosis, Clubbing or edema, No new Rash or bruise     Data Review:    CBC Recent Labs  Lab 04/03/20 0452 04/04/20 0727 04/06/20 0255 04/07/20 0323 04/08/20 0557  WBC 13.0* 12.9* 15.1* 15.6* 17.5*  HGB 14.8 13.6 13.4 13.1 13.2  HCT 46.2* 43.2 42.8 42.3 42.1  PLT 233 261 311 297 312  MCV 90.9 92.5 94.1 94.0 94.4  MCH 29.1 29.1 29.5 29.1 29.6  MCHC 32.0 31.5 31.3 31.0 31.4  RDW 14.0 14.2 14.2 14.0 13.8  LYMPHSABS 1.1 0.9 0.5* 0.9 1.3  MONOABS 0.4 0.7 0.8 0.6 0.7  EOSABS 0.0 0.0 0.0 0.0 0.0  BASOSABS 0.0 0.0 0.0 0.0 0.0    Chemistries  Recent Labs  Lab 04/03/20 0452 04/03/20 0452 04/04/20 0727 04/05/20 0745 04/06/20 0255 04/07/20 0323 04/08/20 0557  NA 140   < > 140 147* 145 148* 143  K 4.2   < > 4.0 4.0 3.7 4.7 4.7  CL 96*   < > 100 101 100 104 103  CO2 28   < > 28 31 29  34* 28  GLUCOSE 133*   < > 172* 157* 356* 181* 174*  BUN 18   < > 26* 30* 28* 17 19  CREATININE 0.71   < > 0.78 0.77 0.94 0.74 0.75   CALCIUM 8.6*   < > 8.5* 8.9 9.0 8.9 8.7*  AST 27  --  23  --  19 19 20   ALT 23  --  20  --  19 19 21   ALKPHOS 64  --  63  --  57 53 52  BILITOT 1.2  --  0.6  --  0.3 0.7 0.8  MG 2.2   < > 2.7* 2.6* 2.5* 2.5* 2.4   < > = values in this interval not displayed.     ------------------------------------------------------------------------------------------------------------------ No results for input(s): CHOL, HDL, LDLCALC, TRIG, CHOLHDL, LDLDIRECT in the last 72 hours.  Lab Results  Component Value Date   HGBA1C 5.3 04/15/2017   ------------------------------------------------------------------------------------------------------------------ No results for input(s): TSH, T4TOTAL, T3FREE, THYROIDAB in the last 72 hours.  Invalid input(s): FREET3  Cardiac Enzymes No results for input(s): CKMB, TROPONINI, MYOGLOBIN in the last 168 hours.  Invalid input(s): CK ------------------------------------------------------------------------------------------------------------------    Component Value Date/Time   BNP 42.7 04/08/2020 0557    Micro Results Recent Results (from the past 240 hour(s))  Blood Culture (routine x 2)     Status: None   Collection Time: 03/30/20 11:56 AM   Specimen: BLOOD RIGHT FOREARM  Result Value Ref Range Status   Specimen Description BLOOD RIGHT FOREARM  Final   Special Requests   Final    BOTTLES DRAWN AEROBIC AND ANAEROBIC Blood Culture results may not be optimal due to an excessive volume  of blood received in culture bottles   Culture   Final    NO GROWTH 5 DAYS Performed at [facility], [address] [place], [place] [address]    Report Status 04/04/2020 FINAL  Final  SARS Coronavirus 2 by RT PCR (hospital order, performed in [facility] hospital lab) Nasopharyngeal Nasopharyngeal Swab     Status: Abnormal   Collection Time: 03/30/20 12:02 PM   Specimen: Nasopharyngeal Swab  Result Value Ref Range Status   SARS Coronavirus 2 POSITIVE (A)  NEGATIVE Final    Comment: RESULT CALLED TO, READ BACK BY AND VERIFIED WITH: RN E DIXON [id number] MLM (NOTE) SARS-CoV-2 target nucleic acids are DETECTED  SARS-CoV-2 RNA is generally detectable in upper respiratory specimens  during the acute phase of infection.  Positive results are indicative  of the presence of the identified virus, but do not rule out bacterial infection or co-infection with other pathogens not detected by the test.  Clinical correlation with patient history and  other diagnostic information is necessary to determine patient infection status.  The expected result is negative.  Fact Sheet for Patients:   BoilerBrush.com.cy   Fact Sheet for Healthcare Providers:   https://pope.com/    This test is not yet approved or cleared by the [place] FDA and  has been authorized for detection and/or diagnosis of SARS-CoV-2 by FDA under an Emergency Use Authorization (EUA).  This EUA will remain in effect (meaning this test can b e used) for the duration of  the COVID-19 declaration under Section 564(b)(1) of the Act, 21 U.S.C. section 360-bbb-3(b)(1), unless the authorization is terminated or revoked sooner.  Performed at [facility], [address], [place], [place] [address]   Blood Culture (routine x 2)     Status: None   Collection Time: 03/30/20 12:27 PM   Specimen: BLOOD  Result Value Ref Range Status   Specimen Description BLOOD LEFT ANTECUBITAL  Final   Special Requests   Final    BOTTLES DRAWN AEROBIC ONLY Blood Culture results may not be optimal due to an inadequate volume of blood received in culture bottles   Culture   Final    NO GROWTH 5 DAYS Performed at [facility], [address] [place], [place] [address]    Report Status 04/04/2020 FINAL  Final  MRSA PCR Screening     Status: None   Collection Time: 03/31/20 12:01 AM   Specimen: Nasopharyngeal  Result Value Ref Range Status    MRSA by PCR NEGATIVE NEGATIVE Final    Comment:        The GeneXpert MRSA Assay (FDA approved for NASAL specimens only), is one component of a comprehensive MRSA colonization surveillance program. It is not intended to diagnose MRSA infection nor to guide or monitor treatment for MRSA infections. Performed at [facility], [address] [place], [place] [address]     Radiology Reports DG Chest Roberta 1 View  Result Date: 04/07/2020 CLINICAL DATA:  Shortness of breath.  Reported COVID-19 positive. EXAM: PORTABLE CHEST 1 VIEW COMPARISON:  April 03, 2020 FINDINGS: There is persistent airspace consolidation in the left base with small left pleural effusion. There is slightly less airspace consolidation in the right base compared to recent study. There is ill-defined opacity in the right mid lung, stable. More subtle ill-defined opacity is noted in the left mid lung. Heart size and pulmonary vascular normal. No adenopathy. There is aortic atherosclerosis. No bone lesions.  IMPRESSION: Areas of multifocal pneumonia with consolidation greatest in the left base region. Small left pleural effusion. Suspect atypical organism pneumonia as most likely etiology. Superimposed bacterial pneumonia in the left base is possible. Stable cardiac silhouette. Aortic Atherosclerosis (ICD10-I70.0). Electronically Signed   By: Bretta Bang III M.D.   On: 04/07/2020 08:08   DG Chest Port 1 View  Result Date: 04/03/2020 CLINICAL DATA:  History of COVID infection EXAM: PORTABLE CHEST 1 VIEW COMPARISON:  April 02, 2020 FINDINGS: Images rotated to the LEFT. Accounting for this rotation cardiomediastinal contours are stable. Increasing opacity at the LEFT lung base compared to the prior study. Also with increased opacity at the RIGHT lung base. Air bronchograms are seen at the LEFT lung base on today's study as well. Exam is more similar to the study of August 15. Probable small LEFT effusion as before. On  limited assessment skeletal structures are unremarkable. IMPRESSION: Bilateral opacities worse at the lung bases and worse in the LEFT lung base, similar to prior imaging in this patient with reported history of COVID-19 pneumonia. Electronically Signed   By: Donzetta Kohut M.D.   On: 04/03/2020 07:54   DG Chest Port 1 View  Result Date: 04/02/2020 CLINICAL DATA:  Acute respiratory failure with hypoxemia, COVID-19 EXAM: PORTABLE CHEST 1 VIEW COMPARISON:  Portable exam 0652 hours compared 03/30/2020 FINDINGS: Normal heart size, mediastinal contours, and pulmonary vascularity. Atherosclerotic calcification aorta. Scattered pulmonary infiltrates bilaterally consistent with multifocal pneumonia most confluent in the LEFT lower lobe. No pleural effusion or pneumothorax. Mild osseous demineralization. IMPRESSION: Persistent pulmonary infiltrates consistent with multifocal pneumonia and history of COVID-19, slightly improved. Electronically Signed   By: Ulyses Southward M.D.   On: 04/02/2020 08:34   DG Chest Port 1 View  Result Date: 03/30/2020 CLINICAL DATA:  Patient with history of COVID-19. Worsening shortness of breath. EXAM: PORTABLE CHEST 1 VIEW COMPARISON:  None. FINDINGS: Monitoring leads overlie the patient. Enlarged cardiac and mediastinal contours. Diffuse bilateral airspace opacities. Probable small left pleural effusion. No pneumothorax. IMPRESSION: 1. Cardiomegaly. 2. Diffuse bilateral airspace opacities most compatible with multifocal pneumonia. Electronically Signed   By: Annia Belt M.D.   On: 03/30/2020 12:51   VAS [place] LOWER EXTREMITY VENOUS (DVT)  Result Date: 04/02/2020  Lower Venous DVTStudy Other Indications: Covid positive with elevated d-dimer. Performing Technologist: Marilynne Halsted RDMS, RVT  Examination Guidelines: A complete evaluation includes B-mode imaging, spectral Doppler, color Doppler, and power Doppler as needed of all accessible portions of each vessel. Bilateral testing is  considered an integral part of a complete examination. Limited examinations for reoccurring indications may be performed as noted. The reflux portion of the exam is performed with the patient in reverse Trendelenburg.  +---------+---------------+---------+-----------+----------+--------------+ RIGHT    CompressibilityPhasicitySpontaneityPropertiesThrombus Aging +---------+---------------+---------+-----------+----------+--------------+ CFV      Full           Yes      Yes                                 +---------+---------------+---------+-----------+----------+--------------+ SFJ      Full                                                        +---------+---------------+---------+-----------+----------+--------------+ FV Prox  Full                                                        +---------+---------------+---------+-----------+----------+--------------+  FV Mid   Full                                                        +---------+---------------+---------+-----------+----------+--------------+ FV DistalFull                                                        +---------+---------------+---------+-----------+----------+--------------+ PFV      Full                                                        +---------+---------------+---------+-----------+----------+--------------+ POP      Full           Yes      Yes                                 +---------+---------------+---------+-----------+----------+--------------+ PTV      Full                                                        +---------+---------------+---------+-----------+----------+--------------+ PERO     Full                                                        +---------+---------------+---------+-----------+----------+--------------+   +---------+---------------+---------+-----------+----------+--------------+ LEFT      CompressibilityPhasicitySpontaneityPropertiesThrombus Aging +---------+---------------+---------+-----------+----------+--------------+ CFV      Full           Yes      Yes                                 +---------+---------------+---------+-----------+----------+--------------+ SFJ      Full                                                        +---------+---------------+---------+-----------+----------+--------------+ FV Prox  Full                                                        +---------+---------------+---------+-----------+----------+--------------+ FV Mid   Full                                                        +---------+---------------+---------+-----------+----------+--------------+  FV DistalFull                                                        +---------+---------------+---------+-----------+----------+--------------+ PFV      Full                                                        +---------+---------------+---------+-----------+----------+--------------+ POP      Full           Yes      Yes                                 +---------+---------------+---------+-----------+----------+--------------+ PTV      Full                                                        +---------+---------------+---------+-----------+----------+--------------+ PERO     Full                                                        +---------+---------------+---------+-----------+----------+--------------+     Summary: BILATERAL: - No evidence of deep vein thrombosis seen in the lower extremities, bilaterally. -No evidence of popliteal cyst, bilaterally.   *See table(s) above for measurements and observations. Electronically signed by Gretta Began MD on 04/02/2020 at 5:04:02 PM.    Final    ECHOCARDIOGRAM LIMITED  Result Date: 03/31/2020    ECHOCARDIOGRAM LIMITED REPORT   Patient Name:   Christina Russell Date of Exam: 03/31/2020 Medical Rec  #:  [id number]     Height:       62.0 in Accession #:    [id number]    Weight:       142.2 lb Date of Birth:  1951/05/21     BSA:          1.654 m Patient Age:    68 years      BP:           136/80 mmHg Patient Gender: F             HR:           86 bpm. Exam Location:  Inpatient Procedure: Limited Echo, Color Doppler and Cardiac Doppler Indications:    R06.9 DOE  History:        Patient has no prior history of Echocardiogram examinations.                 Risk Factors:Dyslipidemia.  Sonographer:    Irving Burton Senior RDCS Referring Phys: Humberto Seals  Sonographer Comments: COVID+ at time of study IMPRESSIONS  1. Abnormal septal motion with distal hypokinesis . Left ventricular ejection fraction, by estimation, is 50 to 55%. The left ventricle has low normal function. Left ventricular endocardial border not optimally defined to  evaluate regional wall motion. Left ventricular diastolic parameters are indeterminate.  2. The mitral valve is normal in structure. Mild mitral valve regurgitation.  3. The aortic valve is tricuspid. Aortic valve regurgitation is not visualized. Mild aortic valve sclerosis is present, with no evidence of aortic valve stenosis.  4. There is normal pulmonary artery systolic pressure.  5. The inferior vena cava is normal in size with greater than 50% respiratory variability, suggesting right atrial pressure of 3 mmHg. FINDINGS  Left Ventricle: Abnormal septal motion with distal hypokinesis. Left ventricular ejection fraction, by estimation, is 50 to 55%. The left ventricle has low normal function. Left ventricular endocardial border not optimally defined to evaluate regional wall motion. The left ventricular internal cavity size was normal in size. Right Ventricle: There is normal pulmonary artery systolic pressure. The tricuspid regurgitant velocity is 2.33 m/s, and with an assumed right atrial pressure of 3 mmHg, the estimated right ventricular systolic pressure is 24.7 mmHg. Mitral Valve:  The mitral valve is normal in structure. There is mild thickening of the mitral valve leaflet(s). There is mild calcification of the mitral valve leaflet(s). Mild mitral annular calcification. Mild mitral valve regurgitation. Tricuspid Valve: The tricuspid valve is normal in structure. Tricuspid valve regurgitation is mild. Aortic Valve: The aortic valve is tricuspid. Aortic valve regurgitation is not visualized. Mild aortic valve sclerosis is present, with no evidence of aortic valve stenosis. Venous: The inferior vena cava is normal in size with greater than 50% respiratory variability, suggesting right atrial pressure of 3 mmHg. IAS/Shunts: The interatrial septum was not well visualized. RIGHT VENTRICLE RV S prime:     10.80 cm/s TAPSE (M-mode): 1.7 cm AORTIC VALVE LVOT Vmax:   95.30 cm/s LVOT Vmean:  65.900 cm/s LVOT VTI:    0.180 m MITRAL VALVE               TRICUSPID VALVE MV Area (PHT): 4.21 cm    TR Peak grad:   21.7 mmHg MV Decel Time: 180 msec    TR Vmax:        233.00 cm/s MV E velocity: 57.70 cm/s MV A velocity: 73.20 cm/s  SHUNTS MV E/A ratio:  0.79        Systemic VTI: 0.18 m Charlton Haws MD Electronically signed by Charlton Haws MD Signature Date/Time: 03/31/2020/3:13:28 PM    Final

## 2020-04-08 NOTE — Progress Notes (Signed)
PMT provider chart review. Call placed to granddaughter, Christina Russell to provide update. No answer and vm box full. PMT provider will continue to follow. Please see previous PMT notes. Christina Russell should be arriving to [facility] 8/25. She has been preparing family of [name]'s condition and poor prognosis.   NO CHARGE  Vennie Homans, DNP, FNP-C Palliative Medicine Team  Phone: [phone] Fax: [phone]

## 2020-04-09 ENCOUNTER — Telehealth: Payer: Self-pay | Admitting: Gastroenterology

## 2020-04-09 ENCOUNTER — Telehealth: Payer: Self-pay

## 2020-04-09 LAB — CBC WITH DIFFERENTIAL/PLATELET
Abs Immature Granulocytes: 0.26 10*3/uL — ABNORMAL HIGH (ref 0.00–0.07)
Basophils Absolute: 0 10*3/uL (ref 0.0–0.1)
Basophils Relative: 0 %
Eosinophils Absolute: 0 10*3/uL (ref 0.0–0.5)
Eosinophils Relative: 0 %
HCT: 37.8 % (ref 36.0–46.0)
Hemoglobin: 11.9 g/dL — ABNORMAL LOW (ref 12.0–15.0)
Immature Granulocytes: 2 %
Lymphocytes Relative: 9 %
Lymphs Abs: 1.3 10*3/uL (ref 0.7–4.0)
MCH: 29.4 pg (ref 26.0–34.0)
MCHC: 31.5 g/dL (ref 30.0–36.0)
MCV: 93.3 fL (ref 80.0–100.0)
Monocytes Absolute: 0.8 10*3/uL (ref 0.1–1.0)
Monocytes Relative: 6 %
Neutro Abs: 12.1 10*3/uL — ABNORMAL HIGH (ref 1.7–7.7)
Neutrophils Relative %: 83 %
Platelets: 286 10*3/uL (ref 150–400)
RBC: 4.05 MIL/uL (ref 3.87–5.11)
RDW: 13.8 % (ref 11.5–15.5)
WBC: 14.5 10*3/uL — ABNORMAL HIGH (ref 4.0–10.5)
nRBC: 0 % (ref 0.0–0.2)

## 2020-04-09 LAB — COMPREHENSIVE METABOLIC PANEL
ALT: 22 U/L (ref 0–44)
AST: 19 U/L (ref 15–41)
Albumin: 1.9 g/dL — ABNORMAL LOW (ref 3.5–5.0)
Alkaline Phosphatase: 48 U/L (ref 38–126)
Anion gap: 9 (ref 5–15)
BUN: 18 mg/dL (ref 8–23)
CO2: 26 mmol/L (ref 22–32)
Calcium: 8.3 mg/dL — ABNORMAL LOW (ref 8.9–10.3)
Chloride: 106 mmol/L (ref 98–111)
Creatinine, Ser: 0.69 mg/dL (ref 0.44–1.00)
GFR calc Af Amer: >60 mL/min (ref >60)
GFR calc non Af Amer: >60 mL/min (ref >60)
Glucose, Bld: 176 mg/dL — ABNORMAL HIGH (ref 70–99)
Potassium: 4.6 mmol/L (ref 3.5–5.1)
Sodium: 141 mmol/L (ref 135–145)
Total Bilirubin: 0.6 mg/dL (ref 0.3–1.2)
Total Protein: 6 g/dL — ABNORMAL LOW (ref 6.5–8.1)

## 2020-04-09 LAB — BRAIN NATRIURETIC PEPTIDE: B Natriuretic Peptide: 46.1 pg/mL (ref 0.0–100.0)

## 2020-04-09 LAB — D-DIMER, QUANTITATIVE: D-Dimer, Quant: 3.3 ug/mL-FEU — ABNORMAL HIGH (ref 0.00–0.50)

## 2020-04-09 LAB — C-REACTIVE PROTEIN: CRP: 1.4 mg/dL — ABNORMAL HIGH (ref <1.0)

## 2020-04-09 LAB — PROCALCITONIN: Procalcitonin: <0.1 ng/mL

## 2020-04-09 LAB — MAGNESIUM: Magnesium: 2.3 mg/dL (ref 1.7–2.4)

## 2020-04-09 NOTE — Progress Notes (Signed)
PROGRESS NOTE                                                                                                                                                                                                             Patient Demographics:    Christina Russell, is a 69 y.o. female, DOB - February 08, 1951, LEX:[id number]  Outpatient Primary MD for the patient is [name], MD    LOS - 10  Admit date - 03/30/2020    Chief Complaint  Patient presents with  . Covid/ Distress  . COVID positive       Brief Narrative 69 year old African-American female who is not vaccinated for Covid and has underlying history of dementia, dyslipidemia who presented to the hospital with shortness of breath.  She was diagnosed with acute hypoxic respiratory failure due to COVID-19 pneumonia and admitted to ICU on heated high flow.  She also developed some CHF.  She was stabilized and subsequently transferred to my service on 04/02/2020.   Subjective:   Patient remains confused overnight, she cannot give any reliable history, but no significant events as discussed with staff.    Assessment  & Plan :     1. Acute Hypoxic Resp. Failure due to Acute Covid 19 Viral Pneumonitis during the ongoing 2020 Covid 19 Pandemic - she unfortunately is unvaccinated and has incurred severe parenchymal injury, has been placed on steroids, Remdesivir and Baricitinib, on HHFL 55 Lit, overall remains with significant oxygen requirement she is on 3 L heated high flow nasal cannula, as well as been treated for possible aspiration pneumonia finished total of 7 days of IV Unasyn, she is currently on dysphagia 1 diet with nectar thick liquids, aspiration precaution.  She has very guarded diagnosis, she is currently DNR., she is DNR if declines further comfort measures.  Palliative care also consulted and following.  Encouraged the patient to sit up in chair in the daytime use I-S and  flutter valve for pulmonary toiletry and then prone in bed when at night.  Will advance activity and titrate down oxygen as possible.    SpO2: 91 % O2 Flow Rate (L/min): 30 L/min FiO2 (%): 100 %    Recent Labs  Lab 04/04/20 0727 04/04/20 0727 04/05/20 0658 04/05/20 0745 04/06/20 0255 04/07/20 0323 04/08/20 0557 04/09/20 0701  WBC 12.9*  --   --   --  15.1* 15.6* 17.5* 14.5*  CRP 22.1*   < >  --  11.9* 7.8* 6.1* 3.3* 1.4*  DDIMER 12.14*  --   --   --  8.05* 3.50* 3.66* 3.30*  BNP 35.6   < > 50.7  --  57.0 45.4 42.7 46.1  PROCALCITON 0.11   < > <0.10  --  <0.10 <0.10 <0.10 <0.10  AST 23  --   --   --  19 19 20 19   ALT 20  --   --   --  19 19 21 22   ALKPHOS 63  --   --   --  57 53 52 48  BILITOT 0.6  --   --   --  0.3 0.7 0.8 0.6  ALBUMIN 2.4*  --   --   --  2.3* 2.0* 2.1* 1.9*   < > = values in this interval not displayed.       2.  Extremely high D dimer - high risk for a clot, full dose Lovenox until D-dimer dips below 2, -ve Leg [place], stable R Atrial pressure on TTE, too hypoxic for CTA.  3.  Underlying history of dementia.  At high risk for delirium, minimize narcotics and benzodiazepines, daughter has been counseled.  Currently does have mild toxic and metabolic encephalopathy on top of her dementia.  4.  Dyslipidemia.  Continue home dose statin.  5.  Acute on chronic diastolic heart failure with EF 50 to 55%. Outpatient cardiology follow-up for some distal septal hypokinesis, continue combination of aspirin, statin and beta-blocker for secondary prevention for now.  Diuresis on hold given some hyponatremia and evidence of volume depletion on echo.   6. Hypernatremia -resolved with D5W  7.  Possible aspiration pneumonia.  Speech following, on dysphagia 1 diet with nectar thick liquids, feeding assistance aspiration precautions, Unasyn.  MRSA nasal swab negative.     Condition - Extremely Guarded ++  Family Communication  : I have discussed with daughter who is  hospitalized as well, and will update granddaughter later today.  Code Status : DNR - DW daughter & grand daughter  Consults  :  PCCM  Procedures  :    Leg [place] - No DVT  TTE - 1. Abnormal septal motion with distal hypokinesis . Left ventricular ejection fraction, by estimation, is 50 to 55%. The left ventricle has low normal function. Left ventricular endocardial border not optimally defined to evaluate regional wall motion. Left ventricular diastolic parameters are indeterminate.  2. The mitral valve is normal in structure. Mild mitral valve regurgitation.  3. The aortic valve is tricuspid. Aortic valve regurgitation is not visualized. Mild aortic valve sclerosis is present, with no evidence of aortic valve stenosis.  4. There is normal pulmonary artery systolic pressure.  5. The inferior vena cava is normal in size with greater than 50% respiratory variability, suggesting right atrial pressure of 3 mmHg.   PUD Prophylaxis :  PPI  Disposition Plan  :    Status is: Inpatient  Remains inpatient appropriate because:IV treatments appropriate due to intensity of illness or inability to take PO   Dispo: The patient is from: Home              Anticipated d/c is to: Home              Anticipated d/c date is: > 3 days              Patient currently is not medically stable to d/c.  DVT Prophylaxis  :  Lovenox   Lab Results  Component Value Date   PLT 286 04/09/2020    Diet :  Diet Order            DIET - DYS 1 Room service appropriate? No; Fluid consistency: Nectar Thick  Diet effective now                  Inpatient Medications  Scheduled Meds: . albuterol  2 puff Inhalation TID  . vitamin C  500 mg Oral Daily  . atorvastatin  40 mg Oral Daily  . baricitinib  2 mg Oral Daily  . enoxaparin (LOVENOX) injection  60 mg Subcutaneous Q12H  . mouth rinse  15 mL Mouth Rinse BID  . methylPREDNISolone (SOLU-MEDROL) injection  60 mg Intravenous BID  . metoprolol tartrate  12.5 mg  Oral BID  . pantoprazole  40 mg Oral Daily  . zinc sulfate  220 mg Oral Daily   Continuous Infusions: . sodium chloride 10 mL/hr at 04/09/20 0450  . ampicillin-sulbactam (UNASYN) IV 3 g (04/09/20 0929)   PRN Meds:.sodium chloride, acetaminophen, chlorpheniramine-HYDROcodone, docusate sodium, [DISCONTINUED] ondansetron **OR** ondansetron (ZOFRAN) IV, polyethylene glycol, Resource ThickenUp Clear  Antibiotics  :    Anti-infectives (From admission, onward)   Start     Dose/Rate Route Frequency Ordered Stop   04/03/20 1000  Ampicillin-Sulbactam (UNASYN) 3 g in sodium chloride 0.9 % 100 mL IVPB        3 g 200 mL/hr over 30 Minutes Intravenous Every 6 hours 04/03/20 0935 04/09/20 2159   03/31/20 1000  remdesivir 100 mg in sodium chloride 0.9 % 100 mL IVPB       "Followed by" Linked Group Details   100 mg 200 mL/hr over 30 Minutes Intravenous Daily 03/30/20 1445 04/03/20 0917   03/30/20 1445  remdesivir 200 mg in sodium chloride 0.9% 250 mL IVPB       "Followed by" Linked Group Details   200 mg 580 mL/hr over 30 Minutes Intravenous Once 03/30/20 1445 03/30/20 1714       Time Spent in minutes  30   Huey Bienenstock M.D on 04/09/2020 at 11:02 AM  To page go to [email or web address]  [facility] -  Office  [phone]    See all Orders from today for further details    Objective:   Vitals:   04/08/20 2142 04/09/20 0000 04/09/20 0400 04/09/20 0800  BP: 133/73 117/66 126/76 131/78  Pulse:  77 70   Resp: (!) 21 (!) Temp:  98.5 F (36.9 C) 98.3 F (36.8 C) 98 F (36.7 C)  TempSrc:  Oral Axillary Oral  SpO2: 94% (!) 89% 97% 91%  Weight:   60 kg   Height:        Wt Readings from Last 3 Encounters:  04/09/20 60 kg  01/30/20 65.3 kg  12/05/19 66 kg     Intake/Output Summary (Last 24 hours) at 04/09/2020 1102 Last data filed at 04/09/2020 0900 Gross per 24 hour  Intake 1434.24 ml  Output 1100 ml  Net 334.24 ml     Physical Exam  Awake , confused,  sitting in bed in no apparent distress . Symmetrical Chest wall movement, Good air movement bilaterally, CTAB RRR,No Gallops,Rubs or new Murmurs, No Parasternal Heave +ve B.Sounds, Abd Soft, No tenderness, No rebound - guarding or rigidity. No Cyanosis, Clubbing or edema, No new Rash or bruise       Data Review:  CBC Recent Labs  Lab 04/04/20 0727 04/06/20 0255 04/07/20 0323 04/08/20 0557 04/09/20 0701  WBC 12.9* 15.1* 15.6* 17.5* 14.5*  HGB 13.6 13.4 13.1 13.2 11.9*  HCT 43.2 42.8 42.3 42.1 37.8  PLT 261 311 297 312 286  MCV 92.5 94.1 94.0 94.4 93.3  MCH 29.1 29.5 29.1 29.6 29.4  MCHC 31.5 31.3 31.0 31.4 31.5  RDW 14.2 14.2 14.0 13.8 13.8  LYMPHSABS 0.9 0.5* 0.9 1.3 1.3  MONOABS 0.7 0.8 0.6 0.7 0.8  EOSABS 0.0 0.0 0.0 0.0 0.0  BASOSABS 0.0 0.0 0.0 0.0 0.0    Chemistries  Recent Labs  Lab 04/04/20 0727 04/04/20 0727 04/05/20 0745 04/06/20 0255 04/07/20 0323 04/08/20 0557 04/09/20 0701  NA 140   < > 147* 145 148* 143 141  K 4.0   < > 4.0 3.7 4.7 4.7 4.6  CL 100   < > 101 100 104 103 106  CO2 28   < > 31 29 34* 28 26  GLUCOSE 172*   < > 157* 356* 181* 174* 176*  BUN 26*   < > 30* 28* 17 19 18   CREATININE 0.78   < > 0.77 0.94 0.74 0.75 0.69  CALCIUM 8.5*   < > 8.9 9.0 8.9 8.7* 8.3*  AST 23  --   --  19 19 20 19   ALT 20  --   --  19 19 21 22   ALKPHOS 63  --   --  57 53 52 48  BILITOT 0.6  --   --  0.3 0.7 0.8 0.6  MG 2.7*   < > 2.6* 2.5* 2.5* 2.4 2.3   < > = values in this interval not displayed.     ------------------------------------------------------------------------------------------------------------------ No results for input(s): CHOL, HDL, LDLCALC, TRIG, CHOLHDL, LDLDIRECT in the last 72 hours.  Lab Results  Component Value Date   HGBA1C 5.3 04/15/2017   ------------------------------------------------------------------------------------------------------------------ No results for input(s): TSH, T4TOTAL, T3FREE, THYROIDAB in the last 72  hours.  Invalid input(s): FREET3  Cardiac Enzymes No results for input(s): CKMB, TROPONINI, MYOGLOBIN in the last 168 hours.  Invalid input(s): CK ------------------------------------------------------------------------------------------------------------------    Component Value Date/Time   BNP 46.1 04/09/2020 0701    Micro Results Recent Results (from the past 240 hour(s))  Blood Culture (routine x 2)     Status: None   Collection Time: 03/30/20 11:56 AM   Specimen: BLOOD RIGHT FOREARM  Result Value Ref Range Status   Specimen Description BLOOD RIGHT FOREARM  Final   Special Requests   Final    BOTTLES DRAWN AEROBIC AND ANAEROBIC Blood Culture results may not be optimal due to an excessive volume of blood received in culture bottles   Culture   Final    NO GROWTH 5 DAYS Performed at [facility] [place], [address], [place], [place] [address]    Report Status 04/04/2020 FINAL  Final  SARS Coronavirus 2 by RT PCR (hospital order, performed in [facility] hospital lab) Nasopharyngeal Nasopharyngeal Swab     Status: Abnormal   Collection Time: 03/30/20 12:02 PM   Specimen: Nasopharyngeal Swab  Result Value Ref Range Status   SARS Coronavirus 2 POSITIVE (A) NEGATIVE Final    Comment: RESULT CALLED TO, READ BACK BY AND VERIFIED WITH: RN E DIXON [id number] MLM (NOTE) SARS-CoV-2 target nucleic acids are DETECTED  SARS-CoV-2 RNA is generally detectable in upper respiratory specimens  during the acute phase of infection.  Positive results are indicative  of the  presence of the identified virus, but do not rule out bacterial infection or co-infection with other pathogens not detected by the test.  Clinical correlation with patient history and  other diagnostic information is necessary to determine patient infection status.  The expected result is negative.  Fact Sheet for Patients:   BoilerBrush.com.cy   Fact Sheet for Healthcare Providers:     https://pope.com/    This test is not yet approved or cleared by the [place] FDA and  has been authorized for detection and/or diagnosis of SARS-CoV-2 by FDA under an Emergency Use Authorization (EUA).  This EUA will remain in effect (meaning this test can b e used) for the duration of  the COVID-19 declaration under Section 564(b)(1) of the Act, 21 U.S.C. section 360-bbb-3(b)(1), unless the authorization is terminated or revoked sooner.  Performed at [facility], [address], [place], [place] [address]   Blood Culture (routine x 2)     Status: None   Collection Time: 03/30/20 12:27 PM   Specimen: BLOOD  Result Value Ref Range Status   Specimen Description BLOOD LEFT ANTECUBITAL  Final   Special Requests   Final    BOTTLES DRAWN AEROBIC ONLY Blood Culture results may not be optimal due to an inadequate volume of blood received in culture bottles   Culture   Final    NO GROWTH 5 DAYS Performed at [facility], [address], [place], [place] [address]    Report Status 04/04/2020 FINAL  Final  MRSA PCR Screening     Status: None   Collection Time: 03/31/20 12:01 AM   Specimen: Nasopharyngeal  Result Value Ref Range Status   MRSA by PCR NEGATIVE NEGATIVE Final    Comment:        The GeneXpert MRSA Assay (FDA approved for NASAL specimens only), is one component of a comprehensive MRSA colonization surveillance program. It is not intended to diagnose MRSA infection nor to guide or monitor treatment for MRSA infections. Performed at [facility], [address], [place], [place] [address]     Radiology Reports DG Chest [place] 1 View  Result Date: 04/07/2020 CLINICAL DATA:  Shortness of breath.  Reported COVID-19 positive. EXAM: PORTABLE CHEST 1 VIEW COMPARISON:  April 03, 2020 FINDINGS: There is persistent airspace consolidation in the left base with small left pleural effusion. There is slightly less airspace  consolidation in the right base compared to recent study. There is ill-defined opacity in the right mid lung, stable. More subtle ill-defined opacity is noted in the left mid lung. Heart size and pulmonary vascular normal. No adenopathy. There is aortic atherosclerosis. No bone lesions. IMPRESSION: Areas of multifocal pneumonia with consolidation greatest in the left base region. Small left pleural effusion. Suspect atypical organism pneumonia as most likely etiology. Superimposed bacterial pneumonia in the left base is possible. Stable cardiac silhouette. Aortic Atherosclerosis (ICD10-I70.0). Electronically Signed   By: Bretta Bang III M.D.   On: 04/07/2020 08:08   DG Chest Port 1 View  Result Date: 04/03/2020 CLINICAL DATA:  History of COVID infection EXAM: PORTABLE CHEST 1 VIEW COMPARISON:  April 02, 2020 FINDINGS: Images rotated to the LEFT. Accounting for this rotation cardiomediastinal contours are stable. Increasing opacity at the LEFT lung base compared to the prior study. Also with increased opacity at the RIGHT lung base. Air bronchograms are seen at the LEFT lung base on today's study as well. Exam is more similar to the study of August 15.  Probable small LEFT effusion as before. On limited assessment skeletal structures are unremarkable. IMPRESSION: Bilateral opacities worse at the lung bases and worse in the LEFT lung base, similar to prior imaging in this patient with reported history of COVID-19 pneumonia. Electronically Signed   By: Donzetta Kohut M.D.   On: 04/03/2020 07:54   DG Chest Port 1 View  Result Date: 04/02/2020 CLINICAL DATA:  Acute respiratory failure with hypoxemia, COVID-19 EXAM: PORTABLE CHEST 1 VIEW COMPARISON:  Portable exam 0652 hours compared 03/30/2020 FINDINGS: Normal heart size, mediastinal contours, and pulmonary vascularity. Atherosclerotic calcification aorta. Scattered pulmonary infiltrates bilaterally consistent with multifocal pneumonia most confluent in the  LEFT lower lobe. No pleural effusion or pneumothorax. Mild osseous demineralization. IMPRESSION: Persistent pulmonary infiltrates consistent with multifocal pneumonia and history of COVID-19, slightly improved. Electronically Signed   By: Ulyses Southward M.D.   On: 04/02/2020 08:34   DG Chest Port 1 View  Result Date: 03/30/2020 CLINICAL DATA:  Patient with history of COVID-19. Worsening shortness of breath. EXAM: PORTABLE CHEST 1 VIEW COMPARISON:  None. FINDINGS: Monitoring leads overlie the patient. Enlarged cardiac and mediastinal contours. Diffuse bilateral airspace opacities. Probable small left pleural effusion. No pneumothorax. IMPRESSION: 1. Cardiomegaly. 2. Diffuse bilateral airspace opacities most compatible with multifocal pneumonia. Electronically Signed   By: Annia Belt M.D.   On: 03/30/2020 12:51   VAS [place] LOWER EXTREMITY VENOUS (DVT)  Result Date: 04/02/2020  Lower Venous DVTStudy Other Indications: Covid positive with elevated d-dimer. Performing Technologist: Marilynne Halsted RDMS, RVT  Examination Guidelines: A complete evaluation includes B-mode imaging, spectral Doppler, color Doppler, and power Doppler as needed of all accessible portions of each vessel. Bilateral testing is considered an integral part of a complete examination. Limited examinations for reoccurring indications may be performed as noted. The reflux portion of the exam is performed with the patient in reverse Trendelenburg.  +---------+---------------+---------+-----------+----------+--------------+ RIGHT    CompressibilityPhasicitySpontaneityPropertiesThrombus Aging +---------+---------------+---------+-----------+----------+--------------+ CFV      Full           Yes      Yes                                 +---------+---------------+---------+-----------+----------+--------------+ SFJ      Full                                                         +---------+---------------+---------+-----------+----------+--------------+ FV Prox  Full                                                        +---------+---------------+---------+-----------+----------+--------------+ FV Mid   Full                                                        +---------+---------------+---------+-----------+----------+--------------+ FV DistalFull                                                        +---------+---------------+---------+-----------+----------+--------------+  PFV      Full                                                        +---------+---------------+---------+-----------+----------+--------------+ POP      Full           Yes      Yes                                 +---------+---------------+---------+-----------+----------+--------------+ PTV      Full                                                        +---------+---------------+---------+-----------+----------+--------------+ PERO     Full                                                        +---------+---------------+---------+-----------+----------+--------------+   +---------+---------------+---------+-----------+----------+--------------+ LEFT     CompressibilityPhasicitySpontaneityPropertiesThrombus Aging +---------+---------------+---------+-----------+----------+--------------+ CFV      Full           Yes      Yes                                 +---------+---------------+---------+-----------+----------+--------------+ SFJ      Full                                                        +---------+---------------+---------+-----------+----------+--------------+ FV Prox  Full                                                        +---------+---------------+---------+-----------+----------+--------------+ FV Mid   Full                                                         +---------+---------------+---------+-----------+----------+--------------+ FV DistalFull                                                        +---------+---------------+---------+-----------+----------+--------------+ PFV      Full                                                        +---------+---------------+---------+-----------+----------+--------------+  POP      Full           Yes      Yes                                 +---------+---------------+---------+-----------+----------+--------------+ PTV      Full                                                        +---------+---------------+---------+-----------+----------+--------------+ PERO     Full                                                        +---------+---------------+---------+-----------+----------+--------------+     Summary: BILATERAL: - No evidence of deep vein thrombosis seen in the lower extremities, bilaterally. -No evidence of popliteal cyst, bilaterally.   *See table(s) above for measurements and observations. Electronically signed by Gretta Began MD on 04/02/2020 at 5:04:02 PM.    Final    ECHOCARDIOGRAM LIMITED  Result Date: 03/31/2020    ECHOCARDIOGRAM LIMITED REPORT   Patient Name:   CALVINA LIPTAK Date of Exam: 03/31/2020 Medical Rec #:  [id number]     Height:       62.0 in Accession #:    [id number]    Weight:       142.2 lb Date of Birth:  26-Jun-1951     BSA:          1.654 m Patient Age:    68 years      BP:           136/80 mmHg Patient Gender: F             HR:           86 bpm. Exam Location:  Inpatient Procedure: Limited Echo, Color Doppler and Cardiac Doppler Indications:    R06.9 DOE  History:        Patient has no prior history of Echocardiogram examinations.                 Risk Factors:Dyslipidemia.  Sonographer:    Irving Burton Senior RDCS Referring Phys: Humberto Seals  Sonographer Comments: COVID+ at time of study IMPRESSIONS  1. Abnormal septal motion with distal hypokinesis .  Left ventricular ejection fraction, by estimation, is 50 to 55%. The left ventricle has low normal function. Left ventricular endocardial border not optimally defined to evaluate regional wall motion. Left ventricular diastolic parameters are indeterminate.  2. The mitral valve is normal in structure. Mild mitral valve regurgitation.  3. The aortic valve is tricuspid. Aortic valve regurgitation is not visualized. Mild aortic valve sclerosis is present, with no evidence of aortic valve stenosis.  4. There is normal pulmonary artery systolic pressure.  5. The inferior vena cava is normal in size with greater than 50% respiratory variability, suggesting right atrial pressure of 3 mmHg. FINDINGS  Left Ventricle: Abnormal septal motion with distal hypokinesis. Left ventricular ejection fraction, by estimation, is 50 to 55%. The left ventricle has low normal function. Left ventricular endocardial border not optimally defined to evaluate regional wall motion. The  left ventricular internal cavity size was normal in size. Right Ventricle: There is normal pulmonary artery systolic pressure. The tricuspid regurgitant velocity is 2.33 m/s, and with an assumed right atrial pressure of 3 mmHg, the estimated right ventricular systolic pressure is 24.7 mmHg. Mitral Valve: The mitral valve is normal in structure. There is mild thickening of the mitral valve leaflet(s). There is mild calcification of the mitral valve leaflet(s). Mild mitral annular calcification. Mild mitral valve regurgitation. Tricuspid Valve: The tricuspid valve is normal in structure. Tricuspid valve regurgitation is mild. Aortic Valve: The aortic valve is tricuspid. Aortic valve regurgitation is not visualized. Mild aortic valve sclerosis is present, with no evidence of aortic valve stenosis. Venous: The inferior vena cava is normal in size with greater than 50% respiratory variability, suggesting right atrial pressure of 3 mmHg. IAS/Shunts: The interatrial  septum was not well visualized. RIGHT VENTRICLE RV S prime:     10.80 cm/s TAPSE (M-mode): 1.7 cm AORTIC VALVE LVOT Vmax:   95.30 cm/s LVOT Vmean:  65.900 cm/s LVOT VTI:    0.180 m MITRAL VALVE               TRICUSPID VALVE MV Area (PHT): 4.21 cm    TR Peak grad:   21.7 mmHg MV Decel Time: 180 msec    TR Vmax:        233.00 cm/s MV E velocity: 57.70 cm/s MV A velocity: 73.20 cm/s  SHUNTS MV E/A ratio:  0.79        Systemic VTI: 0.18 m Charlton Haws MD Electronically signed by Charlton Haws MD Signature Date/Time: 03/31/2020/3:13:28 PM    Final

## 2020-04-09 NOTE — Progress Notes (Signed)
Pharmacy Antibiotic Note  Christina Russell is a 69 y.o. female admitted on 03/30/2020 with aspiration pneumonia.  Pharmacy has been consulted for unasyn dosing.  Pt is s/p remdesivir for COVID PNA. She continues to require HFNC and possible aspiration. Afebrile. PCT/CRP down, WBL elevated due to steroids. Today is day 7 of unasyn, per discussion with MD, ok to stop today.   Plan: Stop Unasyn 3g IV q6 today   Height: 5\' 2"  (157.5 cm) Weight: 60 kg (132 lb 4.4 oz) IBW/kg (Calculated) : 50.1  Temp (24hrs), Avg:98.1 F (36.7 C), Min:97.6 F (36.4 C), Max:98.5 F (36.9 C)  Recent Labs  Lab 04/04/20 0727 04/04/20 0727 04/05/20 0745 04/06/20 0255 04/07/20 0323 04/08/20 0557 04/09/20 0701  WBC 12.9*  --   --  15.1* 15.6* 17.5* 14.5*  CREATININE 0.78   < > 0.77 0.94 0.74 0.75 0.69   < > = values in this interval not displayed.    Estimated Creatinine Clearance: 51.8 mL/min (by C-G formula based on SCr of 0.69 mg/dL).    No Known Allergies  Antimicrobials this admission: 8/19 unasyn>>8/25  Microbiology results: 8/15 blood>>ngtd 8/16 MRSA>>neg  9/16, PharmD, BCPS, [facility] Clinical Pharmacist  Please check AMION for all [facility] Pharm[facility] phone numbers After 10:00 PM, call Main Pharmacy [phone]

## 2020-04-09 NOTE — Telephone Encounter (Signed)
Thank you :)

## 2020-04-09 NOTE — Telephone Encounter (Signed)
Patient is scheduled to see Dr. Orvan Falconer on 04/10/20 at 1100 for a colonoscopy. Patient is currently admitted to the hospital for respiratory distress. She also tested positive for covid on 03/27/20. Will call and give office numbers to call to reschedule when she is discharged from the hospital.

## 2020-04-10 ENCOUNTER — Encounter: Payer: Medicare Other | Admitting: Gastroenterology

## 2020-04-10 LAB — COMPREHENSIVE METABOLIC PANEL
ALT: 24 U/L (ref 0–44)
AST: 18 U/L (ref 15–41)
Albumin: 1.9 g/dL — ABNORMAL LOW (ref 3.5–5.0)
Alkaline Phosphatase: 48 U/L (ref 38–126)
Anion gap: 11 (ref 5–15)
BUN: 18 mg/dL (ref 8–23)
CO2: 24 mmol/L (ref 22–32)
Calcium: 8.6 mg/dL — ABNORMAL LOW (ref 8.9–10.3)
Chloride: 102 mmol/L (ref 98–111)
Creatinine, Ser: 0.58 mg/dL (ref 0.44–1.00)
GFR calc Af Amer: >60 mL/min (ref >60)
GFR calc non Af Amer: >60 mL/min (ref >60)
Glucose, Bld: 227 mg/dL — ABNORMAL HIGH (ref 70–99)
Potassium: 4.5 mmol/L (ref 3.5–5.1)
Sodium: 137 mmol/L (ref 135–145)
Total Bilirubin: 0.5 mg/dL (ref 0.3–1.2)
Total Protein: 5.9 g/dL — ABNORMAL LOW (ref 6.5–8.1)

## 2020-04-10 LAB — CBC
HCT: 38.4 % (ref 36.0–46.0)
Hemoglobin: 12.2 g/dL (ref 12.0–15.0)
MCH: 29.7 pg (ref 26.0–34.0)
MCHC: 31.8 g/dL (ref 30.0–36.0)
MCV: 93.4 fL (ref 80.0–100.0)
Platelets: 312 10*3/uL (ref 150–400)
RBC: 4.11 MIL/uL (ref 3.87–5.11)
RDW: 13.8 % (ref 11.5–15.5)
WBC: 19.6 10*3/uL — ABNORMAL HIGH (ref 4.0–10.5)
nRBC: 0 % (ref 0.0–0.2)

## 2020-04-10 LAB — D-DIMER, QUANTITATIVE: D-Dimer, Quant: 3.27 ug/mL-FEU — ABNORMAL HIGH (ref 0.00–0.50)

## 2020-04-10 MED ORDER — METHYLPREDNISOLONE SODIUM SUCC 40 MG IJ SOLR
40.0000 mg | Freq: Two times a day (BID) | INTRAMUSCULAR | Status: DC
  Administered 2020-04-10 – 2020-04-13 (×6): 40 mg via INTRAVENOUS
  Filled 2020-04-10 (×6): qty 1

## 2020-04-10 MED ORDER — ENOXAPARIN SODIUM 30 MG/0.3ML ~~LOC~~ SOLN
30.0000 mg | Freq: Two times a day (BID) | SUBCUTANEOUS | Status: DC
  Administered 2020-04-10 – 2020-04-12 (×5): 30 mg via SUBCUTANEOUS
  Filled 2020-04-10 (×5): qty 0.3

## 2020-04-10 NOTE — Progress Notes (Signed)
PROGRESS NOTE                                                                                                                                                                                                             Patient Demographics:    Christina Russell, is a 69 y.o. female, DOB - March 17, 1951, LZJ:[id number]  Outpatient Primary MD for the patient is [name], MD    LOS - 11  Admit date - 03/30/2020    Chief Complaint  Patient presents with  . Covid/ Distress  . COVID positive       Brief Narrative 69 year old African-American female who is not vaccinated for Covid and has underlying history of dementia, dyslipidemia who presented to the hospital with shortness of breath.  She was diagnosed with acute hypoxic respiratory failure due to COVID-19 pneumonia and admitted to ICU on heated high flow.  She also developed some CHF.  She was stabilized and subsequently transferred to my service on 04/02/2020.   Subjective:   Patient remains confused overnight, she cannot give any reliable history, but no significant events as discussed with staff.    Assessment  & Plan :     1. Acute Hypoxic Resp. Failure due to Acute Covid 19 Viral Pneumonitis during the ongoing 2020 Covid 19 Pandemic -  unfortunately she is unvaccinated and has incurred severe parenchymal injury, has been placed on steroids, Remdesivir and Baricitinib, on HHFL 55 Lit, overall remains with significant oxygen requirement , it is improving, this morning she is on 25 L heated high flow nasal cannula , will she did finish 7 days of IV Unasyn to treat aspiration pneumonia ,, she is currently on dysphagia 1 diet with nectar thick liquids, aspiration precaution.  She has very guarded diagnosis, she is currently DNR., she is DNR if declines further comfort measures.  Palliative care also consulted and following.  Encouraged the patient to sit up in chair in the daytime  use I-S and flutter valve for pulmonary toiletry and then prone in bed when at night.  Will advance activity and titrate down oxygen as possible.    SpO2: 90 % O2 Flow Rate (L/min): 25 L/min FiO2 (%): 90 %    Recent Labs  Lab 04/04/20 0727 04/05/20 0658 04/05/20 0745 04/06/20 0255 04/07/20 0323 04/08/20 0557 04/09/20 0701 04/10/20 0254  WBC   < >  --   --  15.1* 15.6* 17.5* 14.5* 19.6*  CRP   < >  --  11.9* 7.8* 6.1* 3.3* 1.4*  --   DDIMER   < >  --   --  8.05* 3.50* 3.66* 3.30* 3.27*  BNP  --  50.7  --  57.0 45.4 42.7 46.1  --   PROCALCITON  --  <0.10  --  <0.10 <0.10 <0.10 <0.10  --   AST   < >  --   --  ALT   < >  --   --  ALKPHOS   < >  --   --  57 53 52 48 48  BILITOT   < >  --   --  0.3 0.7 0.8 0.6 0.5  ALBUMIN   < >  --   --  2.3* 2.0* 2.1* 1.9* 1.9*   < > = values in this interval not displayed.       2.  Extremely high D dimer - high risk for a clot -ve Leg [place], stable R Atrial pressure on TTE, difficulty trending down, it is 3.2 today, will decrease her Lovenox to 0.5 mg/kg every 12 hours.  3.  Underlying history of dementia.  At high risk for delirium, minimize narcotics and benzodiazepines, daughter has been counseled.  Currently does have mild toxic and metabolic encephalopathy on top of her dementia.  4.  Dyslipidemia.  Continue home dose statin.  5.  Acute on chronic diastolic heart failure with EF 50 to 55%. Outpatient cardiology follow-up for some distal septal hypokinesis, continue combination of aspirin, statin and beta-blocker for secondary prevention for now.  Diuresis on hold given some hyponatremia and evidence of volume depletion on echo.   6. Hypernatremia -resolved with D5W  7.  Possible aspiration pneumonia.  Speech following, on dysphagia 1 diet with nectar thick liquids, feeding assistance aspiration precautions, Unasyn.  MRSA nasal swab negative.     Condition - Extremely Guarded ++  Family Communication  :  I have discussed with daughter who is hospitalized as well, left her granddaughter a voicemail 8/25  Code Status : DNR  Consults  :  PCCM  Procedures  :    Leg [place] - No DVT  TTE - 1. Abnormal septal motion with distal hypokinesis . Left ventricular ejection fraction, by estimation, is 50 to 55%. The left ventricle has low normal function. Left ventricular endocardial border not optimally defined to evaluate regional wall motion. Left ventricular diastolic parameters are indeterminate.  2. The mitral valve is normal in structure. Mild mitral valve regurgitation.  3. The aortic valve is tricuspid. Aortic valve regurgitation is not visualized. Mild aortic valve sclerosis is present, with no evidence of aortic valve stenosis.  4. There is normal pulmonary artery systolic pressure.  5. The inferior vena cava is normal in size with greater than 50% respiratory variability, suggesting right atrial pressure of 3 mmHg.   PUD Prophylaxis :  PPI  Disposition Plan  :    Status is: Inpatient  Remains inpatient appropriate because:IV treatments appropriate due to intensity of illness or inability to take PO   Dispo: The patient is from: Home              Anticipated d/c is to: Home              Anticipated d/c date is: > 3 days  Patient currently is not medically stable to d/c.  DVT Prophylaxis  :  Lovenox   Lab Results  Component Value Date   PLT 312 04/10/2020    Diet :  Diet Order            DIET - DYS 1 Room service appropriate? No; Fluid consistency: Nectar Thick  Diet effective now                  Inpatient Medications  Scheduled Meds: . albuterol  2 puff Inhalation TID  . vitamin C  500 mg Oral Daily  . atorvastatin  40 mg Oral Daily  . baricitinib  2 mg Oral Daily  . enoxaparin (LOVENOX) injection  60 mg Subcutaneous Q12H  . mouth rinse  15 mL Mouth Rinse BID  . methylPREDNISolone (SOLU-MEDROL) injection  60 mg Intravenous BID  . metoprolol tartrate  12.5 mg  Oral BID  . pantoprazole  40 mg Oral Daily  . zinc sulfate  220 mg Oral Daily   Continuous Infusions: . sodium chloride Stopped (04/09/20 2034)   PRN Meds:.sodium chloride, acetaminophen, chlorpheniramine-HYDROcodone, docusate sodium, [DISCONTINUED] ondansetron **OR** ondansetron (ZOFRAN) IV, polyethylene glycol, Resource ThickenUp Clear  Antibiotics  :    Anti-infectives (From admission, onward)   Start     Dose/Rate Route Frequency Ordered Stop   04/03/20 1000  Ampicillin-Sulbactam (UNASYN) 3 g in sodium chloride 0.9 % 100 mL IVPB        3 g 200 mL/hr over 30 Minutes Intravenous Every 6 hours 04/03/20 0935 04/09/20 1900   03/31/20 1000  remdesivir 100 mg in sodium chloride 0.9 % 100 mL IVPB       "Followed by" Linked Group Details   100 mg 200 mL/hr over 30 Minutes Intravenous Daily 03/30/20 1445 04/03/20 0917   03/30/20 1445  remdesivir 200 mg in sodium chloride 0.9% 250 mL IVPB       "Followed by" Linked Group Details   200 mg 580 mL/hr over 30 Minutes Intravenous Once 03/30/20 1445 03/30/20 1714       Time Spent in minutes  30   Huey Bienenstock M.D on 04/10/2020 at 11:48 AM  To page go to [email or web address]  [facility] -  Office  [phone]    See all Orders from today for further details    Objective:   Vitals:   04/10/20 0503 04/10/20 0557 04/10/20 0620 04/10/20 0737  BP:    128/72  Pulse: 78 70 72 80  Resp: (!) 21  Temp:    98.4 F (36.9 C)  TempSrc:    Oral  SpO2: 94% 92% 92% 90%  Weight:      Height:        Wt Readings from Last 3 Encounters:  04/10/20 60.9 kg  01/30/20 65.3 kg  12/05/19 66 kg     Intake/Output Summary (Last 24 hours) at 04/10/2020 1148 Last data filed at 04/10/2020 0943 Gross per 24 hour  Intake 860.17 ml  Output 1350 ml  Net -489.83 ml     Physical Exam  Weight, more conversant and appropriate awake, laying in bed, no apparent distress, confused , Symmetrical Chest wall movement, Good air movement  bilaterally, CTAB RRR,No Gallops,Rubs or new Murmurs, No Parasternal Heave +ve B.Sounds, Abd Soft, No tenderness, No rebound - guarding or rigidity. No Cyanosis, Clubbing or edema, No new Rash or bruise        Data Review:    CBC Recent Labs  Lab 04/04/20 [phone]  04/04/20 8416 04/06/20 0255 04/07/20 0323 04/08/20 0557 04/09/20 0701 04/10/20 0254  WBC 12.9*   < > 15.1* 15.6* 17.5* 14.5* 19.6*  HGB 13.6   < > 13.4 13.1 13.2 11.9* 12.2  HCT 43.2   < > 42.8 42.3 42.1 37.8 38.4  PLT 261   < > 311 297 312 286 312  MCV 92.5   < > 94.1 94.0 94.4 93.3 93.4  MCH 29.1   < > 29.5 29.1 29.6 29.4 29.7  MCHC 31.5   < > 31.3 31.0 31.4 31.5 31.8  RDW 14.2   < > 14.2 14.0 13.8 13.8 13.8  LYMPHSABS 0.9  --  0.5* 0.9 1.3 1.3  --   MONOABS 0.7  --  0.8 0.6 0.7 0.8  --   EOSABS 0.0  --  0.0 0.0 0.0 0.0  --   BASOSABS 0.0  --  0.0 0.0 0.0 0.0  --    < > = values in this interval not displayed.    Chemistries  Recent Labs  Lab 04/05/20 0745 04/05/20 0745 04/06/20 0255 04/07/20 0323 04/08/20 0557 04/09/20 0701 04/10/20 0254  NA 147*   < > 145 148* 143 141 137  K 4.0   < > 3.7 4.7 4.7 4.6 4.5  CL 101   < > 100 104 103 106 102  CO2 31   < > 29 34* 28 26 24   GLUCOSE 157*   < > 356* 181* 174* 176* 227*  BUN 30*   < > 28* 17 19 18 18   CREATININE 0.77   < > 0.94 0.74 0.75 0.69 0.58  CALCIUM 8.9   < > 9.0 8.9 8.7* 8.3* 8.6*  AST  --   --  19 19 20 19 18   ALT  --   --  19 19 21 22 24   ALKPHOS  --   --  57 53 52 48 48  BILITOT  --   --  0.3 0.7 0.8 0.6 0.5  MG 2.6*  --  2.5* 2.5* 2.4 2.3  --    < > = values in this interval not displayed.     ------------------------------------------------------------------------------------------------------------------ No results for input(s): CHOL, HDL, LDLCALC, TRIG, CHOLHDL, LDLDIRECT in the last 72 hours.  Lab Results  Component Value Date   HGBA1C 5.3 04/15/2017    ------------------------------------------------------------------------------------------------------------------ No results for input(s): TSH, T4TOTAL, T3FREE, THYROIDAB in the last 72 hours.  Invalid input(s): FREET3  Cardiac Enzymes No results for input(s): CKMB, TROPONINI, MYOGLOBIN in the last 168 hours.  Invalid input(s): CK ------------------------------------------------------------------------------------------------------------------    Component Value Date/Time   BNP 46.1 04/09/2020 0701    Micro Results No results found for this or any previous visit (from the past 240 hour(s)).  Radiology Reports DG Chest Port 1 View  Result Date: 04/07/2020 CLINICAL DATA:  Shortness of breath.  Reported COVID-19 positive. EXAM: PORTABLE CHEST 1 VIEW COMPARISON:  April 03, 2020 FINDINGS: There is persistent airspace consolidation in the left base with small left pleural effusion. There is slightly less airspace consolidation in the right base compared to recent study. There is ill-defined opacity in the right mid lung, stable. More subtle ill-defined opacity is noted in the left mid lung. Heart size and pulmonary vascular normal. No adenopathy. There is aortic atherosclerosis. No bone lesions. IMPRESSION: Areas of multifocal pneumonia with consolidation greatest in the left base region. Small left pleural effusion. Suspect atypical organism pneumonia as most likely etiology. Superimposed bacterial pneumonia in the left base is  possible. Stable cardiac silhouette. Aortic Atherosclerosis (ICD10-I70.0). Electronically Signed   By: Bretta Bang III M.D.   On: 04/07/2020 08:08   DG Chest Port 1 View  Result Date: 04/03/2020 CLINICAL DATA:  History of COVID infection EXAM: PORTABLE CHEST 1 VIEW COMPARISON:  April 02, 2020 FINDINGS: Images rotated to the LEFT. Accounting for this rotation cardiomediastinal contours are stable. Increasing opacity at the LEFT lung base compared to the prior  study. Also with increased opacity at the RIGHT lung base. Air bronchograms are seen at the LEFT lung base on today's study as well. Exam is more similar to the study of August 15. Probable small LEFT effusion as before. On limited assessment skeletal structures are unremarkable. IMPRESSION: Bilateral opacities worse at the lung bases and worse in the LEFT lung base, similar to prior imaging in this patient with reported history of COVID-19 pneumonia. Electronically Signed   By: Donzetta Kohut M.D.   On: 04/03/2020 07:54   DG Chest Port 1 View  Result Date: 04/02/2020 CLINICAL DATA:  Acute respiratory failure with hypoxemia, COVID-19 EXAM: PORTABLE CHEST 1 VIEW COMPARISON:  Portable exam 0652 hours compared 03/30/2020 FINDINGS: Normal heart size, mediastinal contours, and pulmonary vascularity. Atherosclerotic calcification aorta. Scattered pulmonary infiltrates bilaterally consistent with multifocal pneumonia most confluent in the LEFT lower lobe. No pleural effusion or pneumothorax. Mild osseous demineralization. IMPRESSION: Persistent pulmonary infiltrates consistent with multifocal pneumonia and history of COVID-19, slightly improved. Electronically Signed   By: Ulyses Southward M.D.   On: 04/02/2020 08:34   DG Chest Port 1 View  Result Date: 03/30/2020 CLINICAL DATA:  Patient with history of COVID-19. Worsening shortness of breath. EXAM: PORTABLE CHEST 1 VIEW COMPARISON:  None. FINDINGS: Monitoring leads overlie the patient. Enlarged cardiac and mediastinal contours. Diffuse bilateral airspace opacities. Probable small left pleural effusion. No pneumothorax. IMPRESSION: 1. Cardiomegaly. 2. Diffuse bilateral airspace opacities most compatible with multifocal pneumonia. Electronically Signed   By: Annia Belt M.D.   On: 03/30/2020 12:51   VAS [place] LOWER EXTREMITY VENOUS (DVT)  Result Date: 04/02/2020  Lower Venous DVTStudy Other Indications: Covid positive with elevated d-dimer. Performing Technologist: Marilynne Halsted RDMS, RVT  Examination Guidelines: A complete evaluation includes B-mode imaging, spectral Doppler, color Doppler, and power Doppler as needed of all accessible portions of each vessel. Bilateral testing is considered an integral part of a complete examination. Limited examinations for reoccurring indications may be performed as noted. The reflux portion of the exam is performed with the patient in reverse Trendelenburg.  +---------+---------------+---------+-----------+----------+--------------+ RIGHT    CompressibilityPhasicitySpontaneityPropertiesThrombus Aging +---------+---------------+---------+-----------+----------+--------------+ CFV      Full           Yes      Yes                                 +---------+---------------+---------+-----------+----------+--------------+ SFJ      Full                                                        +---------+---------------+---------+-----------+----------+--------------+ FV Prox  Full                                                        +---------+---------------+---------+-----------+----------+--------------+  FV Mid   Full                                                        +---------+---------------+---------+-----------+----------+--------------+ FV DistalFull                                                        +---------+---------------+---------+-----------+----------+--------------+ PFV      Full                                                        +---------+---------------+---------+-----------+----------+--------------+ POP      Full           Yes      Yes                                 +---------+---------------+---------+-----------+----------+--------------+ PTV      Full                                                        +---------+---------------+---------+-----------+----------+--------------+ PERO     Full                                                         +---------+---------------+---------+-----------+----------+--------------+   +---------+---------------+---------+-----------+----------+--------------+ LEFT     CompressibilityPhasicitySpontaneityPropertiesThrombus Aging +---------+---------------+---------+-----------+----------+--------------+ CFV      Full           Yes      Yes                                 +---------+---------------+---------+-----------+----------+--------------+ SFJ      Full                                                        +---------+---------------+---------+-----------+----------+--------------+ FV Prox  Full                                                        +---------+---------------+---------+-----------+----------+--------------+ FV Mid   Full                                                        +---------+---------------+---------+-----------+----------+--------------+  FV DistalFull                                                        +---------+---------------+---------+-----------+----------+--------------+ PFV      Full                                                        +---------+---------------+---------+-----------+----------+--------------+ POP      Full           Yes      Yes                                 +---------+---------------+---------+-----------+----------+--------------+ PTV      Full                                                        +---------+---------------+---------+-----------+----------+--------------+ PERO     Full                                                        +---------+---------------+---------+-----------+----------+--------------+     Summary: BILATERAL: - No evidence of deep vein thrombosis seen in the lower extremities, bilaterally. -No evidence of popliteal cyst, bilaterally.   *See table(s) above for measurements and observations. Electronically signed by Gretta Began MD on 04/02/2020 at  5:04:02 PM.    Final    ECHOCARDIOGRAM LIMITED  Result Date: 03/31/2020    ECHOCARDIOGRAM LIMITED REPORT   Patient Name:   KYAIRA TRANTHAM Date of Exam: 03/31/2020 Medical Rec #:  [id number]     Height:       62.0 in Accession #:    [id number]    Weight:       142.2 lb Date of Birth:  February 26, 1951     BSA:          1.654 m Patient Age:    68 years      BP:           136/80 mmHg Patient Gender: F             HR:           86 bpm. Exam Location:  Inpatient Procedure: Limited Echo, Color Doppler and Cardiac Doppler Indications:    R06.9 DOE  History:        Patient has no prior history of Echocardiogram examinations.                 Risk Factors:Dyslipidemia.  Sonographer:    Irving Burton Senior RDCS Referring Phys: Humberto Seals  Sonographer Comments: COVID+ at time of study IMPRESSIONS  1. Abnormal septal motion with distal hypokinesis . Left ventricular ejection fraction, by estimation, is 50 to 55%. The left ventricle has low normal function. Left ventricular endocardial border not optimally defined to  evaluate regional wall motion. Left ventricular diastolic parameters are indeterminate.  2. The mitral valve is normal in structure. Mild mitral valve regurgitation.  3. The aortic valve is tricuspid. Aortic valve regurgitation is not visualized. Mild aortic valve sclerosis is present, with no evidence of aortic valve stenosis.  4. There is normal pulmonary artery systolic pressure.  5. The inferior vena cava is normal in size with greater than 50% respiratory variability, suggesting right atrial pressure of 3 mmHg. FINDINGS  Left Ventricle: Abnormal septal motion with distal hypokinesis. Left ventricular ejection fraction, by estimation, is 50 to 55%. The left ventricle has low normal function. Left ventricular endocardial border not optimally defined to evaluate regional wall motion. The left ventricular internal cavity size was normal in size. Right Ventricle: There is normal pulmonary artery systolic pressure.  The tricuspid regurgitant velocity is 2.33 m/s, and with an assumed right atrial pressure of 3 mmHg, the estimated right ventricular systolic pressure is 24.7 mmHg. Mitral Valve: The mitral valve is normal in structure. There is mild thickening of the mitral valve leaflet(s). There is mild calcification of the mitral valve leaflet(s). Mild mitral annular calcification. Mild mitral valve regurgitation. Tricuspid Valve: The tricuspid valve is normal in structure. Tricuspid valve regurgitation is mild. Aortic Valve: The aortic valve is tricuspid. Aortic valve regurgitation is not visualized. Mild aortic valve sclerosis is present, with no evidence of aortic valve stenosis. Venous: The inferior vena cava is normal in size with greater than 50% respiratory variability, suggesting right atrial pressure of 3 mmHg. IAS/Shunts: The interatrial septum was not well visualized. RIGHT VENTRICLE RV S prime:     10.80 cm/s TAPSE (M-mode): 1.7 cm AORTIC VALVE LVOT Vmax:   95.30 cm/s LVOT Vmean:  65.900 cm/s LVOT VTI:    0.180 m MITRAL VALVE               TRICUSPID VALVE MV Area (PHT): 4.21 cm    TR Peak grad:   21.7 mmHg MV Decel Time: 180 msec    TR Vmax:        233.00 cm/s MV E velocity: 57.70 cm/s MV A velocity: 73.20 cm/s  SHUNTS MV E/A ratio:  0.79        Systemic VTI: 0.18 m Charlton Haws MD Electronically signed by Charlton Haws MD Signature Date/Time: 03/31/2020/3:13:28 PM    Final

## 2020-04-10 NOTE — Evaluation (Addendum)
Occupational Therapy Evaluation Patient Details Name: Christina Russell MRN: [id number] DOB: [birth year] Today's Date: 04/10/2020    History of Present Illness 69 year old female who is not vaccinated for Covid and has underlying history of dementia, dyslipidemia who presented to the hospital with shortness of breath.  She was diagnosed with acute hypoxic respiratory failure due to COVID-19 pneumonia.   Clinical Impression   Pt admitted with above diagnoses, presenting with congitive deficits, generalized weakness and high SpO2 demands. Unsure of PLOF due to pt being poor historian. Pt completed supine <> sit with mod A +2 and was able to maintain sitting 2-3 minutes before desaturating to 74% SpO2 on 25L HHFNC. Pt began to panic, with increased rate of breathing and fighting to lay back down in bed. Pt required increased assist to get back in bed and NRB was applied. With NRB and rest, sats went back up to 92%. Pt able to have NRB removed. RT in room at end of session to address SpO2 needs. Given current status recommending SNF. OT will continue to follow as available and appropriate to continue POC.   Of note: Pt required 25L HHFNC +15L NRB for bed mobility     SNF    Equipment Recommendations  Wheelchair (measurements OT);Wheelchair cushion (measurements OT);3 in 1 bedside commode (hoyer lift and lift pads)    Recommendations for Other Services       Precautions / Restrictions Precautions Precautions: Fall;Other (comment) Precaution Comments: watch sats      Mobility Bed Mobility Overal bed mobility: Needs Assistance Bed Mobility: Supine to Sit;Rolling Rolling: Min assist   Supine to sit: +2 for physical assistance;Mod assist     General bed mobility comments: +rail, cues for sequencing, assist with BLE and trunk  Transfers                 General transfer comment: unable to progress beyond EOB due to desat    Balance Overall balance assessment: Needs  assistance Sitting-balance support: Feet unsupported;Bilateral upper extremity supported Sitting balance-Leahy Scale: Poor Sitting balance - Comments: reliant on external support                                   ADL either performed or assessed with clinical judgement   ADL                                         General ADL Comments: Pt is currently total A for all BADLs at this time. Difficulty following commands when seated EOB due to anxiety and baseline cognitive deficits     Vision Baseline Vision/History: Wears glasses Wears Glasses: At all times Patient Visual Report: No change from baseline       Perception     Praxis      Pertinent Vitals/Pain Pain Assessment: Faces Faces Pain Scale: No hurt     Hand Dominance     Extremity/Trunk Assessment Upper Extremity Assessment Upper Extremity Assessment: Generalized weakness   Lower Extremity Assessment Lower Extremity Assessment: Generalized weakness   Cervical / Trunk Assessment Cervical / Trunk Assessment: Kyphotic   Communication Communication Communication: No difficulties (often hard to understand)   Cognition Arousal/Alertness: Awake/alert Behavior During Therapy: WFL for tasks assessed/performed Overall Cognitive Status: No family/caregiver present to determine baseline cognitive functioning  General Comments: Dementia at baseline. Pt very pleasant, cooperative and grateful. Following all commands. Disoriented to time, place and situation.   General Comments  Pt on 25L HHFNC with SpO2 93% at rest. Desat to 74% sitting EOB requiring return to supine. Addition of 15L NRB required to recover back to 94%. NRB removed. Pt able to sustain SpO2 > 90% on 25L.    Exercises     Shoulder Instructions      Home Living Family/patient expects to be discharged to:: Skilled nursing facility                                         Prior Functioning/Environment          Comments: Unsure of prior level. Pt is a poor historian. Family unavailable.        OT Problem List: Decreased strength;Decreased knowledge of use of DME or AE;Decreased knowledge of precautions;Decreased activity tolerance;Decreased cognition;Cardiopulmonary status limiting activity;Impaired balance (sitting and/or standing)      OT Treatment/Interventions: Self-care/ADL training;Therapeutic exercise;Patient/family education;Balance training;Energy conservation;Therapeutic activities;DME and/or AE instruction    OT Goals(Current goals can be found in the care plan section) Acute Rehab OT Goals Patient Stated Goal: not stated OT Goal Formulation: Patient unable to participate in goal setting Time For Goal Achievement: 04/24/20 Potential to Achieve Goals: Good  OT Frequency: Min 2X/week   Barriers to D/C:            Co-evaluation PT/OT/SLP Co-Evaluation/Treatment: Yes Reason for Co-Treatment: For patient/therapist safety;To address functional/ADL transfers;Necessary to address cognition/behavior during functional activity PT goals addressed during session: Mobility/safety with mobility;Balance OT goals addressed during session: ADL's and self-care      AM-PAC OT "6 Clicks" Daily Activity     Outcome Measure Help from another person eating meals?: Total Help from another person taking care of personal grooming?: Total Help from another person toileting, which includes using toliet, bedpan, or urinal?: Total Help from another person bathing (including washing, rinsing, drying)?: Total Help from another person to put on and taking off regular upper body clothing?: Total Help from another person to put on and taking off regular lower body clothing?: Total 6 Click Score: 6   End of Session Equipment Utilized During Treatment: Oxygen (HHFNC; NRB) Nurse Communication: Mobility status  Activity Tolerance: Treatment limited  secondary to medical complications (Comment) (desat) Patient left: in bed;with call bell/phone within reach  OT Visit Diagnosis: Unsteadiness on feet (R26.81);Other abnormalities of gait and mobility (R26.89);Muscle weakness (generalized) (M62.81);Other symptoms and signs involving cognitive function                Time: 1045-1108 OT Time Calculation (min): 23 min Charges:  OT General Charges $OT Visit: 1 Visit OT Evaluation $OT Eval Moderate Complexity: 1 Mod  Dalphine Handing, MSOT, OTR/L Acute Rehabilitation Services [facility] Office Number: [phone] Pager: [phone]  Dalphine Handing 04/10/2020, 2:09 PM

## 2020-04-10 NOTE — Plan of Care (Signed)
  Problem: Clinical Measurements: Goal: Ability to maintain clinical measurements within normal limits will improve Outcome: Progressing Goal: Will remain free from infection Outcome: Progressing Goal: Diagnostic test results will improve Outcome: Progressing Goal: Respiratory complications will improve Outcome: Progressing Goal: Cardiovascular complication will be avoided Outcome: Progressing   Problem: Nutrition: Goal: Adequate nutrition will be maintained Outcome: Progressing   Problem: Elimination: Goal: Will not experience complications related to bowel motility Outcome: Progressing Goal: Will not experience complications related to urinary retention Outcome: Progressing   Problem: Respiratory: Goal: Will maintain a patent airway Outcome: Progressing Goal: Complications related to the disease process, condition or treatment will be avoided or minimized Outcome: Progressing   Problem: Education: Goal: Knowledge of General Education information will improve Description: Including pain rating scale, medication(s)/side effects and non-pharmacologic comfort measures Outcome: Not Progressing   Problem: Health Behavior/Discharge Planning: Goal: Ability to manage health-related needs will improve Outcome: Not Progressing   Problem: Activity: Goal: Risk for activity intolerance will decrease Outcome: Not Progressing   Problem: Coping: Goal: Level of anxiety will decrease Outcome: Not Progressing   Problem: Education: Goal: Knowledge of risk factors and measures for prevention of condition will improve Outcome: Not Progressing   Problem: Coping: Goal: Psychosocial and spiritual needs will be supported Outcome: Not Progressing   

## 2020-04-10 NOTE — Evaluation (Signed)
Physical Therapy Evaluation Patient Details Name: Christina Russell MRN: [id number] DOB: Jul 09, 1951 Today's Date: 04/10/2020   History of Present Illness  69 year old African-American female who is not vaccinated for Covid and has underlying history of dementia, dyslipidemia who presented to the hospital with shortness of breath.  She was diagnosed with acute hypoxic respiratory failure due to COVID-19 pneumonia.    Clinical Impression  Pt admitted with above diagnosis. On eval, pt required min assist rolling and +2 mod assist supine to sit. She demonstrated poor sitting balance. Pt tolerated sitting EOB approx 2-3 minutes. Return to supine required due to desat to 74% on 25L HHFNC. After return to supine, addition of 15L NRB required to recover to 94%. NRB removed with pt again maintaining SpO2 >90% on 25L. During session, pt with c/o being cold. Pt provided with warm blankets at end of session. Pt currently with functional limitations due to the deficits listed below (see PT Problem List). Pt will benefit from skilled PT to increase their independence and safety with mobility to allow discharge to the venue listed below.       Follow Up Recommendations SNF    Equipment Recommendations  Other (comment) (TBD)    Recommendations for Other Services       Precautions / Restrictions Precautions Precautions: Fall;Other (comment) Precaution Comments: watch sats      Mobility  Bed Mobility Overal bed mobility: Needs Assistance Bed Mobility: Supine to Sit;Rolling Rolling: Min assist   Supine to sit: +2 for physical assistance;Mod assist     General bed mobility comments: +rail, cues for sequencing, assist with BLE and trunk  Transfers                 General transfer comment: unable to progress beyond EOB due to desat  Ambulation/Gait             General Gait Details: unable  Stairs            Wheelchair Mobility    Modified Rankin (Stroke Patients Only)        Balance Overall balance assessment: Needs assistance Sitting-balance support: Feet unsupported;Bilateral upper extremity supported Sitting balance-Leahy Scale: Poor Sitting balance - Comments: reliant on external support                                     Pertinent Vitals/Pain Pain Assessment: Faces Faces Pain Scale: No hurt    Home Living Family/patient expects to be discharged to:: Skilled nursing facility                      Prior Function           Comments: Unsure of prior level. Pt is a poor historian. Family unavailable.     Hand Dominance        Extremity/Trunk Assessment   Upper Extremity Assessment Upper Extremity Assessment: Generalized weakness    Lower Extremity Assessment Lower Extremity Assessment: Generalized weakness    Cervical / Trunk Assessment Cervical / Trunk Assessment: Kyphotic  Communication   Communication: No difficulties  Cognition Arousal/Alertness: Awake/alert Behavior During Therapy: WFL for tasks assessed/performed Overall Cognitive Status: No family/caregiver present to determine baseline cognitive functioning                                 General Comments: Dementia at baseline. Pt very  pleasant, cooperative and grateful. Following all commands. Disoriented to time, place and situation.      General Comments General comments (skin integrity, edema, etc.): Pt on 25L HHFNC with SpO2 93% at rest. Desat to 74% sitting EOB requiring return to supine. Addition of 15L NRB required to recover back to 94%. NRB removed. Pt able to sustain SpO2 > 90% on 25L.    Exercises     Assessment/Plan    PT Assessment Patient needs continued PT services  PT Problem List Decreased strength;Decreased mobility;Decreased safety awareness;Decreased knowledge of precautions;Decreased activity tolerance;Decreased balance;Cardiopulmonary status limiting activity       PT Treatment Interventions      PT  Goals (Current goals can be found in the Care Plan section)  Acute Rehab PT Goals Patient Stated Goal: not stated PT Goal Formulation: Patient unable to participate in goal setting Time For Goal Achievement: 04/24/20 Potential to Achieve Goals: Fair    Frequency Min 2X/week   Barriers to discharge        Co-evaluation PT/OT/SLP Co-Evaluation/Treatment: Yes Reason for Co-Treatment: Complexity of the patient's impairments (multi-system involvement);For patient/therapist safety;Necessary to address cognition/behavior during functional activity PT goals addressed during session: Mobility/safety with mobility;Balance         AM-PAC PT "6 Clicks" Mobility  Outcome Measure Help needed turning from your back to your side while in a flat bed without using bedrails?: A Little Help needed moving from lying on your back to sitting on the side of a flat bed without using bedrails?: A Lot Help needed moving to and from a bed to a chair (including a wheelchair)?: Total Help needed standing up from a chair using your arms (e.g., wheelchair or bedside chair)?: Total Help needed to walk in hospital room?: Total Help needed climbing 3-5 steps with a railing? : Total 6 Click Score: 9    End of Session Equipment Utilized During Treatment: Oxygen Activity Tolerance: Treatment limited secondary to medical complications (Comment) (desat) Patient left: in bed;with call bell/phone within reach;with bed alarm set Nurse Communication: Mobility status PT Visit Diagnosis: Other abnormalities of gait and mobility (R26.89);Muscle weakness (generalized) (M62.81)    Time: 6384-6659 PT Time Calculation (min) (ACUTE ONLY): 27 min   Charges:   PT Evaluation $PT Eval Moderate Complexity: 1 Mod          Aida Raider, PT  Office # [phone] Pager [phone]   Ilda Foil 04/10/2020, 12:45 PM

## 2020-04-11 LAB — COMPREHENSIVE METABOLIC PANEL
ALT: 21 U/L (ref 0–44)
AST: 16 U/L (ref 15–41)
Albumin: 2 g/dL — ABNORMAL LOW (ref 3.5–5.0)
Alkaline Phosphatase: 50 U/L (ref 38–126)
Anion gap: 7 (ref 5–15)
BUN: 19 mg/dL (ref 8–23)
CO2: 28 mmol/L (ref 22–32)
Calcium: 8.5 mg/dL — ABNORMAL LOW (ref 8.9–10.3)
Chloride: 103 mmol/L (ref 98–111)
Creatinine, Ser: 0.63 mg/dL (ref 0.44–1.00)
GFR calc Af Amer: >60 mL/min (ref >60)
GFR calc non Af Amer: >60 mL/min (ref >60)
Glucose, Bld: 173 mg/dL — ABNORMAL HIGH (ref 70–99)
Potassium: 4.5 mmol/L (ref 3.5–5.1)
Sodium: 138 mmol/L (ref 135–145)
Total Bilirubin: 0.8 mg/dL (ref 0.3–1.2)
Total Protein: 6.3 g/dL — ABNORMAL LOW (ref 6.5–8.1)

## 2020-04-11 LAB — CBC
HCT: 39.8 % (ref 36.0–46.0)
Hemoglobin: 12.8 g/dL (ref 12.0–15.0)
MCH: 29.6 pg (ref 26.0–34.0)
MCHC: 32.2 g/dL (ref 30.0–36.0)
MCV: 92.1 fL (ref 80.0–100.0)
Platelets: 303 10*3/uL (ref 150–400)
RBC: 4.32 MIL/uL (ref 3.87–5.11)
RDW: 13.7 % (ref 11.5–15.5)
WBC: 16.8 10*3/uL — ABNORMAL HIGH (ref 4.0–10.5)
nRBC: 0.1 % (ref 0.0–0.2)

## 2020-04-11 LAB — D-DIMER, QUANTITATIVE: D-Dimer, Quant: 3.16 ug/mL-FEU — ABNORMAL HIGH (ref 0.00–0.50)

## 2020-04-11 NOTE — Progress Notes (Signed)
  Speech Language Pathology Treatment: Dysphagia  Patient Details Name: Christina Russell MRN: [id number] DOB: 22-May-1951 Today's Date: 04/11/2020 Time: 0630-1601 SLP Time Calculation (min) (ACUTE ONLY): 14 min  Assessment / Plan / Recommendation Clinical Impression  Dysphagia treatment included observation, modification and trials of thin water. Pleasant this morning, in no distress and appears calmer. She is on HFNC 25 L at 80%. Respiration stable with nectar thick trials needing verbal cues pace herself for respiration breaks during drinking. No s/s aspiration with nectar. Increased work of breathing following trials thin water and delayed throat clear concerning for possible airway intrusion. Recommend pt continue nectar and puree at present. Will continue treatment with MBS if needed for determination to advance.    HPI HPI: 69 year old African-American female unvaccinated with history of dementia, dyslipidemia who presented to the hospital with shortness of breath.  She was diagnosed with acute hypoxic respiratory failure due to COVID-19 pneumonia and admitted to ICU on heated high flow. Per MD note has developed some CHF.        SLP Plan  Continue with current plan of care       Recommendations  Diet recommendations: Dysphagia 1 (puree);Nectar-thick liquid Liquids provided via: Cup;No straw Medication Administration: Crushed with puree Supervision: Staff to assist with self feeding;Full supervision/cueing for compensatory strategies Compensations: Slow rate;Minimize environmental distractions;Small sips/bites;Lingual sweep for clearance of pocketing Postural Changes and/or Swallow Maneuvers: Seated upright 90 degrees                Oral Care Recommendations: Oral care BID Follow up Recommendations: Skilled Nursing facility;24 hour supervision/assistance SLP Visit Diagnosis: Dysphagia, unspecified (R13.10) Plan: Continue with current plan of care                        Royce Macadamia 04/11/2020, 10:19 AM  Breck Coons Lonell Face.Ed Nurse, children's [phone] Office [phone]

## 2020-04-11 NOTE — TOC Progression Note (Signed)
Transition of Care [facility]) - Progression Note    Patient Details  Name: Debi Cousin MRN: [id number] Date of Birth: 02-May-1951  Transition of Care [facility]) CM/SW Contact  Lockie Pares, RN Phone Number: 04/11/2020, 3:10 PM  Clinical Narrative:     Patient continues in guarded condition on high flow oxygen 25L 80%  Will likely continue deconditioning requiring SNF., which is recommended by PT,OT. Daughter currently admitted in hospital unable to take care of patient. Palliative care also consulted  For goals of care.   Expected Discharge Plan: Home w Home Health Services Barriers to Discharge: Continued Medical Work up  Expected Discharge Plan and Services Expected Discharge Plan: Home w Home Health Services   Discharge Planning Services: CM Consult                                           Social Determinants of Health (SDOH) Interventions    Readmission Risk Interventions No flowsheet data found.

## 2020-04-11 NOTE — Progress Notes (Signed)
PROGRESS NOTE                                                                                                                                                                                                             Patient Demographics:    Christina Russell, is a 69 y.o. female, DOB - Nov 18, 1950, AVW:[id number]  Outpatient Primary MD for the patient is [name], MD    LOS - 12  Admit date - 03/30/2020    Chief Complaint  Patient presents with  . Covid/ Distress  . COVID positive       Brief Narrative 69 year old African-American female who is not vaccinated for Covid and has underlying history of dementia, dyslipidemia who presented to the hospital with shortness of breath.  She was diagnosed with acute hypoxic respiratory failure due to COVID-19 pneumonia and admitted to ICU on heated high flow.  She also developed some CHF.  She was stabilized and subsequently transferred to my service on 04/02/2020.   Subjective:   Patient remains confused, unreliable historian, no significant events as discussed with staff .   Assessment  & Plan :    Acute Hypoxic Resp. Failure due to Acute Covid 19 Viral Pneumonitis during the ongoing 2020 Covid 19 Pandemic -  - unfortunately she is unvaccinated  - Has severe parenchymal injury,  -She is treated with IV steroids . -Was treated with IV remdesivir . -He is been treated with  Baricitinib. -He remains with significant oxygen requirement, initially requiring on HHFL 55 Lit, this has improved, she is currently on 25 L heated high flow nasal cannula, this remains significant . -She treated for aspiration pneumonia, 7 days of IV Unasyn . - She has very guarded diagnosis, she is currently DNR., she is DNR if declines further comfort measures.  Palliative care also consulted and following.  Encouraged the patient to sit up in chair in the daytime use I-S and flutter valve for pulmonary  toiletry and then prone in bed when at night.  Will advance activity and titrate down oxygen as possible.    SpO2: 92 % O2 Flow Rate (L/min): 25 L/min FiO2 (%): 80 %    Recent Labs  Lab 04/05/20 0658 04/05/20 0745 04/06/20 0255 04/06/20 0255 04/07/20 0323 04/08/20 0557 04/09/20 0701 04/10/20 0254 04/11/20 0119  WBC  --   --  15.1*   < > 15.6* 17.5* 14.5* 19.6* 16.8*  CRP  --  11.9* 7.8*  --  6.1* 3.3* 1.4*  --   --   DDIMER  --   --  8.05*   < > 3.50* 3.66* 3.30* 3.27* 3.16*  BNP 50.7  --  57.0  --  45.4 42.7 46.1  --   --   PROCALCITON <0.10  --  <0.10  --  <0.10 <0.10 <0.10  --   --   AST  --   --  19   < > 19 20 19 18 16   ALT  --   --  19   < > 19 21 22 24 21   ALKPHOS  --   --  57   < > 53 52 48 48 50  BILITOT  --   --  0.3   < > 0.7 0.8 0.6 0.5 0.8  ALBUMIN  --   --  2.3*   < > 2.0* 2.1* 1.9* 1.9* 2.0*   < > = values in this interval not displayed.    Aspiration pneumonia -Treated with IV unasyn -Followed by SLP, she remains on dysphagia 1 with nectar thick liquid  Extremely high D dimer - high risk for a clot -ve Leg [place], stable R Atrial pressure on TTE, difficulty trending down, it is 3.2 today,  decreased her Lovenox to 0.5 mg/kg every 12 hours.  Underlying history of dementia.  At high risk for delirium, minimize narcotics and benzodiazepines, daughter has been counseled.  Currently does have mild toxic and metabolic encephalopathy on top of her dementia.  Dyslipidemia.  Continue home dose statin.  Acute on chronic diastolic heart failure with EF 50 to 55%. Outpatient cardiology follow-up for some distal septal hypokinesis, continue combination of aspirin, statin and beta-blocker for secondary prevention for now.  Diuresis on hold given some hyponatremia and evidence of volume depletion on echo.   Hypernatremia -resolved with D5W  Possible aspiration pneumonia.  Speech following, on dysphagia 1 diet with nectar thick liquids, feeding assistance aspiration  precautions, Unasyn.  MRSA nasal swab negative.     Condition - Extremely Guarded ++  Family Communication  : Daughter is currently hospitalized, she has been updated daily, and updated granddaughter by phone 8/26.  Code Status : DNR  Consults  :  PCCM  Procedures  :    Leg [place] - No DVT  TTE - 1. Abnormal septal motion with distal hypokinesis . Left ventricular ejection fraction, by estimation, is 50 to 55%. The left ventricle has low normal function. Left ventricular endocardial border not optimally defined to evaluate regional wall motion. Left ventricular diastolic parameters are indeterminate.  2. The mitral valve is normal in structure. Mild mitral valve regurgitation.  3. The aortic valve is tricuspid. Aortic valve regurgitation is not visualized. Mild aortic valve sclerosis is present, with no evidence of aortic valve stenosis.  4. There is normal pulmonary artery systolic pressure.  5. The inferior vena cava is normal in size with greater than 50% respiratory variability, suggesting right atrial pressure of 3 mmHg.   PUD Prophylaxis :  PPI  Disposition Plan  :    Status is: Inpatient  Remains inpatient appropriate because:IV treatments appropriate due to intensity of illness or inability to take PO   Dispo: The patient is from: Home              Anticipated d/c is to: Home  Anticipated d/c date is: > 3 days              Patient currently is not medically stable to d/c.  DVT Prophylaxis  :  Lovenox   Lab Results  Component Value Date   PLT 303 04/11/2020    Diet :  Diet Order            DIET - DYS 1 Room service appropriate? No; Fluid consistency: Nectar Thick  Diet effective now                  Inpatient Medications  Scheduled Meds: . albuterol  2 puff Inhalation TID  . vitamin C  500 mg Oral Daily  . atorvastatin  40 mg Oral Daily  . baricitinib  2 mg Oral Daily  . enoxaparin (LOVENOX) injection  30 mg Subcutaneous Q12H  . mouth rinse  15  mL Mouth Rinse BID  . methylPREDNISolone (SOLU-MEDROL) injection  40 mg Intravenous BID  . metoprolol tartrate  12.5 mg Oral BID  . pantoprazole  40 mg Oral Daily  . zinc sulfate  220 mg Oral Daily   Continuous Infusions: . sodium chloride Stopped (04/09/20 2034)   PRN Meds:.sodium chloride, acetaminophen, chlorpheniramine-HYDROcodone, docusate sodium, [DISCONTINUED] ondansetron **OR** ondansetron (ZOFRAN) IV, polyethylene glycol, Resource ThickenUp Clear  Antibiotics  :    Anti-infectives (From admission, onward)   Start     Dose/Rate Route Frequency Ordered Stop   04/03/20 1000  Ampicillin-Sulbactam (UNASYN) 3 g in sodium chloride 0.9 % 100 mL IVPB        3 g 200 mL/hr over 30 Minutes Intravenous Every 6 hours 04/03/20 0935 04/09/20 1900   03/31/20 1000  remdesivir 100 mg in sodium chloride 0.9 % 100 mL IVPB       "Followed by" Linked Group Details   100 mg 200 mL/hr over 30 Minutes Intravenous Daily 03/30/20 1445 04/03/20 0917   03/30/20 1445  remdesivir 200 mg in sodium chloride 0.9% 250 mL IVPB       "Followed by" Linked Group Details   200 mg 580 mL/hr over 30 Minutes Intravenous Once 03/30/20 1445 03/30/20 1714       Time Spent in minutes  30   Huey Bienenstock M.D on 04/11/2020 at 12:18 PM  To page go to [email or web address]  [facility] -  Office  [phone]    See all Orders from today for further details    Objective:   Vitals:   04/11/20 0242 04/11/20 0334 04/11/20 0745 04/11/20 0815  BP:  106/69    Pulse: 70 68 88   Resp: (!) 25 (!) 21 (!) 28 20  Temp:  98.6 F (37 C)    TempSrc:  Axillary    SpO2: 90% 95% 92%   Weight:  62.2 kg    Height:        Wt Readings from Last 3 Encounters:  04/11/20 62.2 kg  01/30/20 65.3 kg  12/05/19 66 kg     Intake/Output Summary (Last 24 hours) at 04/11/2020 1218 Last data filed at 04/11/2020 0953 Gross per 24 hour  Intake 716 ml  Output 450 ml  Net 266 ml     Physical Exam  Awake, frail, conversant,  confused and demented but pleasant . Symmetrical Chest wall movement, Good air movement bilaterally, CTAB RRR,No Gallops,Rubs or new Murmurs, No Parasternal Heave +ve B.Sounds, Abd Soft, No tenderness, No rebound - guarding or rigidity. No Cyanosis, Clubbing or edema, No new Rash or bruise  Data Review:    CBC Recent Labs  Lab 04/06/20 0255 04/06/20 0255 04/07/20 0323 04/08/20 0557 04/09/20 0701 04/10/20 0254 04/11/20 0119  WBC 15.1*   < > 15.6* 17.5* 14.5* 19.6* 16.8*  HGB 13.4   < > 13.1 13.2 11.9* 12.2 12.8  HCT 42.8   < > 42.3 42.1 37.8 38.4 39.8  PLT 311   < > 297 312 286 312 303  MCV 94.1   < > 94.0 94.4 93.3 93.4 92.1  MCH 29.5   < > 29.1 29.6 29.4 29.7 29.6  MCHC 31.3   < > 31.0 31.4 31.5 31.8 32.2  RDW 14.2   < > 14.0 13.8 13.8 13.8 13.7  LYMPHSABS 0.5*  --  0.9 1.3 1.3  --   --   MONOABS 0.8  --  0.6 0.7 0.8  --   --   EOSABS 0.0  --  0.0 0.0 0.0  --   --   BASOSABS 0.0  --  0.0 0.0 0.0  --   --    < > = values in this interval not displayed.    Chemistries  Recent Labs  Lab 04/05/20 0745 04/05/20 0745 04/06/20 0255 04/06/20 0255 04/07/20 0323 04/08/20 0557 04/09/20 0701 04/10/20 0254 04/11/20 0119  NA 147*   < > 145   < > 148* 143 141 137 138  K 4.0   < > 3.7   < > 4.7 4.7 4.6 4.5 4.5  CL 101   < > 100   < > 104 103 106 102 103  CO2 31   < > 29   < > 34* 28 26 24 28   GLUCOSE 157*   < > 356*   < > 181* 174* 176* 227* 173*  BUN 30*   < > 28*   < > 17 19 18 18 19   CREATININE 0.77   < > 0.94   < > 0.74 0.75 0.69 0.58 0.63  CALCIUM 8.9   < > 9.0   < > 8.9 8.7* 8.3* 8.6* 8.5*  AST  --   --  19   < > 19 20 19 18 16   ALT  --   --  19   < > 19 21 22 24 21   ALKPHOS  --   --  57   < > 53 52 48 48 50  BILITOT  --   --  0.3   < > 0.7 0.8 0.6 0.5 0.8  MG 2.6*  --  2.5*  --  2.5* 2.4 2.3  --   --    < > = values in this interval not displayed.       ------------------------------------------------------------------------------------------------------------------ No results for input(s): CHOL, HDL, LDLCALC, TRIG, CHOLHDL, LDLDIRECT in the last 72 hours.  Lab Results  Component Value Date   HGBA1C 5.3 04/15/2017   ------------------------------------------------------------------------------------------------------------------ No results for input(s): TSH, T4TOTAL, T3FREE, THYROIDAB in the last 72 hours.  Invalid input(s): FREET3  Cardiac Enzymes No results for input(s): CKMB, TROPONINI, MYOGLOBIN in the last 168 hours.  Invalid input(s): CK ------------------------------------------------------------------------------------------------------------------    Component Value Date/Time   BNP 46.1 04/09/2020 0701    Micro Results No results found for this or any previous visit (from the past 240 hour(s)).  Radiology Reports DG Chest Port 1 View  Result Date: 04/07/2020 CLINICAL DATA:  Shortness of breath.  Reported COVID-19 positive. EXAM: PORTABLE CHEST 1 VIEW COMPARISON:  April 03, 2020 FINDINGS: There is persistent airspace consolidation in  the left base with small left pleural effusion. There is slightly less airspace consolidation in the right base compared to recent study. There is ill-defined opacity in the right mid lung, stable. More subtle ill-defined opacity is noted in the left mid lung. Heart size and pulmonary vascular normal. No adenopathy. There is aortic atherosclerosis. No bone lesions. IMPRESSION: Areas of multifocal pneumonia with consolidation greatest in the left base region. Small left pleural effusion. Suspect atypical organism pneumonia as most likely etiology. Superimposed bacterial pneumonia in the left base is possible. Stable cardiac silhouette. Aortic Atherosclerosis (ICD10-I70.0). Electronically Signed   By: Bretta Bang III M.D.   On: 04/07/2020 08:08   DG Chest Port 1 View  Result Date:  04/03/2020 CLINICAL DATA:  History of COVID infection EXAM: PORTABLE CHEST 1 VIEW COMPARISON:  April 02, 2020 FINDINGS: Images rotated to the LEFT. Accounting for this rotation cardiomediastinal contours are stable. Increasing opacity at the LEFT lung base compared to the prior study. Also with increased opacity at the RIGHT lung base. Air bronchograms are seen at the LEFT lung base on today's study as well. Exam is more similar to the study of August 15. Probable small LEFT effusion as before. On limited assessment skeletal structures are unremarkable. IMPRESSION: Bilateral opacities worse at the lung bases and worse in the LEFT lung base, similar to prior imaging in this patient with reported history of COVID-19 pneumonia. Electronically Signed   By: Donzetta Kohut M.D.   On: 04/03/2020 07:54   DG Chest Port 1 View  Result Date: 04/02/2020 CLINICAL DATA:  Acute respiratory failure with hypoxemia, COVID-19 EXAM: PORTABLE CHEST 1 VIEW COMPARISON:  Portable exam 0652 hours compared 03/30/2020 FINDINGS: Normal heart size, mediastinal contours, and pulmonary vascularity. Atherosclerotic calcification aorta. Scattered pulmonary infiltrates bilaterally consistent with multifocal pneumonia most confluent in the LEFT lower lobe. No pleural effusion or pneumothorax. Mild osseous demineralization. IMPRESSION: Persistent pulmonary infiltrates consistent with multifocal pneumonia and history of COVID-19, slightly improved. Electronically Signed   By: Ulyses Southward M.D.   On: 04/02/2020 08:34   DG Chest Port 1 View  Result Date: 03/30/2020 CLINICAL DATA:  Patient with history of COVID-19. Worsening shortness of breath. EXAM: PORTABLE CHEST 1 VIEW COMPARISON:  None. FINDINGS: Monitoring leads overlie the patient. Enlarged cardiac and mediastinal contours. Diffuse bilateral airspace opacities. Probable small left pleural effusion. No pneumothorax. IMPRESSION: 1. Cardiomegaly. 2. Diffuse bilateral airspace opacities most  compatible with multifocal pneumonia. Electronically Signed   By: Annia Belt M.D.   On: 03/30/2020 12:51   VAS [place] LOWER EXTREMITY VENOUS (DVT)  Result Date: 04/02/2020  Lower Venous DVTStudy Other Indications: Covid positive with elevated d-dimer. Performing Technologist: Marilynne Halsted RDMS, RVT  Examination Guidelines: A complete evaluation includes B-mode imaging, spectral Doppler, color Doppler, and power Doppler as needed of all accessible portions of each vessel. Bilateral testing is considered an integral part of a complete examination. Limited examinations for reoccurring indications may be performed as noted. The reflux portion of the exam is performed with the patient in reverse Trendelenburg.  +---------+---------------+---------+-----------+----------+--------------+ RIGHT    CompressibilityPhasicitySpontaneityPropertiesThrombus Aging +---------+---------------+---------+-----------+----------+--------------+ CFV      Full           Yes      Yes                                 +---------+---------------+---------+-----------+----------+--------------+ SFJ      Full                                                        +---------+---------------+---------+-----------+----------+--------------+  FV Prox  Full                                                        +---------+---------------+---------+-----------+----------+--------------+ FV Mid   Full                                                        +---------+---------------+---------+-----------+----------+--------------+ FV DistalFull                                                        +---------+---------------+---------+-----------+----------+--------------+ PFV      Full                                                        +---------+---------------+---------+-----------+----------+--------------+ POP      Full           Yes      Yes                                  +---------+---------------+---------+-----------+----------+--------------+ PTV      Full                                                        +---------+---------------+---------+-----------+----------+--------------+ PERO     Full                                                        +---------+---------------+---------+-----------+----------+--------------+   +---------+---------------+---------+-----------+----------+--------------+ LEFT     CompressibilityPhasicitySpontaneityPropertiesThrombus Aging +---------+---------------+---------+-----------+----------+--------------+ CFV      Full           Yes      Yes                                 +---------+---------------+---------+-----------+----------+--------------+ SFJ      Full                                                        +---------+---------------+---------+-----------+----------+--------------+ FV Prox  Full                                                        +---------+---------------+---------+-----------+----------+--------------+  FV Mid   Full                                                        +---------+---------------+---------+-----------+----------+--------------+ FV DistalFull                                                        +---------+---------------+---------+-----------+----------+--------------+ PFV      Full                                                        +---------+---------------+---------+-----------+----------+--------------+ POP      Full           Yes      Yes                                 +---------+---------------+---------+-----------+----------+--------------+ PTV      Full                                                        +---------+---------------+---------+-----------+----------+--------------+ PERO     Full                                                         +---------+---------------+---------+-----------+----------+--------------+     Summary: BILATERAL: - No evidence of deep vein thrombosis seen in the lower extremities, bilaterally. -No evidence of popliteal cyst, bilaterally.   *See table(s) above for measurements and observations. Electronically signed by Gretta Began MD on 04/02/2020 at 5:04:02 PM.    Final    ECHOCARDIOGRAM LIMITED  Result Date: 03/31/2020    ECHOCARDIOGRAM LIMITED REPORT   Patient Name:   GRETNA BERGIN Date of Exam: 03/31/2020 Medical Rec #:  [id number]     Height:       62.0 in Accession #:    [id number]    Weight:       142.2 lb Date of Birth:  Aug 14, 1951     BSA:          1.654 m Patient Age:    68 years      BP:           136/80 mmHg Patient Gender: F             HR:           86 bpm. Exam Location:  Inpatient Procedure: Limited Echo, Color Doppler and Cardiac Doppler Indications:    R06.9 DOE  History:        Patient has no prior history of Echocardiogram examinations.  Risk Factors:Dyslipidemia.  Sonographer:    Irving Burton Senior RDCS Referring Phys: Humberto Seals  Sonographer Comments: COVID+ at time of study IMPRESSIONS  1. Abnormal septal motion with distal hypokinesis . Left ventricular ejection fraction, by estimation, is 50 to 55%. The left ventricle has low normal function. Left ventricular endocardial border not optimally defined to evaluate regional wall motion. Left ventricular diastolic parameters are indeterminate.  2. The mitral valve is normal in structure. Mild mitral valve regurgitation.  3. The aortic valve is tricuspid. Aortic valve regurgitation is not visualized. Mild aortic valve sclerosis is present, with no evidence of aortic valve stenosis.  4. There is normal pulmonary artery systolic pressure.  5. The inferior vena cava is normal in size with greater than 50% respiratory variability, suggesting right atrial pressure of 3 mmHg. FINDINGS  Left Ventricle: Abnormal septal motion with distal  hypokinesis. Left ventricular ejection fraction, by estimation, is 50 to 55%. The left ventricle has low normal function. Left ventricular endocardial border not optimally defined to evaluate regional wall motion. The left ventricular internal cavity size was normal in size. Right Ventricle: There is normal pulmonary artery systolic pressure. The tricuspid regurgitant velocity is 2.33 m/s, and with an assumed right atrial pressure of 3 mmHg, the estimated right ventricular systolic pressure is 24.7 mmHg. Mitral Valve: The mitral valve is normal in structure. There is mild thickening of the mitral valve leaflet(s). There is mild calcification of the mitral valve leaflet(s). Mild mitral annular calcification. Mild mitral valve regurgitation. Tricuspid Valve: The tricuspid valve is normal in structure. Tricuspid valve regurgitation is mild. Aortic Valve: The aortic valve is tricuspid. Aortic valve regurgitation is not visualized. Mild aortic valve sclerosis is present, with no evidence of aortic valve stenosis. Venous: The inferior vena cava is normal in size with greater than 50% respiratory variability, suggesting right atrial pressure of 3 mmHg. IAS/Shunts: The interatrial septum was not well visualized. RIGHT VENTRICLE RV S prime:     10.80 cm/s TAPSE (M-mode): 1.7 cm AORTIC VALVE LVOT Vmax:   95.30 cm/s LVOT Vmean:  65.900 cm/s LVOT VTI:    0.180 m MITRAL VALVE               TRICUSPID VALVE MV Area (PHT): 4.21 cm    TR Peak grad:   21.7 mmHg MV Decel Time: 180 msec    TR Vmax:        233.00 cm/s MV E velocity: 57.70 cm/s MV A velocity: 73.20 cm/s  SHUNTS MV E/A ratio:  0.79        Systemic VTI: 0.18 m Charlton Haws MD Electronically signed by Charlton Haws MD Signature Date/Time: 03/31/2020/3:13:28 PM    Final

## 2020-04-11 NOTE — Plan of Care (Signed)
  Problem: Clinical Measurements: Goal: Ability to maintain clinical measurements within normal limits will improve Outcome: Progressing Goal: Will remain free from infection Outcome: Progressing Goal: Diagnostic test results will improve Outcome: Progressing Goal: Respiratory complications will improve Outcome: Progressing Goal: Cardiovascular complication will be avoided Outcome: Progressing   Problem: Nutrition: Goal: Adequate nutrition will be maintained Outcome: Progressing   Problem: Elimination: Goal: Will not experience complications related to bowel motility Outcome: Progressing Goal: Will not experience complications related to urinary retention Outcome: Progressing   Problem: Respiratory: Goal: Will maintain a patent airway Outcome: Progressing Goal: Complications related to the disease process, condition or treatment will be avoided or minimized Outcome: Progressing   Problem: Education: Goal: Knowledge of General Education information will improve Description: Including pain rating scale, medication(s)/side effects and non-pharmacologic comfort measures Outcome: Not Progressing   Problem: Health Behavior/Discharge Planning: Goal: Ability to manage health-related needs will improve Outcome: Not Progressing   Problem: Activity: Goal: Risk for activity intolerance will decrease Outcome: Not Progressing   Problem: Coping: Goal: Level of anxiety will decrease Outcome: Not Progressing   Problem: Education: Goal: Knowledge of risk factors and measures for prevention of condition will improve Outcome: Not Progressing   Problem: Coping: Goal: Psychosocial and spiritual needs will be supported Outcome: Not Progressing

## 2020-04-12 LAB — COMPREHENSIVE METABOLIC PANEL
ALT: 24 U/L (ref 0–44)
AST: 18 U/L (ref 15–41)
Albumin: 2 g/dL — ABNORMAL LOW (ref 3.5–5.0)
Alkaline Phosphatase: 46 U/L (ref 38–126)
Anion gap: 9 (ref 5–15)
BUN: 17 mg/dL (ref 8–23)
CO2: 25 mmol/L (ref 22–32)
Calcium: 8.5 mg/dL — ABNORMAL LOW (ref 8.9–10.3)
Chloride: 102 mmol/L (ref 98–111)
Creatinine, Ser: 0.58 mg/dL (ref 0.44–1.00)
GFR calc Af Amer: >60 mL/min (ref >60)
GFR calc non Af Amer: >60 mL/min (ref >60)
Glucose, Bld: 182 mg/dL — ABNORMAL HIGH (ref 70–99)
Potassium: 4.9 mmol/L (ref 3.5–5.1)
Sodium: 136 mmol/L (ref 135–145)
Total Bilirubin: 0.3 mg/dL (ref 0.3–1.2)
Total Protein: 6 g/dL — ABNORMAL LOW (ref 6.5–8.1)

## 2020-04-12 LAB — CBC
HCT: 40.9 % (ref 36.0–46.0)
Hemoglobin: 12.9 g/dL (ref 12.0–15.0)
MCH: 29.1 pg (ref 26.0–34.0)
MCHC: 31.5 g/dL (ref 30.0–36.0)
MCV: 92.1 fL (ref 80.0–100.0)
Platelets: 296 10*3/uL (ref 150–400)
RBC: 4.44 MIL/uL (ref 3.87–5.11)
RDW: 13.8 % (ref 11.5–15.5)
WBC: 17.4 10*3/uL — ABNORMAL HIGH (ref 4.0–10.5)
nRBC: 0 % (ref 0.0–0.2)

## 2020-04-12 LAB — D-DIMER, QUANTITATIVE: D-Dimer, Quant: 3.3 ug/mL-FEU — ABNORMAL HIGH (ref 0.00–0.50)

## 2020-04-12 MED ORDER — HALOPERIDOL LACTATE 5 MG/ML IJ SOLN
1.0000 mg | Freq: Once | INTRAMUSCULAR | Status: AC
  Administered 2020-04-12: 1 mg via INTRAVENOUS
  Filled 2020-04-12: qty 1

## 2020-04-12 NOTE — Plan of Care (Signed)
  Problem: Education: Goal: Knowledge of General Education information will improve Description: Including pain rating scale, medication(s)/side effects and non-pharmacologic comfort measures 04/12/2020 2035 by Bennie Pierini, RN Outcome: Progressing 04/12/2020 2035 by Bennie Pierini, RN Outcome: Progressing   Problem: Health Behavior/Discharge Planning: Goal: Ability to manage health-related needs will improve 04/12/2020 2035 by Bennie Pierini, RN Outcome: Progressing 04/12/2020 2035 by Bennie Pierini, RN Outcome: Progressing   Problem: Clinical Measurements: Goal: Ability to maintain clinical measurements within normal limits will improve 04/12/2020 2035 by Bennie Pierini, RN Outcome: Progressing 04/12/2020 2035 by Bennie Pierini, RN Outcome: Progressing Goal: Will remain free from infection 04/12/2020 2035 by Bennie Pierini, RN Outcome: Progressing 04/12/2020 2035 by Bennie Pierini, RN Outcome: Progressing Goal: Diagnostic test results will improve 04/12/2020 2035 by Bennie Pierini, RN Outcome: Progressing 04/12/2020 2035 by Bennie Pierini, RN Outcome: Progressing Goal: Cardiovascular complication will be avoided 04/12/2020 2035 by Bennie Pierini, RN Outcome: Progressing 04/12/2020 2035 by Bennie Pierini, RN Outcome: Progressing   Problem: Activity: Goal: Risk for activity intolerance will decrease 04/12/2020 2035 by Bennie Pierini, RN Outcome: Progressing 04/12/2020 2035 by Bennie Pierini, RN Outcome: Progressing   Problem: Nutrition: Goal: Adequate nutrition will be maintained 04/12/2020 2035 by Bennie Pierini, RN Outcome: Progressing 04/12/2020 2035 by Bennie Pierini, RN Outcome: Progressing   Problem: Coping: Goal: Level of anxiety will decrease 04/12/2020 2035 by Bennie Pierini, RN Outcome: Progressing 04/12/2020 2035 by Bennie Pierini, RN Outcome: Progressing   Problem: Elimination: Goal: Will not experience complications related to bowel motility 04/12/2020 2035 by  Bennie Pierini, RN Outcome: Progressing 04/12/2020 2035 by Bennie Pierini, RN Outcome: Progressing Goal: Will not experience complications related to urinary retention 04/12/2020 2035 by Bennie Pierini, RN Outcome: Progressing 04/12/2020 2035 by Bennie Pierini, RN Outcome: Progressing   Problem: Education: Goal: Knowledge of risk factors and measures for prevention of condition will improve 04/12/2020 2035 by Bennie Pierini, RN Outcome: Progressing 04/12/2020 2035 by Bennie Pierini, RN Outcome: Progressing   Problem: Coping: Goal: Psychosocial and spiritual needs will be supported 04/12/2020 2035 by Bennie Pierini, RN Outcome: Progressing 04/12/2020 2035 by Bennie Pierini, RN Outcome: Progressing   Problem: Respiratory: Goal: Will maintain a patent airway 04/12/2020 2035 by Bennie Pierini, RN Outcome: Progressing 04/12/2020 2035 by Bennie Pierini, RN Outcome: Progressing Goal: Complications related to the disease process, condition or treatment will be avoided or minimized 04/12/2020 2035 by Bennie Pierini, RN Outcome: Progressing 04/12/2020 2035 by Bennie Pierini, RN Outcome: Progressing

## 2020-04-12 NOTE — Progress Notes (Signed)
PROGRESS NOTE                                                                                                                                                                                                             Patient Demographics:    Christina Russell, is a 69 y.o. female, DOB - Sep 21, 1950, TKZ:[id number]  Outpatient Primary MD for the patient is [name], MD    LOS - 13  Admit date - 03/30/2020    Chief Complaint  Patient presents with  . Covid/ Distress  . COVID positive       Brief Narrative 69 year old African-American female who is not vaccinated for Covid and has underlying history of dementia, dyslipidemia who presented to the hospital with shortness of breath.  She was diagnosed with acute hypoxic respiratory failure due to COVID-19 pneumonia and admitted to ICU on heated high flow.  She also developed some CHF.  She was stabilized and subsequently transferred to my service on 04/02/2020.   Subjective:   Patient remains confused, unreliable historian, no significant events as discussed with staff, but she has been more confused trying to pull her nasal cannula.   Assessment  & Plan :    Acute Hypoxic Resp. Failure due to Acute Covid 19 Viral Pneumonitis during the ongoing 2020 Covid 19 Pandemic -  - - unfortunately she is unvaccinated  - Has severe parenchymal injury,  -She is treated with IV steroids . -Was treated with IV remdesivir . -He is been treated with  Baricitinib. -He remains with significant oxygen requirement, initially requiring on HHFL 55 Lit, this has improved, she is currently on 25 L heated high flow nasal cannula, this remains significant . -She treated for aspiration pneumonia, 7 days of IV Unasyn . - She has very guarded diagnosis, she is currently DNR., she is DNR if declines further comfort measures.  Palliative care also consulted and following.  Encouraged the patient to sit up  in chair in the daytime use I-S and flutter valve for pulmonary toiletry and then prone in bed when at night.  Will advance activity and titrate down oxygen as possible.    SpO2: 94 % O2 Flow Rate (L/min): 25 L/min FiO2 (%): 80 %    Recent Labs  Lab 04/06/20 0255 04/06/20 0255 04/07/20 0323 04/07/20 0323 04/08/20 0557 04/09/20 0701 04/10/20  0254 04/11/20 0119 04/12/20 0043  WBC 15.1*   < > 15.6*   < > 17.5* 14.5* 19.6* 16.8* 17.4*  CRP 7.8*  --  6.1*  --  3.3* 1.4*  --   --   --   DDIMER 8.05*   < > 3.50*   < > 3.66* 3.30* 3.27* 3.16* 3.30*  BNP 57.0  --  45.4  --  42.7 46.1  --   --   --   PROCALCITON <0.10  --  <0.10  --  <0.10 <0.10  --   --   --   AST 19   < > 19   < > ALT 19   < > 19   < > ALKPHOS 57   < > 53   < > 52 48 48 50 46  BILITOT 0.3   < > 0.7   < > 0.8 0.6 0.5 0.8 0.3  ALBUMIN 2.3*   < > 2.0*   < > 2.1* 1.9* 1.9* 2.0* 2.0*   < > = values in this interval not displayed.    Aspiration pneumonia -Treated with IV unasyn -Followed by SLP, she remains on dysphagia 1 with nectar thick liquid  Extremely high D dimer - high risk for a clot -ve Leg [place], stable R Atrial pressure on TTE, difficulty trending down, D-dimer trending down,  decreased her Lovenox to 0.5 mg/kg every 12 hours.  Underlying history of dementia/with acute delirium -.  At high risk for delirium, minimize narcotics and benzodiazepines, Currently does have mild toxic and metabolic encephalopathy on top of her dementia.  Is requiring as needed Haldol.  Dyslipidemia.  Continue home dose statin.  Acute on chronic diastolic heart failure with EF 50 to 55%. Outpatient cardiology follow-up for some distal septal hypokinesis, continue combination of aspirin, statin and beta-blocker for secondary prevention for now.  Diuresis on hold given some hyponatremia and evidence of volume depletion on echo.   Hypernatremia -resolved with D5W  Possible aspiration pneumonia.  Speech  following, on dysphagia 1 diet with nectar thick liquids, feeding assistance aspiration precautions, Unasyn.  MRSA nasal swab negative.     Condition - Extremely Guarded ++  Family Communication  : I have updated her daughter this morning who is hospitalized as well .  Code Status : DNR  Consults  :  PCCM  Procedures  :    Leg [place] - No DVT  TTE - 1. Abnormal septal motion with distal hypokinesis . Left ventricular ejection fraction, by estimation, is 50 to 55%. The left ventricle has low normal function. Left ventricular endocardial border not optimally defined to evaluate regional wall motion. Left ventricular diastolic parameters are indeterminate.  2. The mitral valve is normal in structure. Mild mitral valve regurgitation.  3. The aortic valve is tricuspid. Aortic valve regurgitation is not visualized. Mild aortic valve sclerosis is present, with no evidence of aortic valve stenosis.  4. There is normal pulmonary artery systolic pressure.  5. The inferior vena cava is normal in size with greater than 50% respiratory variability, suggesting right atrial pressure of 3 mmHg.   PUD Prophylaxis :  PPI  Disposition Plan  :    Status is: Inpatient  Remains inpatient appropriate because:IV treatments appropriate due to intensity of illness or inability to take PO   Dispo: The patient is from: Home  Anticipated d/c is to: Home              Anticipated d/c date is: > 3 days              Patient currently is not medically stable to d/c.  DVT Prophylaxis  :  Lovenox   Lab Results  Component Value Date   PLT 296 04/12/2020    Diet :  Diet Order            DIET - DYS 1 Room service appropriate? No; Fluid consistency: Nectar Thick  Diet effective now                  Inpatient Medications  Scheduled Meds: . albuterol  2 puff Inhalation TID  . vitamin C  500 mg Oral Daily  . atorvastatin  40 mg Oral Daily  . baricitinib  2 mg Oral Daily  . enoxaparin (LOVENOX)  injection  30 mg Subcutaneous Q12H  . mouth rinse  15 mL Mouth Rinse BID  . methylPREDNISolone (SOLU-MEDROL) injection  40 mg Intravenous BID  . metoprolol tartrate  12.5 mg Oral BID  . pantoprazole  40 mg Oral Daily  . zinc sulfate  220 mg Oral Daily   Continuous Infusions: . sodium chloride Stopped (04/09/20 2034)   PRN Meds:.sodium chloride, acetaminophen, chlorpheniramine-HYDROcodone, docusate sodium, [DISCONTINUED] ondansetron **OR** ondansetron (ZOFRAN) IV, polyethylene glycol, Resource ThickenUp Clear  Antibiotics  :    Anti-infectives (From admission, onward)   Start     Dose/Rate Route Frequency Ordered Stop   04/03/20 1000  Ampicillin-Sulbactam (UNASYN) 3 g in sodium chloride 0.9 % 100 mL IVPB        3 g 200 mL/hr over 30 Minutes Intravenous Every 6 hours 04/03/20 0935 04/09/20 1900   03/31/20 1000  remdesivir 100 mg in sodium chloride 0.9 % 100 mL IVPB       "Followed by" Linked Group Details   100 mg 200 mL/hr over 30 Minutes Intravenous Daily 03/30/20 1445 04/03/20 0917   03/30/20 1445  remdesivir 200 mg in sodium chloride 0.9% 250 mL IVPB       "Followed by" Linked Group Details   200 mg 580 mL/hr over 30 Minutes Intravenous Once 03/30/20 1445 03/30/20 1714       Time Spent in minutes  30   Huey Bienenstock M.D on 04/12/2020 at 12:26 PM  To page go to [email or web address]  [facility] -  Office  [phone]    See all Orders from today for further details    Objective:   Vitals:   04/12/20 0400 04/12/20 0726 04/12/20 0935 04/12/20 1106  BP: 119/73 108/74  127/76  Pulse: 60 67 79 89  Resp: 17 17 (!) 24   Temp: 98 F (36.7 C) 97.6 F (36.4 C)    TempSrc: Axillary Axillary    SpO2: 94% 94% 94%   Weight:      Height:        Wt Readings from Last 3 Encounters:  04/11/20 62.2 kg  01/30/20 65.3 kg  12/05/19 66 kg     Intake/Output Summary (Last 24 hours) at 04/12/2020 1226 Last data filed at 04/12/2020 0500 Gross per 24 hour  Intake 580 ml    Output 1050 ml  Net -470 ml     Physical Exam  Awake, frail, confused, demented and restless. Symmetrical Chest wall movement, Good air movement bilaterally, CTAB RRR,No Gallops,Rubs or new Murmurs, No Parasternal Heave +ve B.Sounds, Abd Soft, No tenderness, No  rebound - guarding or rigidity. No Cyanosis, Clubbing or edema, No new Rash or bruise        Data Review:    CBC Recent Labs  Lab 04/06/20 0255 04/06/20 0255 04/07/20 0323 04/07/20 0323 04/08/20 0557 04/09/20 0701 04/10/20 0254 04/11/20 0119 04/12/20 0043  WBC 15.1*   < > 15.6*   < > 17.5* 14.5* 19.6* 16.8* 17.4*  HGB 13.4   < > 13.1   < > 13.2 11.9* 12.2 12.8 12.9  HCT 42.8   < > 42.3   < > 42.1 37.8 38.4 39.8 40.9  PLT 311   < > 297   < > 312 286 312 303 296  MCV 94.1   < > 94.0   < > 94.4 93.3 93.4 92.1 92.1  MCH 29.5   < > 29.1   < > 29.6 29.4 29.7 29.6 29.1  MCHC 31.3   < > 31.0   < > 31.4 31.5 31.8 32.2 31.5  RDW 14.2   < > 14.0   < > 13.8 13.8 13.8 13.7 13.8  LYMPHSABS 0.5*  --  0.9  --  1.3 1.3  --   --   --   MONOABS 0.8  --  0.6  --  0.7 0.8  --   --   --   EOSABS 0.0  --  0.0  --  0.0 0.0  --   --   --   BASOSABS 0.0  --  0.0  --  0.0 0.0  --   --   --    < > = values in this interval not displayed.    Chemistries  Recent Labs  Lab 04/06/20 0255 04/06/20 0255 04/07/20 0323 04/07/20 0323 04/08/20 0557 04/09/20 0701 04/10/20 0254 04/11/20 0119 04/12/20 0043  NA 145   < > 148*   < > 143 141 137 138 136  K 3.7   < > 4.7   < > 4.7 4.6 4.5 4.5 4.9  CL 100   < > 104   < > 103 106 102 103 102  CO2 29   < > 34*   < > 28 26 24 28 25   GLUCOSE 356*   < > 181*   < > 174* 176* 227* 173* 182*  BUN 28*   < > 17   < > 19 18 18 19 17   CREATININE 0.94   < > 0.74   < > 0.75 0.69 0.58 0.63 0.58  CALCIUM 9.0   < > 8.9   < > 8.7* 8.3* 8.6* 8.5* 8.5*  AST 19   < > 19   < > 20 19 18 16 18   ALT 19   < > 19   < > 21 22 24 21 24   ALKPHOS 57   < > 53   < > 52 48 48 50 46  BILITOT 0.3   < > 0.7   < > 0.8 0.6  0.5 0.8 0.3  MG 2.5*  --  2.5*  --  2.4 2.3  --   --   --    < > = values in this interval not displayed.     ------------------------------------------------------------------------------------------------------------------ No results for input(s): CHOL, HDL, LDLCALC, TRIG, CHOLHDL, LDLDIRECT in the last 72 hours.  Lab Results  Component Value Date   HGBA1C 5.3 04/15/2017   ------------------------------------------------------------------------------------------------------------------ No results for input(s): TSH, T4TOTAL, T3FREE, THYROIDAB in the last 72 hours.  Invalid input(s): FREET3  Cardiac Enzymes  No results for input(s): CKMB, TROPONINI, MYOGLOBIN in the last 168 hours.  Invalid input(s): CK ------------------------------------------------------------------------------------------------------------------    Component Value Date/Time   BNP 46.1 04/09/2020 0701    Micro Results No results found for this or any previous visit (from the past 240 hour(s)).  Radiology Reports DG Chest Port 1 View  Result Date: 04/07/2020 CLINICAL DATA:  Shortness of breath.  Reported COVID-19 positive. EXAM: PORTABLE CHEST 1 VIEW COMPARISON:  April 03, 2020 FINDINGS: There is persistent airspace consolidation in the left base with small left pleural effusion. There is slightly less airspace consolidation in the right base compared to recent study. There is ill-defined opacity in the right mid lung, stable. More subtle ill-defined opacity is noted in the left mid lung. Heart size and pulmonary vascular normal. No adenopathy. There is aortic atherosclerosis. No bone lesions. IMPRESSION: Areas of multifocal pneumonia with consolidation greatest in the left base region. Small left pleural effusion. Suspect atypical organism pneumonia as most likely etiology. Superimposed bacterial pneumonia in the left base is possible. Stable cardiac silhouette. Aortic Atherosclerosis (ICD10-I70.0). Electronically  Signed   By: Bretta Bang III M.D.   On: 04/07/2020 08:08   DG Chest Port 1 View  Result Date: 04/03/2020 CLINICAL DATA:  History of COVID infection EXAM: PORTABLE CHEST 1 VIEW COMPARISON:  April 02, 2020 FINDINGS: Images rotated to the LEFT. Accounting for this rotation cardiomediastinal contours are stable. Increasing opacity at the LEFT lung base compared to the prior study. Also with increased opacity at the RIGHT lung base. Air bronchograms are seen at the LEFT lung base on today's study as well. Exam is more similar to the study of August 15. Probable small LEFT effusion as before. On limited assessment skeletal structures are unremarkable. IMPRESSION: Bilateral opacities worse at the lung bases and worse in the LEFT lung base, similar to prior imaging in this patient with reported history of COVID-19 pneumonia. Electronically Signed   By: Donzetta Kohut M.D.   On: 04/03/2020 07:54   DG Chest Port 1 View  Result Date: 04/02/2020 CLINICAL DATA:  Acute respiratory failure with hypoxemia, COVID-19 EXAM: PORTABLE CHEST 1 VIEW COMPARISON:  Portable exam 0652 hours compared 03/30/2020 FINDINGS: Normal heart size, mediastinal contours, and pulmonary vascularity. Atherosclerotic calcification aorta. Scattered pulmonary infiltrates bilaterally consistent with multifocal pneumonia most confluent in the LEFT lower lobe. No pleural effusion or pneumothorax. Mild osseous demineralization. IMPRESSION: Persistent pulmonary infiltrates consistent with multifocal pneumonia and history of COVID-19, slightly improved. Electronically Signed   By: Ulyses Southward M.D.   On: 04/02/2020 08:34   DG Chest Port 1 View  Result Date: 03/30/2020 CLINICAL DATA:  Patient with history of COVID-19. Worsening shortness of breath. EXAM: PORTABLE CHEST 1 VIEW COMPARISON:  None. FINDINGS: Monitoring leads overlie the patient. Enlarged cardiac and mediastinal contours. Diffuse bilateral airspace opacities. Probable small left pleural  effusion. No pneumothorax. IMPRESSION: 1. Cardiomegaly. 2. Diffuse bilateral airspace opacities most compatible with multifocal pneumonia. Electronically Signed   By: Annia Belt M.D.   On: 03/30/2020 12:51   VAS [place] LOWER EXTREMITY VENOUS (DVT)  Result Date: 04/02/2020  Lower Venous DVTStudy Other Indications: Covid positive with elevated d-dimer. Performing Technologist: Marilynne Halsted RDMS, RVT  Examination Guidelines: A complete evaluation includes B-mode imaging, spectral Doppler, color Doppler, and power Doppler as needed of all accessible portions of each vessel. Bilateral testing is considered an integral part of a complete examination. Limited examinations for reoccurring indications may be performed as noted. The reflux portion of the  exam is performed with the patient in reverse Trendelenburg.  +---------+---------------+---------+-----------+----------+--------------+ RIGHT    CompressibilityPhasicitySpontaneityPropertiesThrombus Aging +---------+---------------+---------+-----------+----------+--------------+ CFV      Full           Yes      Yes                                 +---------+---------------+---------+-----------+----------+--------------+ SFJ      Full                                                        +---------+---------------+---------+-----------+----------+--------------+ FV Prox  Full                                                        +---------+---------------+---------+-----------+----------+--------------+ FV Mid   Full                                                        +---------+---------------+---------+-----------+----------+--------------+ FV DistalFull                                                        +---------+---------------+---------+-----------+----------+--------------+ PFV      Full                                                         +---------+---------------+---------+-----------+----------+--------------+ POP      Full           Yes      Yes                                 +---------+---------------+---------+-----------+----------+--------------+ PTV      Full                                                        +---------+---------------+---------+-----------+----------+--------------+ PERO     Full                                                        +---------+---------------+---------+-----------+----------+--------------+   +---------+---------------+---------+-----------+----------+--------------+ LEFT     CompressibilityPhasicitySpontaneityPropertiesThrombus Aging +---------+---------------+---------+-----------+----------+--------------+ CFV      Full           Yes      Yes                                 +---------+---------------+---------+-----------+----------+--------------+  SFJ      Full                                                        +---------+---------------+---------+-----------+----------+--------------+ FV Prox  Full                                                        +---------+---------------+---------+-----------+----------+--------------+ FV Mid   Full                                                        +---------+---------------+---------+-----------+----------+--------------+ FV DistalFull                                                        +---------+---------------+---------+-----------+----------+--------------+ PFV      Full                                                        +---------+---------------+---------+-----------+----------+--------------+ POP      Full           Yes      Yes                                 +---------+---------------+---------+-----------+----------+--------------+ PTV      Full                                                         +---------+---------------+---------+-----------+----------+--------------+ PERO     Full                                                        +---------+---------------+---------+-----------+----------+--------------+     Summary: BILATERAL: - No evidence of deep vein thrombosis seen in the lower extremities, bilaterally. -No evidence of popliteal cyst, bilaterally.   *See table(s) above for measurements and observations. Electronically signed by Gretta Began MD on 04/02/2020 at 5:04:02 PM.    Final    ECHOCARDIOGRAM LIMITED  Result Date: 03/31/2020    ECHOCARDIOGRAM LIMITED REPORT   Patient Name:   Christina Russell Date of Exam: 03/31/2020 Medical Rec #:  [id number]     Height:       62.0 in Accession #:    [id number]    Weight:       142.2 lb  Date of Birth:  06/08/1951     BSA:          1.654 m Patient Age:    89 years      BP:           136/80 mmHg Patient Gender: F             HR:           86 bpm. Exam Location:  Inpatient Procedure: Limited Echo, Color Doppler and Cardiac Doppler Indications:    R06.9 DOE  History:        Patient has no prior history of Echocardiogram examinations.                 Risk Factors:Dyslipidemia.  Sonographer:    Irving Burton Senior RDCS Referring Phys: Humberto Seals  Sonographer Comments: COVID+ at time of study IMPRESSIONS  1. Abnormal septal motion with distal hypokinesis . Left ventricular ejection fraction, by estimation, is 50 to 55%. The left ventricle has low normal function. Left ventricular endocardial border not optimally defined to evaluate regional wall motion. Left ventricular diastolic parameters are indeterminate.  2. The mitral valve is normal in structure. Mild mitral valve regurgitation.  3. The aortic valve is tricuspid. Aortic valve regurgitation is not visualized. Mild aortic valve sclerosis is present, with no evidence of aortic valve stenosis.  4. There is normal pulmonary artery systolic pressure.  5. The inferior vena cava is normal in size with  greater than 50% respiratory variability, suggesting right atrial pressure of 3 mmHg. FINDINGS  Left Ventricle: Abnormal septal motion with distal hypokinesis. Left ventricular ejection fraction, by estimation, is 50 to 55%. The left ventricle has low normal function. Left ventricular endocardial border not optimally defined to evaluate regional wall motion. The left ventricular internal cavity size was normal in size. Right Ventricle: There is normal pulmonary artery systolic pressure. The tricuspid regurgitant velocity is 2.33 m/s, and with an assumed right atrial pressure of 3 mmHg, the estimated right ventricular systolic pressure is 24.7 mmHg. Mitral Valve: The mitral valve is normal in structure. There is mild thickening of the mitral valve leaflet(s). There is mild calcification of the mitral valve leaflet(s). Mild mitral annular calcification. Mild mitral valve regurgitation. Tricuspid Valve: The tricuspid valve is normal in structure. Tricuspid valve regurgitation is mild. Aortic Valve: The aortic valve is tricuspid. Aortic valve regurgitation is not visualized. Mild aortic valve sclerosis is present, with no evidence of aortic valve stenosis. Venous: The inferior vena cava is normal in size with greater than 50% respiratory variability, suggesting right atrial pressure of 3 mmHg. IAS/Shunts: The interatrial septum was not well visualized. RIGHT VENTRICLE RV S prime:     10.80 cm/s TAPSE (M-mode): 1.7 cm AORTIC VALVE LVOT Vmax:   95.30 cm/s LVOT Vmean:  65.900 cm/s LVOT VTI:    0.180 m MITRAL VALVE               TRICUSPID VALVE MV Area (PHT): 4.21 cm    TR Peak grad:   21.7 mmHg MV Decel Time: 180 msec    TR Vmax:        233.00 cm/s MV E velocity: 57.70 cm/s MV A velocity: 73.20 cm/s  SHUNTS MV E/A ratio:  0.79        Systemic VTI: 0.18 m Charlton Haws MD Electronically signed by Charlton Haws MD Signature Date/Time: 03/31/2020/3:13:28 PM    Final

## 2020-04-13 LAB — COMPREHENSIVE METABOLIC PANEL
ALT: 25 U/L (ref 0–44)
AST: 17 U/L (ref 15–41)
Albumin: 2.1 g/dL — ABNORMAL LOW (ref 3.5–5.0)
Alkaline Phosphatase: 48 U/L (ref 38–126)
Anion gap: 10 (ref 5–15)
BUN: 16 mg/dL (ref 8–23)
CO2: 24 mmol/L (ref 22–32)
Calcium: 8.4 mg/dL — ABNORMAL LOW (ref 8.9–10.3)
Chloride: 100 mmol/L (ref 98–111)
Creatinine, Ser: 0.64 mg/dL (ref 0.44–1.00)
GFR calc Af Amer: >60 mL/min (ref >60)
GFR calc non Af Amer: >60 mL/min (ref >60)
Glucose, Bld: 266 mg/dL — ABNORMAL HIGH (ref 70–99)
Potassium: 5 mmol/L (ref 3.5–5.1)
Sodium: 134 mmol/L — ABNORMAL LOW (ref 135–145)
Total Bilirubin: 0.6 mg/dL (ref 0.3–1.2)
Total Protein: 6.4 g/dL — ABNORMAL LOW (ref 6.5–8.1)

## 2020-04-13 LAB — CBC
HCT: 42.4 % (ref 36.0–46.0)
Hemoglobin: 13.7 g/dL (ref 12.0–15.0)
MCH: 30.1 pg (ref 26.0–34.0)
MCHC: 32.3 g/dL (ref 30.0–36.0)
MCV: 93.2 fL (ref 80.0–100.0)
Platelets: 315 10*3/uL (ref 150–400)
RBC: 4.55 MIL/uL (ref 3.87–5.11)
RDW: 14.5 % (ref 11.5–15.5)
WBC: 15 10*3/uL — ABNORMAL HIGH (ref 4.0–10.5)
nRBC: 0 % (ref 0.0–0.2)

## 2020-04-13 LAB — GLUCOSE, CAPILLARY
Glucose-Capillary: 130 mg/dL — ABNORMAL HIGH (ref 70–99)
Glucose-Capillary: 152 mg/dL — ABNORMAL HIGH (ref 70–99)
Glucose-Capillary: 149 mg/dL — ABNORMAL HIGH (ref 70–99)

## 2020-04-13 LAB — D-DIMER, QUANTITATIVE: D-Dimer, Quant: 8.72 ug/mL-FEU — ABNORMAL HIGH (ref 0.00–0.50)

## 2020-04-13 MED ORDER — METHYLPREDNISOLONE SODIUM SUCC 40 MG IJ SOLR
40.0000 mg | Freq: Every day | INTRAMUSCULAR | Status: AC
  Administered 2020-04-14 – 2020-04-15 (×2): 40 mg via INTRAVENOUS
  Filled 2020-04-13 (×2): qty 1

## 2020-04-13 MED ORDER — ENOXAPARIN SODIUM 60 MG/0.6ML ~~LOC~~ SOLN
1.0000 mg/kg | Freq: Two times a day (BID) | SUBCUTANEOUS | Status: DC
  Administered 2020-04-13 – 2020-04-15 (×5): 60 mg via SUBCUTANEOUS
  Filled 2020-04-13 (×5): qty 0.6

## 2020-04-13 NOTE — Progress Notes (Signed)
MEWS yellow due to increased HR. Patient denies pain. Other VS stable. Will continue to monitor patient.

## 2020-04-13 NOTE — Plan of Care (Signed)
  Problem: Clinical Measurements: Goal: Ability to maintain clinical measurements within normal limits will improve Outcome: Progressing Goal: Will remain free from infection Outcome: Progressing Goal: Diagnostic test results will improve Outcome: Progressing Goal: Cardiovascular complication will be avoided Outcome: Progressing   Problem: Nutrition: Goal: Adequate nutrition will be maintained Outcome: Progressing   Problem: Elimination: Goal: Will not experience complications related to bowel motility Outcome: Progressing Goal: Will not experience complications related to urinary retention Outcome: Progressing   Problem: Respiratory: Goal: Will maintain a patent airway Outcome: Progressing   Problem: Education: Goal: Knowledge of General Education information will improve Description: Including pain rating scale, medication(s)/side effects and non-pharmacologic comfort measures Outcome: Not Progressing   Problem: Health Behavior/Discharge Planning: Goal: Ability to manage health-related needs will improve Outcome: Not Progressing   Problem: Clinical Measurements: Goal: Respiratory complications will improve Outcome: Not Progressing   Problem: Activity: Goal: Risk for activity intolerance will decrease Outcome: Not Progressing   Problem: Coping: Goal: Level of anxiety will decrease Outcome: Not Progressing   Problem: Education: Goal: Knowledge of risk factors and measures for prevention of condition will improve Outcome: Not Progressing   Problem: Coping: Goal: Psychosocial and spiritual needs will be supported Outcome: Not Progressing   Problem: Respiratory: Goal: Complications related to the disease process, condition or treatment will be avoided or minimized Outcome: Not Progressing

## 2020-04-13 NOTE — Progress Notes (Signed)
PROGRESS NOTE                                                                                                                                                                                                             Patient Demographics:    Christina Russell, is a 69 y.o. female, DOB - 1951/02/06, ZOX:[id number]  Outpatient Primary MD for the patient is [name], MD    LOS - 14  Admit date - 03/30/2020    Chief Complaint  Patient presents with  . Covid/ Distress  . COVID positive       Brief Narrative 69 year old African-American female who is not vaccinated for Covid and has underlying history of dementia, dyslipidemia who presented to the hospital with shortness of breath.  She was diagnosed with acute hypoxic respiratory failure due to COVID-19 pneumonia and admitted to ICU on heated high flow.  She also developed some CHF.  She was stabilized and subsequently transferred to my service on 04/02/2020.   Subjective:   Patient remains confused, unreliable historian, patient less agitated today, her appetite has improved, she had a good breakfast as discussed with staff.   Assessment  & Plan :    Acute Hypoxic Resp. Failure due to Acute Covid 19 Viral Pneumonitis during the ongoing 2020 Covid 19 Pandemic . -unfortunately she is unvaccinated  - Has severe parenchymal injury,  -She is treated with IV steroids , pending her Solu-Medrol, should be tapered off over the next 48 hours. -Was treated with IV remdesivir . -She is been treated with  Baricitinib, did finish total of 14 days -She remains with significant oxygen requirement, initially requiring on HHFL 55 Lit, this has improved, she is currently on 30 L heated high flow nasal cannula, this remains significant . -She treated for aspiration pneumonia, 7 days of IV Unasyn . - She has very guarded diagnosis, she is currently DNR., she is DNR if declines further comfort  measures.  Palliative care also consulted and following.  Encouraged the patient to sit up in chair in the daytime use I-S and flutter valve for pulmonary toiletry.  Will advance activity and titrate down oxygen as possible.    SpO2: 92 % O2 Flow Rate (L/min): 30 L/min FiO2 (%): 70 %    Recent Labs  Lab 04/07/20 0323 04/07/20 0323 04/08/20 0557 04/08/20 0557  04/09/20 0701 04/10/20 0254 04/11/20 0119 04/12/20 0043 04/13/20 0246  WBC 15.6*   < > 17.5*   < > 14.5* 19.6* 16.8* 17.4* 15.0*  CRP 6.1*  --  3.3*  --  1.4*  --   --   --   --   DDIMER 3.50*   < > 3.66*   < > 3.30* 3.27* 3.16* 3.30* 8.72*  BNP 45.4  --  42.7  --  46.1  --   --   --   --   PROCALCITON <0.10  --  <0.10  --  <0.10  --   --   --   --   AST 19   < > 20   < > ALT 19   < > 21   < > ALKPHOS 53   < > 52   < > 48 48 50 46 48  BILITOT 0.7   < > 0.8   < > 0.6 0.5 0.8 0.3 0.6  ALBUMIN 2.0*   < > 2.1*   < > 1.9* 1.9* 2.0* 2.0* 2.1*   < > = values in this interval not displayed.    Aspiration pneumonia -Treated with IV unasyn -Followed by SLP, she remains on dysphagia 1 with nectar thick liquid  Extremely high D dimer - high risk for a clot -ve Leg [place], stable R Atrial pressure on TTE, difficulty trending down, D-dimer trending down initially, but D-dimer is up to 8 today, so she is back on Lovenox 1 mg/kg every 12 hours..  Underlying history of dementia/with acute delirium -.  At high risk for delirium, minimize narcotics and benzodiazepines, Currently does have mild toxic and metabolic encephalopathy on top of her dementia.  Did require 1 dose of Haldol yesterday. Dyslipidemia.  Continue home dose statin.  Acute on chronic diastolic heart failure with EF 50 to 55%. Outpatient cardiology follow-up for some distal septal hypokinesis, continue combination of aspirin, statin and beta-blocker for secondary prevention for now.  Diuresis on hold given some hyponatremia and evidence of  volume depletion on echo.   Hypernatremia -resolved with D5W  Possible aspiration pneumonia.  Speech following, on dysphagia 1 diet with nectar thick liquids, feeding assistance aspiration precautions, Unasyn.  MRSA nasal swab negative.     Condition - Extremely Guarded ++  Family Communication  : Daughter is hospitalized as well, and she has been updated daily.   Code Status : DNR  Consults  :  PCCM  Procedures  :    Leg [place] - No DVT  TTE - 1. Abnormal septal motion with distal hypokinesis . Left ventricular ejection fraction, by estimation, is 50 to 55%. The left ventricle has low normal function. Left ventricular endocardial border not optimally defined to evaluate regional wall motion. Left ventricular diastolic parameters are indeterminate.  2. The mitral valve is normal in structure. Mild mitral valve regurgitation.  3. The aortic valve is tricuspid. Aortic valve regurgitation is not visualized. Mild aortic valve sclerosis is present, with no evidence of aortic valve stenosis.  4. There is normal pulmonary artery systolic pressure.  5. The inferior vena cava is normal in size with greater than 50% respiratory variability, suggesting right atrial pressure of 3 mmHg.   PUD Prophylaxis :  PPI  Disposition Plan  :    Status is: Inpatient  Remains inpatient appropriate because:IV treatments appropriate due to intensity of illness or inability to take PO  Dispo: The patient is from: Home              Anticipated d/c is to: Home              Anticipated d/c date is: > 3 days              Patient currently is not medically stable to d/c.  DVT Prophylaxis  :  Lovenox   Lab Results  Component Value Date   PLT 315 04/13/2020    Diet :  Diet Order            DIET - DYS 1 Room service appropriate? No; Fluid consistency: Nectar Thick  Diet effective now                  Inpatient Medications  Scheduled Meds: . albuterol  2 puff Inhalation TID  . vitamin C  500 mg  Oral Daily  . atorvastatin  40 mg Oral Daily  . enoxaparin (LOVENOX) injection  1 mg/kg Subcutaneous Q12H  . mouth rinse  15 mL Mouth Rinse BID  . methylPREDNISolone (SOLU-MEDROL) injection  40 mg Intravenous BID  . metoprolol tartrate  12.5 mg Oral BID  . pantoprazole  40 mg Oral Daily  . zinc sulfate  220 mg Oral Daily   Continuous Infusions: . sodium chloride Stopped (04/09/20 2034)   PRN Meds:.sodium chloride, acetaminophen, chlorpheniramine-HYDROcodone, docusate sodium, [DISCONTINUED] ondansetron **OR** ondansetron (ZOFRAN) IV, polyethylene glycol, Resource ThickenUp Clear  Antibiotics  :    Anti-infectives (From admission, onward)   Start     Dose/Rate Route Frequency Ordered Stop   04/03/20 1000  Ampicillin-Sulbactam (UNASYN) 3 g in sodium chloride 0.9 % 100 mL IVPB        3 g 200 mL/hr over 30 Minutes Intravenous Every 6 hours 04/03/20 0935 04/09/20 1900   03/31/20 1000  remdesivir 100 mg in sodium chloride 0.9 % 100 mL IVPB       "Followed by" Linked Group Details   100 mg 200 mL/hr over 30 Minutes Intravenous Daily 03/30/20 1445 04/03/20 0917   03/30/20 1445  remdesivir 200 mg in sodium chloride 0.9% 250 mL IVPB       "Followed by" Linked Group Details   200 mg 580 mL/hr over 30 Minutes Intravenous Once 03/30/20 1445 03/30/20 1714      Raihana Balderrama M.D on 04/13/2020 at 11:51 AM  To page go to [email or web address]  [facility] -  Office  [phone]    See all Orders from today for further details    Objective:   Vitals:   04/13/20 0010 04/13/20 0400 04/13/20 0744 04/13/20 0951  BP:  110/78 117/77   Pulse:  78 80   Resp: 14 18    Temp:  98.4 F (36.9 C) 97.6 F (36.4 C)   TempSrc:  Oral Axillary   SpO2:  100% 94% 92%  Weight:  59 kg    Height:        Wt Readings from Last 3 Encounters:  04/13/20 59 kg  01/30/20 65.3 kg  12/05/19 66 kg     Intake/Output Summary (Last 24 hours) at 04/13/2020 1151 Last data filed at 04/13/2020 0900 Gross per  24 hour  Intake 120 ml  Output 1000 ml  Net -880 ml     Physical Exam  Awake, extremely frail, demented and confused, she is being fed by staff, appears to be having better appetite today. Symmetrical Chest wall movement, Good air movement bilaterally, CTAB RRR,No  Gallops,Rubs or new Murmurs, No Parasternal Heave +ve B.Sounds, Abd Soft, No tenderness, No rebound - guarding or rigidity. No Cyanosis, Clubbing or edema, No new Rash or bruise         Data Review:    CBC Recent Labs  Lab 04/07/20 0323 04/07/20 0323 04/08/20 0557 04/08/20 0557 04/09/20 0701 04/10/20 0254 04/11/20 0119 04/12/20 0043 04/13/20 0246  WBC 15.6*   < > 17.5*   < > 14.5* 19.6* 16.8* 17.4* 15.0*  HGB 13.1   < > 13.2   < > 11.9* 12.2 12.8 12.9 13.7  HCT 42.3   < > 42.1   < > 37.8 38.4 39.8 40.9 42.4  PLT 297   < > 312   < > 286 312 303 296 315  MCV 94.0   < > 94.4   < > 93.3 93.4 92.1 92.1 93.2  MCH 29.1   < > 29.6   < > 29.4 29.7 29.6 29.1 30.1  MCHC 31.0   < > 31.4   < > 31.5 31.8 32.2 31.5 32.3  RDW 14.0   < > 13.8   < > 13.8 13.8 13.7 13.8 14.5  LYMPHSABS 0.9  --  1.3  --  1.3  --   --   --   --   MONOABS 0.6  --  0.7  --  0.8  --   --   --   --   EOSABS 0.0  --  0.0  --  0.0  --   --   --   --   BASOSABS 0.0  --  0.0  --  0.0  --   --   --   --    < > = values in this interval not displayed.    Chemistries  Recent Labs  Lab 04/07/20 0323 04/07/20 0323 04/08/20 0557 04/08/20 0557 04/09/20 0701 04/10/20 0254 04/11/20 0119 04/12/20 0043 04/13/20 0246  NA 148*   < > 143   < > 141 137 138 136 134*  K 4.7   < > 4.7   < > 4.6 4.5 4.5 4.9 5.0  CL 104   < > 103   < > 106 102 103 102 100  CO2 34*   < > 28   < > 26 24 28 25 24   GLUCOSE 181*   < > 174*   < > 176* 227* 173* 182* 266*  BUN 17   < > 19   < > 18 18 19 17 16   CREATININE 0.74   < > 0.75   < > 0.69 0.58 0.63 0.58 0.64  CALCIUM 8.9   < > 8.7*   < > 8.3* 8.6* 8.5* 8.5* 8.4*  AST 19   < > 20   < > 19 18 16 18 17   ALT 19   < > 21    < > 22 24 21 24 25   ALKPHOS 53   < > 52   < > 48 48 50 46 48  BILITOT 0.7   < > 0.8   < > 0.6 0.5 0.8 0.3 0.6  MG 2.5*  --  2.4  --  2.3  --   --   --   --    < > = values in this interval not displayed.     ------------------------------------------------------------------------------------------------------------------ No results for input(s): CHOL, HDL, LDLCALC, TRIG, CHOLHDL, LDLDIRECT in the last 72 hours.  Lab Results  Component Value Date   HGBA1C  5.3 04/15/2017   ------------------------------------------------------------------------------------------------------------------ No results for input(s): TSH, T4TOTAL, T3FREE, THYROIDAB in the last 72 hours.  Invalid input(s): FREET3  Cardiac Enzymes No results for input(s): CKMB, TROPONINI, MYOGLOBIN in the last 168 hours.  Invalid input(s): CK ------------------------------------------------------------------------------------------------------------------    Component Value Date/Time   BNP 46.1 04/09/2020 0701    Micro Results No results found for this or any previous visit (from the past 240 hour(s)).  Radiology Reports DG Chest Port 1 View  Result Date: 04/07/2020 CLINICAL DATA:  Shortness of breath.  Reported COVID-19 positive. EXAM: PORTABLE CHEST 1 VIEW COMPARISON:  April 03, 2020 FINDINGS: There is persistent airspace consolidation in the left base with small left pleural effusion. There is slightly less airspace consolidation in the right base compared to recent study. There is ill-defined opacity in the right mid lung, stable. More subtle ill-defined opacity is noted in the left mid lung. Heart size and pulmonary vascular normal. No adenopathy. There is aortic atherosclerosis. No bone lesions. IMPRESSION: Areas of multifocal pneumonia with consolidation greatest in the left base region. Small left pleural effusion. Suspect atypical organism pneumonia as most likely etiology. Superimposed bacterial pneumonia in the  left base is possible. Stable cardiac silhouette. Aortic Atherosclerosis (ICD10-I70.0). Electronically Signed   By: Bretta Bang III M.D.   On: 04/07/2020 08:08   DG Chest Port 1 View  Result Date: 04/03/2020 CLINICAL DATA:  History of COVID infection EXAM: PORTABLE CHEST 1 VIEW COMPARISON:  April 02, 2020 FINDINGS: Images rotated to the LEFT. Accounting for this rotation cardiomediastinal contours are stable. Increasing opacity at the LEFT lung base compared to the prior study. Also with increased opacity at the RIGHT lung base. Air bronchograms are seen at the LEFT lung base on today's study as well. Exam is more similar to the study of August 15. Probable small LEFT effusion as before. On limited assessment skeletal structures are unremarkable. IMPRESSION: Bilateral opacities worse at the lung bases and worse in the LEFT lung base, similar to prior imaging in this patient with reported history of COVID-19 pneumonia. Electronically Signed   By: Donzetta Kohut M.D.   On: 04/03/2020 07:54   DG Chest Port 1 View  Result Date: 04/02/2020 CLINICAL DATA:  Acute respiratory failure with hypoxemia, COVID-19 EXAM: PORTABLE CHEST 1 VIEW COMPARISON:  Portable exam 0652 hours compared 03/30/2020 FINDINGS: Normal heart size, mediastinal contours, and pulmonary vascularity. Atherosclerotic calcification aorta. Scattered pulmonary infiltrates bilaterally consistent with multifocal pneumonia most confluent in the LEFT lower lobe. No pleural effusion or pneumothorax. Mild osseous demineralization. IMPRESSION: Persistent pulmonary infiltrates consistent with multifocal pneumonia and history of COVID-19, slightly improved. Electronically Signed   By: Ulyses Southward M.D.   On: 04/02/2020 08:34   DG Chest Port 1 View  Result Date: 03/30/2020 CLINICAL DATA:  Patient with history of COVID-19. Worsening shortness of breath. EXAM: PORTABLE CHEST 1 VIEW COMPARISON:  None. FINDINGS: Monitoring leads overlie the patient.  Enlarged cardiac and mediastinal contours. Diffuse bilateral airspace opacities. Probable small left pleural effusion. No pneumothorax. IMPRESSION: 1. Cardiomegaly. 2. Diffuse bilateral airspace opacities most compatible with multifocal pneumonia. Electronically Signed   By: Annia Belt M.D.   On: 03/30/2020 12:51   VAS [place] LOWER EXTREMITY VENOUS (DVT)  Result Date: 04/02/2020  Lower Venous DVTStudy Other Indications: Covid positive with elevated d-dimer. Performing Technologist: Marilynne Halsted RDMS, RVT  Examination Guidelines: A complete evaluation includes B-mode imaging, spectral Doppler, color Doppler, and power Doppler as needed of all accessible portions of each vessel. Bilateral  testing is considered an integral part of a complete examination. Limited examinations for reoccurring indications may be performed as noted. The reflux portion of the exam is performed with the patient in reverse Trendelenburg.  +---------+---------------+---------+-----------+----------+--------------+ RIGHT    CompressibilityPhasicitySpontaneityPropertiesThrombus Aging +---------+---------------+---------+-----------+----------+--------------+ CFV      Full           Yes      Yes                                 +---------+---------------+---------+-----------+----------+--------------+ SFJ      Full                                                        +---------+---------------+---------+-----------+----------+--------------+ FV Prox  Full                                                        +---------+---------------+---------+-----------+----------+--------------+ FV Mid   Full                                                        +---------+---------------+---------+-----------+----------+--------------+ FV DistalFull                                                        +---------+---------------+---------+-----------+----------+--------------+ PFV      Full                                                         +---------+---------------+---------+-----------+----------+--------------+ POP      Full           Yes      Yes                                 +---------+---------------+---------+-----------+----------+--------------+ PTV      Full                                                        +---------+---------------+---------+-----------+----------+--------------+ PERO     Full                                                        +---------+---------------+---------+-----------+----------+--------------+   +---------+---------------+---------+-----------+----------+--------------+ LEFT     CompressibilityPhasicitySpontaneityPropertiesThrombus Aging +---------+---------------+---------+-----------+----------+--------------+ CFV  Full           Yes      Yes                                 +---------+---------------+---------+-----------+----------+--------------+ SFJ      Full                                                        +---------+---------------+---------+-----------+----------+--------------+ FV Prox  Full                                                        +---------+---------------+---------+-----------+----------+--------------+ FV Mid   Full                                                        +---------+---------------+---------+-----------+----------+--------------+ FV DistalFull                                                        +---------+---------------+---------+-----------+----------+--------------+ PFV      Full                                                        +---------+---------------+---------+-----------+----------+--------------+ POP      Full           Yes      Yes                                 +---------+---------------+---------+-----------+----------+--------------+ PTV      Full                                                         +---------+---------------+---------+-----------+----------+--------------+ PERO     Full                                                        +---------+---------------+---------+-----------+----------+--------------+     Summary: BILATERAL: - No evidence of deep vein thrombosis seen in the lower extremities, bilaterally. -No evidence of popliteal cyst, bilaterally.   *See table(s) above for measurements and observations. Electronically signed by Gretta Began MD on 04/02/2020 at 5:04:02 PM.    Final    ECHOCARDIOGRAM LIMITED  Result Date: 03/31/2020  ECHOCARDIOGRAM LIMITED REPORT   Patient Name:   Christina Russell Date of Exam: 03/31/2020 Medical Rec #:  [id number]     Height:       62.0 in Accession #:    [id number]    Weight:       142.2 lb Date of Birth:  12/30/1950     BSA:          1.654 m Patient Age:    68 years      BP:           136/80 mmHg Patient Gender: F             HR:           86 bpm. Exam Location:  Inpatient Procedure: Limited Echo, Color Doppler and Cardiac Doppler Indications:    R06.9 DOE  History:        Patient has no prior history of Echocardiogram examinations.                 Risk Factors:Dyslipidemia.  Sonographer:    Irving Burton Senior RDCS Referring Phys: Humberto Seals  Sonographer Comments: COVID+ at time of study IMPRESSIONS  1. Abnormal septal motion with distal hypokinesis . Left ventricular ejection fraction, by estimation, is 50 to 55%. The left ventricle has low normal function. Left ventricular endocardial border not optimally defined to evaluate regional wall motion. Left ventricular diastolic parameters are indeterminate.  2. The mitral valve is normal in structure. Mild mitral valve regurgitation.  3. The aortic valve is tricuspid. Aortic valve regurgitation is not visualized. Mild aortic valve sclerosis is present, with no evidence of aortic valve stenosis.  4. There is normal pulmonary artery systolic pressure.  5. The inferior vena cava is normal in size with  greater than 50% respiratory variability, suggesting right atrial pressure of 3 mmHg. FINDINGS  Left Ventricle: Abnormal septal motion with distal hypokinesis. Left ventricular ejection fraction, by estimation, is 50 to 55%. The left ventricle has low normal function. Left ventricular endocardial border not optimally defined to evaluate regional wall motion. The left ventricular internal cavity size was normal in size. Right Ventricle: There is normal pulmonary artery systolic pressure. The tricuspid regurgitant velocity is 2.33 m/s, and with an assumed right atrial pressure of 3 mmHg, the estimated right ventricular systolic pressure is 24.7 mmHg. Mitral Valve: The mitral valve is normal in structure. There is mild thickening of the mitral valve leaflet(s). There is mild calcification of the mitral valve leaflet(s). Mild mitral annular calcification. Mild mitral valve regurgitation. Tricuspid Valve: The tricuspid valve is normal in structure. Tricuspid valve regurgitation is mild. Aortic Valve: The aortic valve is tricuspid. Aortic valve regurgitation is not visualized. Mild aortic valve sclerosis is present, with no evidence of aortic valve stenosis. Venous: The inferior vena cava is normal in size with greater than 50% respiratory variability, suggesting right atrial pressure of 3 mmHg. IAS/Shunts: The interatrial septum was not well visualized. RIGHT VENTRICLE RV S prime:     10.80 cm/s TAPSE (M-mode): 1.7 cm AORTIC VALVE LVOT Vmax:   95.30 cm/s LVOT Vmean:  65.900 cm/s LVOT VTI:    0.180 m MITRAL VALVE               TRICUSPID VALVE MV Area (PHT): 4.21 cm    TR Peak grad:   21.7 mmHg MV Decel Time: 180 msec    TR Vmax:        233.00 cm/s MV E velocity: 57.70 cm/s MV A  velocity: 73.20 cm/s  SHUNTS MV E/A ratio:  0.79        Systemic VTI: 0.18 m Charlton Haws MD Electronically signed by Charlton Haws MD Signature Date/Time: 03/31/2020/3:13:28 PM    Final

## 2020-04-14 LAB — CBC
HCT: 45.1 % (ref 36.0–46.0)
Hemoglobin: 14.3 g/dL (ref 12.0–15.0)
MCH: 29.4 pg (ref 26.0–34.0)
MCHC: 31.7 g/dL (ref 30.0–36.0)
MCV: 92.6 fL (ref 80.0–100.0)
Platelets: 323 10*3/uL (ref 150–400)
RBC: 4.87 MIL/uL (ref 3.87–5.11)
RDW: 14.6 % (ref 11.5–15.5)
WBC: 16.7 10*3/uL — ABNORMAL HIGH (ref 4.0–10.5)
nRBC: 0 % (ref 0.0–0.2)

## 2020-04-14 LAB — COMPREHENSIVE METABOLIC PANEL
ALT: 23 U/L (ref 0–44)
AST: 17 U/L (ref 15–41)
Albumin: 2.3 g/dL — ABNORMAL LOW (ref 3.5–5.0)
Alkaline Phosphatase: 50 U/L (ref 38–126)
Anion gap: 10 (ref 5–15)
BUN: 17 mg/dL (ref 8–23)
CO2: 27 mmol/L (ref 22–32)
Calcium: 8.5 mg/dL — ABNORMAL LOW (ref 8.9–10.3)
Chloride: 101 mmol/L (ref 98–111)
Creatinine, Ser: 0.59 mg/dL (ref 0.44–1.00)
GFR calc Af Amer: >60 mL/min (ref >60)
GFR calc non Af Amer: >60 mL/min (ref >60)
Glucose, Bld: 120 mg/dL — ABNORMAL HIGH (ref 70–99)
Potassium: 4 mmol/L (ref 3.5–5.1)
Sodium: 138 mmol/L (ref 135–145)
Total Bilirubin: 0.8 mg/dL (ref 0.3–1.2)
Total Protein: 6.9 g/dL (ref 6.5–8.1)

## 2020-04-14 LAB — D-DIMER, QUANTITATIVE: D-Dimer, Quant: 2.17 ug/mL-FEU — ABNORMAL HIGH (ref 0.00–0.50)

## 2020-04-14 NOTE — Progress Notes (Addendum)
Physical Therapy Treatment Patient Details Name: Christina Russell MRN: [id number] DOB: 1951-04-24 Today's Date: 04/14/2020    History of Present Illness 69 year old female who is not vaccinated for Covid and has underlying history of dementia, dyslipidemia who presented to the hospital with shortness of breath.  She was diagnosed with acute hypoxic respiratory failure due to COVID-19 pneumonia.    PT Comments    Pt received in recliner. Pt very anxious. On 30L HHFNC 80% FiO2. SpO2 85-90%. HR 130s. RR 30s-40s. Pt also with c/o L chest/rib pain. Active listening and relaxation cues offered to assist with bringing pt to more calm state. Performed LE exercises, requiring constant cues to stay on task as pt easily distracted. MD informed of pt's pain complaint. Unable to attempt standing due to anxiety/vitals.  Pt more calm at end of session. Assisted with repositioning in recliner.    Follow Up Recommendations  SNF     Equipment Recommendations  Other (comment) (TBD)    Recommendations for Other Services       Precautions / Restrictions Precautions Precautions: Fall;Other (comment) Precaution Comments: watch vitals    Mobility  Bed Mobility               General bed mobility comments: Pt received in recliner.  Transfers                    Ambulation/Gait                 Stairs             Wheelchair Mobility    Modified Rankin (Stroke Patients Only)       Balance                                            Cognition Arousal/Alertness: Awake/alert Behavior During Therapy: Anxious Overall Cognitive Status: No family/caregiver present to determine baseline cognitive functioning                                 General Comments: Dementia at baseline. Pt very anxious, requiring extensive cues for comfort/relaxation.      Exercises General Exercises - Lower Extremity Ankle Circles/Pumps: AROM;Both;5 reps Long  Arc Quad: AROM;Right;Left;5 reps Heel Slides: AROM;Right;Left;5 reps    General Comments        Pertinent Vitals/Pain Pain Assessment: Faces Faces Pain Scale: Hurts little more Pain Location: L ribs/chest Pain Descriptors / Indicators: Grimacing;Discomfort Pain Intervention(s): Limited activity within patient's tolerance;Monitored during session    Home Living                      Prior Function            PT Goals (current goals can now be found in the care plan section) Acute Rehab PT Goals Patient Stated Goal: home Progress towards PT goals: Progressing toward goals    Frequency    Min 2X/week      PT Plan Current plan remains appropriate    Co-evaluation              AM-PAC PT "6 Clicks" Mobility   Outcome Measure  Help needed turning from your back to your side while in a flat bed without using bedrails?: A Little Help needed moving from lying on your back to  sitting on the side of a flat bed without using bedrails?: A Lot Help needed moving to and from a bed to a chair (including a wheelchair)?: A Lot Help needed standing up from a chair using your arms (e.g., wheelchair or bedside chair)?: A Lot Help needed to walk in hospital room?: Total Help needed climbing 3-5 steps with a railing? : Total 6 Click Score: 11    End of Session Equipment Utilized During Treatment: Oxygen Activity Tolerance: Treatment limited secondary to medical complications (Comment) (anxiety, tachy) Patient left: in chair;with call bell/phone within reach;with chair alarm set Nurse Communication: Other (comment) (pain, vitals) PT Visit Diagnosis: Other abnormalities of gait and mobility (R26.89);Muscle weakness (generalized) (M62.81)     Time: 1000-1016 PT Time Calculation (min) (ACUTE ONLY): 16 min  Charges:  $Therapeutic Exercise: 8-22 mins                     Aida Raider, PT  Office # [phone] Pager [phone]    Ilda Foil 04/14/2020, 12:14  PM

## 2020-04-14 NOTE — Progress Notes (Signed)
PROGRESS NOTE                                                                                                                                                                                                             Patient Demographics:    Christina Russell, is a 69 y.o. female, DOB - [birth year], WUJ:[id number]  Outpatient Primary MD for the patient is [name], MD    LOS - 15  Admit date - 03/30/2020    Chief Complaint  Patient presents with  . Covid/ Distress  . COVID positive       Brief Narrative 69 year old African-American female who is not vaccinated for Covid and has underlying history of dementia, dyslipidemia who presented to the hospital with shortness of breath.  She was diagnosed with acute hypoxic respiratory failure due to COVID-19 pneumonia and admitted to ICU on heated high flow.  She also developed some CHF.  She was stabilized and subsequently transferred to my service on 04/02/2020.   Subjective:   Patient remains confused, no significant events as discussed with staff, her appetite has improved, she is sitting in a chair today.  She had some musculoskeletal chest pain reproducible by palpation today.   Assessment  & Plan :    Acute Hypoxic Resp. Failure due to Acute Covid 19 Viral Pneumonitis during the ongoing 2020 Covid 19 Pandemic . -unfortunately she is unvaccinated  - Has severe parenchymal injury,  -She is treated with IV steroids , pending her Solu-Medrol, should be tapered off over the next 48 hours. -Was treated with IV remdesivir . -She is been treated with  Baricitinib, did finish total of 14 days -She remains with significant oxygen requirement, initially requiring on HHFL 55 Lit, this has improved, she is currently on 30 L heated high flow nasal cannula, this remains significant . -She treated for aspiration pneumonia, 7 days of IV Unasyn . - She has very guarded diagnosis, she is  currently DNR., she is DNR if declines further comfort measures.  Palliative care also consulted and following.  Encouraged the patient to sit up in chair in the daytime use I-S and flutter valve for pulmonary toiletry.  Will advance activity and titrate down oxygen as possible.    SpO2: 91 % O2 Flow Rate (L/min): 30 L/min FiO2 (%): (S) 80 %    Recent  Labs  Lab 04/08/20 0557 04/08/20 0557 04/09/20 0701 04/09/20 0701 04/10/20 0254 04/11/20 0119 04/12/20 0043 04/13/20 0246 04/14/20 0324  WBC 17.5*   < > 14.5*   < > 19.6* 16.8* 17.4* 15.0* 16.7*  CRP 3.3*  --  1.4*  --   --   --   --   --   --   DDIMER 3.66*   < > 3.30*   < > 3.27* 3.16* 3.30* 8.72* 2.17*  BNP 42.7  --  46.1  --   --   --   --   --   --   PROCALCITON <0.10  --  <0.10  --   --   --   --   --   --   AST 20   < > 19   < > 18 16 18 17 17   ALT 21   < > 22   < > 24 21 24 25 23   ALKPHOS 52   < > 48   < > 48 50 46 48 50  BILITOT 0.8   < > 0.6   < > 0.5 0.8 0.3 0.6 0.8  ALBUMIN 2.1*   < > 1.9*   < > 1.9* 2.0* 2.0* 2.1* 2.3*   < > = values in this interval not displayed.    Aspiration pneumonia -Treated with IV unasyn -Followed by SLP, she remains on dysphagia 1 with nectar thick liquid  Extremely high D dimer - high risk for a clot -ve Leg , stable R Atrial pressure on TTE, on full dose anticoagulation held D-dimers <2.has high risk for thrombosis .  Underlying history of dementia/with acute delirium -At high risk for delirium, minimize narcotics and benzodiazepines, Currently does have mild toxic and metabolic encephalopathy on top of her dementia.  Did require 1 dose of Haldol yesterday. Dyslipidemia.  Continue home dose statin.  Acute on chronic diastolic heart failure with EF 50 to 55%. Outpatient cardiology follow-up for some distal septal hypokinesis, continue combination of aspirin, statin and beta-blocker for secondary prevention for now.  Diuresis on hold given some hyponatremia and evidence of volume  depletion on echo.   Hypernatremia -resolved with D5W  Possible aspiration pneumonia.  Speech following, on dysphagia 1 diet with nectar thick liquids, feeding assistance aspiration precautions, Unasyn.  MRSA nasal swab negative.     Condition - Extremely Guarded ++  Family Communication  : Daughter is hospitalized as well, and she has been updated daily.  Have updated granddaughter by phone.  Code Status : DNR  Consults  :  PCCM  Procedures  :    Leg - No DVT  TTE - 1. Abnormal septal motion with distal hypokinesis . Left ventricular ejection fraction, by estimation, is 50 to 55%. The left ventricle has low normal function. Left ventricular endocardial border not optimally defined to evaluate regional wall motion. Left ventricular diastolic parameters are indeterminate.  2. The mitral valve is normal in structure. Mild mitral valve regurgitation.  3. The aortic valve is tricuspid. Aortic valve regurgitation is not visualized. Mild aortic valve sclerosis is present, with no evidence of aortic valve stenosis.  4. There is normal pulmonary artery systolic pressure.  5. The inferior vena cava is normal in size with greater than 50% respiratory variability, suggesting right atrial pressure of 3 mmHg.   PUD Prophylaxis :  PPI  Disposition Plan  :    Status is: Inpatient  Remains inpatient appropriate because:IV treatments appropriate due to intensity of  illness or inability to take PO   Dispo: The patient is from: Home              Anticipated d/c is to: Home              Anticipated d/c date is: > 3 days              Patient currently is not medically stable to d/c.  DVT Prophylaxis  :  Lovenox   Lab Results  Component Value Date   PLT 323 04/14/2020    Diet :  Diet Order            DIET - DYS 1 Room service appropriate? No; Fluid consistency: Nectar Thick  Diet effective now                  Inpatient Medications  Scheduled Meds: . albuterol  2 puff Inhalation  TID  . vitamin C  500 mg Oral Daily  . atorvastatin  40 mg Oral Daily  . enoxaparin (LOVENOX) injection  1 mg/kg Subcutaneous Q12H  . mouth rinse  15 mL Mouth Rinse BID  . methylPREDNISolone (SOLU-MEDROL) injection  40 mg Intravenous Daily  . metoprolol tartrate  12.5 mg Oral BID  . pantoprazole  40 mg Oral Daily  . zinc sulfate  220 mg Oral Daily   Continuous Infusions: . sodium chloride Stopped (04/09/20 2034)   PRN Meds:.sodium chloride, acetaminophen, chlorpheniramine-HYDROcodone, docusate sodium, [DISCONTINUED] ondansetron **OR** ondansetron (ZOFRAN) IV, polyethylene glycol, Resource ThickenUp Clear  Antibiotics  :    Anti-infectives (From admission, onward)   Start     Dose/Rate Route Frequency Ordered Stop   04/03/20 1000  Ampicillin-Sulbactam (UNASYN) 3 g in sodium chloride 0.9 % 100 mL IVPB        3 g 200 mL/hr over 30 Minutes Intravenous Every 6 hours 04/03/20 0935 04/09/20 1900   03/31/20 1000  remdesivir 100 mg in sodium chloride 0.9 % 100 mL IVPB       "Followed by" Linked Group Details   100 mg 200 mL/hr over 30 Minutes Intravenous Daily 03/30/20 1445 04/03/20 0917   03/30/20 1445  remdesivir 200 mg in sodium chloride 0.9% 250 mL IVPB       "Followed by" Linked Group Details   200 mg 580 mL/hr over 30 Minutes Intravenous Once 03/30/20 1445 03/30/20 1714      Onedia Vargus M.D on 04/14/2020 at 2:29 PM  To page go to [email or web address]  [facility] -  Office  [phone]    See all Orders from today for further details    Objective:   Vitals:   04/14/20 0500 04/14/20 0738 04/14/20 0936 04/14/20 1100  BP:  108/80  127/76  Pulse:    88  Resp:      Temp:  98.1 F (36.7 C)  98 F (36.7 C)  TempSrc:  Oral  Oral  SpO2:   91%   Weight: 56.6 kg     Height:        Wt Readings from Last 3 Encounters:  04/14/20 56.6 kg  01/30/20 65.3 kg  12/05/19 66 kg     Intake/Output Summary (Last 24 hours) at 04/14/2020 1429 Last data filed at 04/14/2020  1333 Gross per 24 hour  Intake 360 ml  Output 700 ml  Net -340 ml     Physical Exam  Awake, extremely frail, demented and confused, she is being fed by staff, appears to be having better appetite today. Symmetrical Chest  wall movement, Good air movement bilaterally, CTAB RRR,No Gallops,Rubs or new Murmurs, No Parasternal Heave +ve B.Sounds, Abd Soft, No tenderness, No rebound - guarding or rigidity. No Cyanosis, Clubbing or edema, No new Rash or bruise         Data Review:    CBC Recent Labs  Lab 04/08/20 0557 04/08/20 0557 04/09/20 0701 04/09/20 0701 04/10/20 0254 04/11/20 0119 04/12/20 0043 04/13/20 0246 04/14/20 0324  WBC 17.5*   < > 14.5*   < > 19.6* 16.8* 17.4* 15.0* 16.7*  HGB 13.2   < > 11.9*   < > 12.2 12.8 12.9 13.7 14.3  HCT 42.1   < > 37.8   < > 38.4 39.8 40.9 42.4 45.1  PLT 312   < > 286   < > 312 303 296 315 323  MCV 94.4   < > 93.3   < > 93.4 92.1 92.1 93.2 92.6  MCH 29.6   < > 29.4   < > 29.7 29.6 29.1 30.1 29.4  MCHC 31.4   < > 31.5   < > 31.8 32.2 31.5 32.3 31.7  RDW 13.8   < > 13.8   < > 13.8 13.7 13.8 14.5 14.6  LYMPHSABS 1.3  --  1.3  --   --   --   --   --   --   MONOABS 0.7  --  0.8  --   --   --   --   --   --   EOSABS 0.0  --  0.0  --   --   --   --   --   --   BASOSABS 0.0  --  0.0  --   --   --   --   --   --    < > = values in this interval not displayed.    Chemistries  Recent Labs  Lab 04/08/20 0557 04/08/20 0557 04/09/20 0701 04/09/20 0701 04/10/20 0254 04/11/20 0119 04/12/20 0043 04/13/20 0246 04/14/20 0324  NA 143   < > 141   < > 137 138 136 134* 138  K 4.7   < > 4.6   < > 4.5 4.5 4.9 5.0 4.0  CL 103   < > 106   < > 102 103 102 100 101  CO2 28   < > 26   < > 24 28 25 24 27   GLUCOSE 174*   < > 176*   < > 227* 173* 182* 266* 120*  BUN 19   < > 18   < > 18 19 17 16 17   CREATININE 0.75   < > 0.69   < > 0.58 0.63 0.58 0.64 0.59  CALCIUM 8.7*   < > 8.3*   < > 8.6* 8.5* 8.5* 8.4* 8.5*  AST 20   < > 19   < > 18 16 18 17 17    ALT 21   < > 22   < > 24 21 24 25 23   ALKPHOS 52   < > 48   < > 48 50 46 48 50  BILITOT 0.8   < > 0.6   < > 0.5 0.8 0.3 0.6 0.8  MG 2.4  --  2.3  --   --   --   --   --   --    < > = values in this interval not displayed.     ------------------------------------------------------------------------------------------------------------------ No results for input(s): CHOL, HDL, LDLCALC,  TRIG, CHOLHDL, LDLDIRECT in the last 72 hours.  Lab Results  Component Value Date   HGBA1C 5.3 04/15/2017   ------------------------------------------------------------------------------------------------------------------ No results for input(s): TSH, T4TOTAL, T3FREE, THYROIDAB in the last 72 hours.  Invalid input(s): FREET3  Cardiac Enzymes No results for input(s): CKMB, TROPONINI, MYOGLOBIN in the last 168 hours.  Invalid input(s): CK ------------------------------------------------------------------------------------------------------------------    Component Value Date/Time   BNP 46.1 04/09/2020 0701    Micro Results No results found for this or any previous visit (from the past 240 hour(s)).  Radiology Reports DG Chest Port 1 View  Result Date: 04/07/2020 CLINICAL DATA:  Shortness of breath.  Reported COVID-19 positive. EXAM: PORTABLE CHEST 1 VIEW COMPARISON:  April 03, 2020 FINDINGS: There is persistent airspace consolidation in the left base with small left pleural effusion. There is slightly less airspace consolidation in the right base compared to recent study. There is ill-defined opacity in the right mid lung, stable. More subtle ill-defined opacity is noted in the left mid lung. Heart size and pulmonary vascular normal. No adenopathy. There is aortic atherosclerosis. No bone lesions. IMPRESSION: Areas of multifocal pneumonia with consolidation greatest in the left base region. Small left pleural effusion. Suspect atypical organism pneumonia as most likely etiology. Superimposed bacterial  pneumonia in the left base is possible. Stable cardiac silhouette. Aortic Atherosclerosis (ICD10-I70.0). Electronically Signed   By: Bretta Bang III M.D.   On: 04/07/2020 08:08   DG Chest Port 1 View  Result Date: 04/03/2020 CLINICAL DATA:  History of COVID infection EXAM: PORTABLE CHEST 1 VIEW COMPARISON:  April 02, 2020 FINDINGS: Images rotated to the LEFT. Accounting for this rotation cardiomediastinal contours are stable. Increasing opacity at the LEFT lung base compared to the prior study. Also with increased opacity at the RIGHT lung base. Air bronchograms are seen at the LEFT lung base on today's study as well. Exam is more similar to the study of August 15. Probable small LEFT effusion as before. On limited assessment skeletal structures are unremarkable. IMPRESSION: Bilateral opacities worse at the lung bases and worse in the LEFT lung base, similar to prior imaging in this patient with reported history of COVID-19 pneumonia. Electronically Signed   By: Donzetta Kohut M.D.   On: 04/03/2020 07:54   DG Chest Port 1 View  Result Date: 04/02/2020 CLINICAL DATA:  Acute respiratory failure with hypoxemia, COVID-19 EXAM: PORTABLE CHEST 1 VIEW COMPARISON:  Portable exam 0652 hours compared 03/30/2020 FINDINGS: Normal heart size, mediastinal contours, and pulmonary vascularity. Atherosclerotic calcification aorta. Scattered pulmonary infiltrates bilaterally consistent with multifocal pneumonia most confluent in the LEFT lower lobe. No pleural effusion or pneumothorax. Mild osseous demineralization. IMPRESSION: Persistent pulmonary infiltrates consistent with multifocal pneumonia and history of COVID-19, slightly improved. Electronically Signed   By: Ulyses Southward M.D.   On: 04/02/2020 08:34   DG Chest Port 1 View  Result Date: 03/30/2020 CLINICAL DATA:  Patient with history of COVID-19. Worsening shortness of breath. EXAM: PORTABLE CHEST 1 VIEW COMPARISON:  None. FINDINGS: Monitoring leads overlie  the patient. Enlarged cardiac and mediastinal contours. Diffuse bilateral airspace opacities. Probable small left pleural effusion. No pneumothorax. IMPRESSION: 1. Cardiomegaly. 2. Diffuse bilateral airspace opacities most compatible with multifocal pneumonia. Electronically Signed   By: Annia Belt M.D.   On: 03/30/2020 12:51   VAS [place] LOWER EXTREMITY VENOUS (DVT)  Result Date: 04/02/2020  Lower Venous DVTStudy Other Indications: Covid positive with elevated d-dimer. Performing Technologist: Marilynne Halsted RDMS, RVT  Examination Guidelines: A complete evaluation includes B-mode  imaging, spectral Doppler, color Doppler, and power Doppler as needed of all accessible portions of each vessel. Bilateral testing is considered an integral part of a complete examination. Limited examinations for reoccurring indications may be performed as noted. The reflux portion of the exam is performed with the patient in reverse Trendelenburg.  +---------+---------------+---------+-----------+----------+--------------+ RIGHT    CompressibilityPhasicitySpontaneityPropertiesThrombus Aging +---------+---------------+---------+-----------+----------+--------------+ CFV      Full           Yes      Yes                                 +---------+---------------+---------+-----------+----------+--------------+ SFJ      Full                                                        +---------+---------------+---------+-----------+----------+--------------+ FV Prox  Full                                                        +---------+---------------+---------+-----------+----------+--------------+ FV Mid   Full                                                        +---------+---------------+---------+-----------+----------+--------------+ FV DistalFull                                                        +---------+---------------+---------+-----------+----------+--------------+ PFV      Full                                                         +---------+---------------+---------+-----------+----------+--------------+ POP      Full           Yes      Yes                                 +---------+---------------+---------+-----------+----------+--------------+ PTV      Full                                                        +---------+---------------+---------+-----------+----------+--------------+ PERO     Full                                                        +---------+---------------+---------+-----------+----------+--------------+   +---------+---------------+---------+-----------+----------+--------------+  LEFT     CompressibilityPhasicitySpontaneityPropertiesThrombus Aging +---------+---------------+---------+-----------+----------+--------------+ CFV      Full           Yes      Yes                                 +---------+---------------+---------+-----------+----------+--------------+ SFJ      Full                                                        +---------+---------------+---------+-----------+----------+--------------+ FV Prox  Full                                                        +---------+---------------+---------+-----------+----------+--------------+ FV Mid   Full                                                        +---------+---------------+---------+-----------+----------+--------------+ FV DistalFull                                                        +---------+---------------+---------+-----------+----------+--------------+ PFV      Full                                                        +---------+---------------+---------+-----------+----------+--------------+ POP      Full           Yes      Yes                                 +---------+---------------+---------+-----------+----------+--------------+ PTV      Full                                                         +---------+---------------+---------+-----------+----------+--------------+ PERO     Full                                                        +---------+---------------+---------+-----------+----------+--------------+     Summary: BILATERAL: - No evidence of deep vein thrombosis seen in the lower extremities, bilaterally. -No evidence of popliteal cyst, bilaterally.   *See table(s) above for measurements and observations. Electronically signed by Gretta Began MD on 04/02/2020 at 5:04:02 PM.  Final    ECHOCARDIOGRAM LIMITED  Result Date: 03/31/2020    ECHOCARDIOGRAM LIMITED REPORT   Patient Name:   Christina Russell Date of Exam: 03/31/2020 Medical Rec #:  [id number]     Height:       62.0 in Accession #:    [id number]    Weight:       142.2 lb Date of Birth:  February 01, 1951     BSA:          1.654 m Patient Age:    68 years      BP:           136/80 mmHg Patient Gender: F             HR:           86 bpm. Exam Location:  Inpatient Procedure: Limited Echo, Color Doppler and Cardiac Doppler Indications:    R06.9 DOE  History:        Patient has no prior history of Echocardiogram examinations.                 Risk Factors:Dyslipidemia.  Sonographer:    Irving Burton Senior RDCS Referring Phys: Humberto Seals  Sonographer Comments: COVID+ at time of study IMPRESSIONS  1. Abnormal septal motion with distal hypokinesis . Left ventricular ejection fraction, by estimation, is 50 to 55%. The left ventricle has low normal function. Left ventricular endocardial border not optimally defined to evaluate regional wall motion. Left ventricular diastolic parameters are indeterminate.  2. The mitral valve is normal in structure. Mild mitral valve regurgitation.  3. The aortic valve is tricuspid. Aortic valve regurgitation is not visualized. Mild aortic valve sclerosis is present, with no evidence of aortic valve stenosis.  4. There is normal pulmonary artery systolic pressure.  5. The inferior vena cava is normal in size  with greater than 50% respiratory variability, suggesting right atrial pressure of 3 mmHg. FINDINGS  Left Ventricle: Abnormal septal motion with distal hypokinesis. Left ventricular ejection fraction, by estimation, is 50 to 55%. The left ventricle has low normal function. Left ventricular endocardial border not optimally defined to evaluate regional wall motion. The left ventricular internal cavity size was normal in size. Right Ventricle: There is normal pulmonary artery systolic pressure. The tricuspid regurgitant velocity is 2.33 m/s, and with an assumed right atrial pressure of 3 mmHg, the estimated right ventricular systolic pressure is 24.7 mmHg. Mitral Valve: The mitral valve is normal in structure. There is mild thickening of the mitral valve leaflet(s). There is mild calcification of the mitral valve leaflet(s). Mild mitral annular calcification. Mild mitral valve regurgitation. Tricuspid Valve: The tricuspid valve is normal in structure. Tricuspid valve regurgitation is mild. Aortic Valve: The aortic valve is tricuspid. Aortic valve regurgitation is not visualized. Mild aortic valve sclerosis is present, with no evidence of aortic valve stenosis. Venous: The inferior vena cava is normal in size with greater than 50% respiratory variability, suggesting right atrial pressure of 3 mmHg. IAS/Shunts: The interatrial septum was not well visualized. RIGHT VENTRICLE RV S prime:     10.80 cm/s TAPSE (M-mode): 1.7 cm AORTIC VALVE LVOT Vmax:   95.30 cm/s LVOT Vmean:  65.900 cm/s LVOT VTI:    0.180 m MITRAL VALVE               TRICUSPID VALVE MV Area (PHT): 4.21 cm    TR Peak grad:   21.7 mmHg MV Decel Time: 180 msec    TR Vmax:  233.00 cm/s MV E velocity: 57.70 cm/s MV A velocity: 73.20 cm/s  SHUNTS MV E/A ratio:  0.79        Systemic VTI: 0.18 m Charlton Haws MD Electronically signed by Charlton Haws MD Signature Date/Time: 03/31/2020/3:13:28 PM    Final

## 2020-04-14 NOTE — Plan of Care (Signed)
  Problem: Clinical Measurements: Goal: Ability to maintain clinical measurements within normal limits will improve Outcome: Progressing Goal: Will remain free from infection Outcome: Progressing Goal: Diagnostic test results will improve Outcome: Progressing Goal: Cardiovascular complication will be avoided Outcome: Progressing   Problem: Nutrition: Goal: Adequate nutrition will be maintained Outcome: Progressing   Problem: Elimination: Goal: Will not experience complications related to bowel motility Outcome: Progressing Goal: Will not experience complications related to urinary retention Outcome: Progressing   Problem: Respiratory: Goal: Will maintain a patent airway Outcome: Progressing   Problem: Education: Goal: Knowledge of General Education information will improve Description: Including pain rating scale, medication(s)/side effects and non-pharmacologic comfort measures Outcome: Not Progressing   Problem: Health Behavior/Discharge Planning: Goal: Ability to manage health-related needs will improve Outcome: Not Progressing   Problem: Clinical Measurements: Goal: Respiratory complications will improve Outcome: Not Progressing   Problem: Activity: Goal: Risk for activity intolerance will decrease Outcome: Not Progressing   Problem: Coping: Goal: Level of anxiety will decrease Outcome: Not Progressing   Problem: Education: Goal: Knowledge of risk factors and measures for prevention of condition will improve Outcome: Not Progressing   Problem: Coping: Goal: Psychosocial and spiritual needs will be supported Outcome: Not Progressing   Problem: Respiratory: Goal: Complications related to the disease process, condition or treatment will be avoided or minimized Outcome: Not Progressing   

## 2020-04-14 NOTE — Consult Note (Signed)
   [facility] CM Inpatient Consult   04/14/2020  Christina Russell 1951/02/26 [id number]   [facility] [[facility]]  Accountable Care Organization [ACO] Patient:  Medicare [employer]   Patient screened for long length of stay with COVID-19 and potential [facility]  [[facility]] Care Management service needs.  Review of patient's medical record reveals patient is being recommended for Skilled Nursing Facility [snf].   Primary Care Provider is  this provider is [name], MD at [facility] which is an Embedded practice listed to provide the transition of care [TOC] for post hospital follow up.   Plan: Spoke with inpatient [facility] team regarding disposition as patient's caregiver/daughter is hospitalized.  Continue to follow progress and disposition to assess for post hospital care management needs.    Please place a [facility] Care Management consult as appropriate and for questions contact:   Charlesetta Shanks, RN BSN CCM [facility]  [phone] business mobile phone Toll free office [phone]  Fax number: [phone] [email or web address]

## 2020-04-15 LAB — CBC
HCT: 41.9 % (ref 36.0–46.0)
Hemoglobin: 13.3 g/dL (ref 12.0–15.0)
MCH: 29.4 pg (ref 26.0–34.0)
MCHC: 31.7 g/dL (ref 30.0–36.0)
MCV: 92.5 fL (ref 80.0–100.0)
Platelets: 305 10*3/uL (ref 150–400)
RBC: 4.53 MIL/uL (ref 3.87–5.11)
RDW: 14.9 % (ref 11.5–15.5)
WBC: 13 10*3/uL — ABNORMAL HIGH (ref 4.0–10.5)
nRBC: 0.2 % (ref 0.0–0.2)

## 2020-04-15 LAB — BASIC METABOLIC PANEL
Anion gap: 9 (ref 5–15)
BUN: 16 mg/dL (ref 8–23)
CO2: 26 mmol/L (ref 22–32)
Calcium: 8.5 mg/dL — ABNORMAL LOW (ref 8.9–10.3)
Chloride: 104 mmol/L (ref 98–111)
Creatinine, Ser: 0.69 mg/dL (ref 0.44–1.00)
GFR calc Af Amer: >60 mL/min (ref >60)
GFR calc non Af Amer: >60 mL/min (ref >60)
Glucose, Bld: 154 mg/dL — ABNORMAL HIGH (ref 70–99)
Potassium: 4.7 mmol/L (ref 3.5–5.1)
Sodium: 139 mmol/L (ref 135–145)

## 2020-04-15 MED ORDER — ACETAMINOPHEN 325 MG PO TABS: 650.0000 mg | ORAL_TABLET | Freq: Three times a day (TID) | ORAL | Status: DC | PRN

## 2020-04-15 MED ORDER — DICLOFENAC SODIUM 1 % EX GEL
2.0000 g | Freq: Four times a day (QID) | CUTANEOUS | Status: DC
  Administered 2020-04-15 – 2020-04-20 (×19): 2 g via TOPICAL
  Filled 2020-04-15: qty 100

## 2020-04-15 MED ORDER — ACETAMINOPHEN 500 MG PO TABS
500.0000 mg | ORAL_TABLET | Freq: Three times a day (TID) | ORAL | Status: AC
  Administered 2020-04-15 – 2020-04-18 (×6): 500 mg via ORAL
  Filled 2020-04-15 (×6): qty 1

## 2020-04-15 MED ORDER — ENOXAPARIN SODIUM 30 MG/0.3ML ~~LOC~~ SOLN
30.0000 mg | Freq: Two times a day (BID) | SUBCUTANEOUS | Status: DC
  Administered 2020-04-15: 30 mg via SUBCUTANEOUS
  Filled 2020-04-15: qty 0.3

## 2020-04-15 NOTE — Progress Notes (Signed)
**Note De-Identified Christina Obfuscation** PROGRESS NOTE                                                                                                                                                                                                             Patient Demographics:    Christina Russell, is a 69 y.o. female, DOB - [birth year], ZOX:[id number]  Outpatient Primary MD for the patient is [name], MD    LOS - 16  Admit date - 03/30/2020    Chief Complaint  Patient presents with  . Covid/ Distress  . COVID positive       Brief Narrative 69 year old African-American female who is not vaccinated for Covid and has underlying history of dementia, dyslipidemia who presented to the hospital with shortness of breath.  She was diagnosed with acute hypoxic respiratory failure due to COVID-19 pneumonia and admitted to ICU on heated high flow.  She also developed some CHF.  She was stabilized and subsequently transferred to [facility] on 04/02/2020.   Subjective:   Patient remains confused, no significant events as discussed with staff, mentation remains confused, she is still having some reproducible musculoskeletal chest pain in the left upper chest .    Assessment  & Plan :    Acute Hypoxic Resp. Failure due to Acute Covid 19 Viral Pneumonitis during the ongoing 2020 Covid 19 Pandemic . -unfortunately she is unvaccinated  - Has severe parenchymal injury,  -She is treated with IV steroids , pending her Solu-Medrol, should be tapered off over the next 48 hours. -Was treated with IV remdesivir . -She is been treated with  Baricitinib, did finish total of 14 days -She remains with significant oxygen requirement, initially requiring on HHFL 55 Lit, this has improved, she is currently on 30 L heated high flow nasal cannula, this remains significant . -She treated for aspiration pneumonia, 7 days of IV Unasyn . - She has very guarded diagnosis, she is currently DNR.,  she is DNR if declines further comfort measures.  Palliative care also consulted and following.  Encouraged the patient to sit up in chair in the daytime use I-S and flutter valve for pulmonary toiletry.  Will advance activity and titrate down oxygen as possible.    SpO2: 90 % O2 Flow Rate (L/min): 30 L/min FiO2 (%): 80 %    Recent Labs  Lab 04/09/20  0701 04/09/20 0701 04/10/20 0254 04/10/20 0254 04/11/20 0119 04/12/20 0043 04/13/20 0246 04/14/20 0324 04/15/20 0152  WBC 14.5*   < > 19.6*   < > 16.8* 17.4* 15.0* 16.7* 13.0*  CRP 1.4*  --   --   --   --   --   --   --   --   DDIMER 3.30*   < > 3.27*  --  3.16* 3.30* 8.72* 2.17*  --   BNP 46.1  --   --   --   --   --   --   --   --   PROCALCITON <0.10  --   --   --   --   --   --   --   --   AST 19   < > 18  --  16 18 17 17   --   ALT 22   < > 24  --  21 24 25 23   --   ALKPHOS 48   < > 48  --  50 46 48 50  --   BILITOT 0.6   < > 0.5  --  0.8 0.3 0.6 0.8  --   ALBUMIN 1.9*   < > 1.9*  --  2.0* 2.0* 2.1* 2.3*  --    < > = values in this interval not displayed.    Aspiration pneumonia -Treated with IV unasyn -Followed by SLP, she remains on dysphagia 1 with nectar thick liquid  Extremely high D dimer - high risk for a clot -ve Leg , stable R Atrial pressure on TTE she is currently on 0.5 mg/kg every 12 hours, this can be changed to regular prophylaxis dose once her D-dimer<2.  Underlying history of dementia/with acute delirium -At high risk for delirium, minimize narcotics and benzodiazepines, Currently does have mild toxic and metabolic encephalopathy on top of her dementia.   -We will start on scheduled Tylenol.  Dyslipidemia.  Continue home dose statin.  Acute on chronic diastolic heart failure with EF 50 to 55%. Outpatient cardiology follow-up for some distal septal hypokinesis, continue combination of aspirin, statin and beta-blocker for secondary prevention for now.  Diuresis on hold given some hyponatremia and evidence  of volume depletion on echo.   Hypernatremia -resolved with D5W  Possible aspiration pneumonia.  Speech following, on dysphagia 1 diet with nectar thick liquids, feeding assistance aspiration precautions, Unasyn.  MRSA nasal swab negative.     Condition - Extremely Guarded ++  Family Communication  : Updated granddaughter by phone 8/80  Code Status : DNR  Consults  :  PCCM, palliative medicine.  Procedures  :    Leg [place] - No DVT  TTE - 1. Abnormal septal motion with distal hypokinesis . Left ventricular ejection fraction, by estimation, is 50 to 55%. The left ventricle has low normal function. Left ventricular endocardial border not optimally defined to evaluate regional wall motion. Left ventricular diastolic parameters are indeterminate.  2. The mitral valve is normal in structure. Mild mitral valve regurgitation.  3. The aortic valve is tricuspid. Aortic valve regurgitation is not visualized. Mild aortic valve sclerosis is present, with no evidence of aortic valve stenosis.  4. There is normal pulmonary artery systolic pressure.  5. The inferior vena cava is normal in size with greater than 50% respiratory variability, suggesting right atrial pressure of 3 mmHg.   PUD Prophylaxis :  PPI  Disposition Plan  :    Status is: Inpatient  Remains inpatient appropriate  because:IV treatments appropriate due to intensity of illness or inability to take PO   Dispo: The patient is from: Home              Anticipated d/c is to: Home              Anticipated d/c date is: > 3 days              Patient currently is not medically stable to d/c.  DVT Prophylaxis  :  Lovenox   Lab Results  Component Value Date   PLT 305 04/15/2020    Diet :  Diet Order            DIET - DYS 1 Room service appropriate? No; Fluid consistency: Nectar Thick  Diet effective now                  Inpatient Medications  Scheduled Meds: . albuterol  2 puff Inhalation TID  . vitamin C  500 mg Oral  Daily  . atorvastatin  40 mg Oral Daily  . enoxaparin (LOVENOX) injection  1 mg/kg Subcutaneous Q12H  . mouth rinse  15 mL Mouth Rinse BID  . metoprolol tartrate  12.5 mg Oral BID  . pantoprazole  40 mg Oral Daily  . zinc sulfate  220 mg Oral Daily   Continuous Infusions: . sodium chloride Stopped (04/09/20 2034)   PRN Meds:.sodium chloride, acetaminophen, chlorpheniramine-HYDROcodone, docusate sodium, [DISCONTINUED] ondansetron **OR** ondansetron (ZOFRAN) IV, polyethylene glycol, Resource ThickenUp Clear  Antibiotics  :    Anti-infectives (From admission, onward)   Start     Dose/Rate Route Frequency Ordered Stop   04/03/20 1000  Ampicillin-Sulbactam (UNASYN) 3 g in sodium chloride 0.9 % 100 mL IVPB        3 g 200 mL/hr over 30 Minutes Intravenous Every 6 hours 04/03/20 0935 04/09/20 1900   03/31/20 1000  remdesivir 100 mg in sodium chloride 0.9 % 100 mL IVPB       "Followed by" Linked Group Details   100 mg 200 mL/hr over 30 Minutes Intravenous Daily 03/30/20 1445 04/03/20 0917   03/30/20 1445  remdesivir 200 mg in sodium chloride 0.9% 250 mL IVPB       "Followed by" Linked Group Details   200 mg 580 mL/hr over 30 Minutes Intravenous Once 03/30/20 1445 03/30/20 1714      Zamarah Ullmer M.D on 04/15/2020 at 2:04 PM  To page go to [email or web address]  [facility] -  Office  [phone]    See all Orders from today for further details    Objective:   Vitals:   04/15/20 0350 04/15/20 0400 04/15/20 0818 04/15/20 1221  BP:  99/83  103/69  Pulse:  100  98  Resp:  18  20  Temp:  98.6 F (37 C) 98 F (36.7 C) 98 F (36.7 C)  TempSrc:  Oral Oral Oral  SpO2: 93% 94%  90%  Weight:  53.6 kg    Height:        Wt Readings from Last 3 Encounters:  04/15/20 53.6 kg  01/30/20 65.3 kg  12/05/19 66 kg     Intake/Output Summary (Last 24 hours) at 04/15/2020 1404 Last data filed at 04/15/2020 0900 Gross per 24 hour  Intake 240 ml  Output --  Net 240 ml      Physical Exam  Awake, demented and confused, no apparent distress  symmetrical Chest wall movement, Good air movement bilaterally, CTAB Tachycardic,No Gallops,Rubs or new Murmurs,  No Parasternal Heave +ve B.Sounds, Abd Soft, No tenderness, No rebound - guarding or rigidity. No Cyanosis, Clubbing or edema, No new Rash or bruise       Data Review:    CBC Recent Labs  Lab 04/09/20 0701 04/10/20 0254 04/11/20 0119 04/12/20 0043 04/13/20 0246 04/14/20 0324 04/15/20 0152  WBC 14.5*   < > 16.8* 17.4* 15.0* 16.7* 13.0*  HGB 11.9*   < > 12.8 12.9 13.7 14.3 13.3  HCT 37.8   < > 39.8 40.9 42.4 45.1 41.9  PLT 286   < > 303 296 315 323 305  MCV 93.3   < > 92.1 92.1 93.2 92.6 92.5  MCH 29.4   < > 29.6 29.1 30.1 29.4 29.4  MCHC 31.5   < > 32.2 31.5 32.3 31.7 31.7  RDW 13.8   < > 13.7 13.8 14.5 14.6 14.9  LYMPHSABS 1.3  --   --   --   --   --   --   MONOABS 0.8  --   --   --   --   --   --   EOSABS 0.0  --   --   --   --   --   --   BASOSABS 0.0  --   --   --   --   --   --    < > = values in this interval not displayed.    Chemistries  Recent Labs  Lab 04/09/20 0701 04/09/20 0701 04/10/20 0254 04/10/20 0254 04/11/20 0119 04/12/20 0043 04/13/20 0246 04/14/20 0324 04/15/20 0152  NA 141   < > 137   < > 138 136 134* 138 139  K 4.6   < > 4.5   < > 4.5 4.9 5.0 4.0 4.7  CL 106   < > 102   < > 103 102 100 101 104  CO2 26   < > 24   < > 28 25 24 27 26   GLUCOSE 176*   < > 227*   < > 173* 182* 266* 120* 154*  BUN 18   < > 18   < > 19 17 16 17 16   CREATININE 0.69   < > 0.58   < > 0.63 0.58 0.64 0.59 0.69  CALCIUM 8.3*   < > 8.6*   < > 8.5* 8.5* 8.4* 8.5* 8.5*  AST 19   < > 18  --  16 18 17 17   --   ALT 22   < > 24  --  21 24 25 23   --   ALKPHOS 48   < > 48  --  50 46 48 50  --   BILITOT 0.6   < > 0.5  --  0.8 0.3 0.6 0.8  --   MG 2.3  --   --   --   --   --   --   --   --    < > = values in this interval not displayed.      ------------------------------------------------------------------------------------------------------------------ No results for input(s): CHOL, HDL, LDLCALC, TRIG, CHOLHDL, LDLDIRECT in the last 72 hours.  Lab Results  Component Value Date   HGBA1C 5.3 04/15/2017   ------------------------------------------------------------------------------------------------------------------ No results for input(s): TSH, T4TOTAL, T3FREE, THYROIDAB in the last 72 hours.  Invalid input(s): FREET3  Cardiac Enzymes No results for input(s): CKMB, TROPONINI, MYOGLOBIN in the last 168 hours.  Invalid input(s): CK ------------------------------------------------------------------------------------------------------------------    Component Value Date/Time  BNP 46.1 04/09/2020 0701    Micro Results No results found for this or any previous visit (from the past 240 hour(s)).  Radiology Reports DG Chest Port 1 View  Result Date: 04/07/2020 CLINICAL DATA:  Shortness of breath.  Reported COVID-19 positive. EXAM: PORTABLE CHEST 1 VIEW COMPARISON:  April 03, 2020 FINDINGS: There is persistent airspace consolidation in the left base with small left pleural effusion. There is slightly less airspace consolidation in the right base compared to recent study. There is ill-defined opacity in the right mid lung, stable. More subtle ill-defined opacity is noted in the left mid lung. Heart size and pulmonary vascular normal. No adenopathy. There is aortic atherosclerosis. No bone lesions. IMPRESSION: Areas of multifocal pneumonia with consolidation greatest in the left base region. Small left pleural effusion. Suspect atypical organism pneumonia as most likely etiology. Superimposed bacterial pneumonia in the left base is possible. Stable cardiac silhouette. Aortic Atherosclerosis (ICD10-I70.0). Electronically Signed   By: Bretta Bang III M.D.   On: 04/07/2020 08:08   DG Chest Port 1 View  Result Date:  04/03/2020 CLINICAL DATA:  History of COVID infection EXAM: PORTABLE CHEST 1 VIEW COMPARISON:  April 02, 2020 FINDINGS: Images rotated to the LEFT. Accounting for this rotation cardiomediastinal contours are stable. Increasing opacity at the LEFT lung base compared to the prior study. Also with increased opacity at the RIGHT lung base. Air bronchograms are seen at the LEFT lung base on today's study as well. Exam is more similar to the study of August 15. Probable small LEFT effusion as before. On limited assessment skeletal structures are unremarkable. IMPRESSION: Bilateral opacities worse at the lung bases and worse in the LEFT lung base, similar to prior imaging in this patient with reported history of COVID-19 pneumonia. Electronically Signed   By: Donzetta Kohut M.D.   On: 04/03/2020 07:54   DG Chest Port 1 View  Result Date: 04/02/2020 CLINICAL DATA:  Acute respiratory failure with hypoxemia, COVID-19 EXAM: PORTABLE CHEST 1 VIEW COMPARISON:  Portable exam 0652 hours compared 03/30/2020 FINDINGS: Normal heart size, mediastinal contours, and pulmonary vascularity. Atherosclerotic calcification aorta. Scattered pulmonary infiltrates bilaterally consistent with multifocal pneumonia most confluent in the LEFT lower lobe. No pleural effusion or pneumothorax. Mild osseous demineralization. IMPRESSION: Persistent pulmonary infiltrates consistent with multifocal pneumonia and history of COVID-19, slightly improved. Electronically Signed   By: Ulyses Southward M.D.   On: 04/02/2020 08:34   DG Chest Port 1 View  Result Date: 03/30/2020 CLINICAL DATA:  Patient with history of COVID-19. Worsening shortness of breath. EXAM: PORTABLE CHEST 1 VIEW COMPARISON:  None. FINDINGS: Monitoring leads overlie the patient. Enlarged cardiac and mediastinal contours. Diffuse bilateral airspace opacities. Probable small left pleural effusion. No pneumothorax. IMPRESSION: 1. Cardiomegaly. 2. Diffuse bilateral airspace opacities most  compatible with multifocal pneumonia. Electronically Signed   By: Annia Belt M.D.   On: 03/30/2020 12:51   VAS [place] LOWER EXTREMITY VENOUS (DVT)  Result Date: 04/02/2020  Lower Venous DVTStudy Other Indications: Covid positive with elevated d-dimer. Performing Technologist: Marilynne Halsted RDMS, RVT  Examination Guidelines: A complete evaluation includes B-mode imaging, spectral Doppler, color Doppler, and power Doppler as needed of all accessible portions of each vessel. Bilateral testing is considered an integral part of a complete examination. Limited examinations for reoccurring indications may be performed as noted. The reflux portion of the exam is performed with the patient in reverse Trendelenburg.  +---------+---------------+---------+-----------+----------+--------------+ RIGHT    CompressibilityPhasicitySpontaneityPropertiesThrombus Aging +---------+---------------+---------+-----------+----------+--------------+ CFV      Full  Yes      Yes                                 +---------+---------------+---------+-----------+----------+--------------+ SFJ      Full                                                        +---------+---------------+---------+-----------+----------+--------------+ FV Prox  Full                                                        +---------+---------------+---------+-----------+----------+--------------+ FV Mid   Full                                                        +---------+---------------+---------+-----------+----------+--------------+ FV DistalFull                                                        +---------+---------------+---------+-----------+----------+--------------+ PFV      Full                                                        +---------+---------------+---------+-----------+----------+--------------+ POP      Full           Yes      Yes                                  +---------+---------------+---------+-----------+----------+--------------+ PTV      Full                                                        +---------+---------------+---------+-----------+----------+--------------+ PERO     Full                                                        +---------+---------------+---------+-----------+----------+--------------+   +---------+---------------+---------+-----------+----------+--------------+ LEFT     CompressibilityPhasicitySpontaneityPropertiesThrombus Aging +---------+---------------+---------+-----------+----------+--------------+ CFV      Full           Yes      Yes                                 +---------+---------------+---------+-----------+----------+--------------+ SFJ  Full                                                        +---------+---------------+---------+-----------+----------+--------------+ FV Prox  Full                                                        +---------+---------------+---------+-----------+----------+--------------+ FV Mid   Full                                                        +---------+---------------+---------+-----------+----------+--------------+ FV DistalFull                                                        +---------+---------------+---------+-----------+----------+--------------+ PFV      Full                                                        +---------+---------------+---------+-----------+----------+--------------+ POP      Full           Yes      Yes                                 +---------+---------------+---------+-----------+----------+--------------+ PTV      Full                                                        +---------+---------------+---------+-----------+----------+--------------+ PERO     Full                                                         +---------+---------------+---------+-----------+----------+--------------+     Summary: BILATERAL: - No evidence of deep vein thrombosis seen in the lower extremities, bilaterally. -No evidence of popliteal cyst, bilaterally.   *See table(s) above for measurements and observations. Electronically signed by Gretta Began MD on 04/02/2020 at 5:04:02 PM.    Final    ECHOCARDIOGRAM LIMITED  Result Date: 03/31/2020    ECHOCARDIOGRAM LIMITED REPORT   Patient Name:   Christina Russell Date of Exam: 03/31/2020 Medical Rec #:  [id number]     Height:       62.0 in Accession #:    [id number]    Weight:       142.2 lb Date of Birth:  [birth year]  BSA:          1.654 m Patient Age:    68 years      BP:           136/80 mmHg Patient Gender: F             HR:           86 bpm. Exam Location:  Inpatient Procedure: Limited Echo, Color Doppler and Cardiac Doppler Indications:    R06.9 DOE  History:        Patient has no prior history of Echocardiogram examinations.                 Risk Factors:Dyslipidemia.  Sonographer:    Irving Burton Senior RDCS Referring Phys: Humberto Seals  Sonographer Comments: COVID+ at time of study IMPRESSIONS  1. Abnormal septal motion with distal hypokinesis . Left ventricular ejection fraction, by estimation, is 50 to 55%. The left ventricle has low normal function. Left ventricular endocardial border not optimally defined to evaluate regional wall motion. Left ventricular diastolic parameters are indeterminate.  2. The mitral valve is normal in structure. Mild mitral valve regurgitation.  3. The aortic valve is tricuspid. Aortic valve regurgitation is not visualized. Mild aortic valve sclerosis is present, with no evidence of aortic valve stenosis.  4. There is normal pulmonary artery systolic pressure.  5. The inferior vena cava is normal in size with greater than 50% respiratory variability, suggesting right atrial pressure of 3 mmHg. FINDINGS  Left Ventricle: Abnormal septal motion with distal  hypokinesis. Left ventricular ejection fraction, by estimation, is 50 to 55%. The left ventricle has low normal function. Left ventricular endocardial border not optimally defined to evaluate regional wall motion. The left ventricular internal cavity size was normal in size. Right Ventricle: There is normal pulmonary artery systolic pressure. The tricuspid regurgitant velocity is 2.33 m/s, and with an assumed right atrial pressure of 3 mmHg, the estimated right ventricular systolic pressure is 24.7 mmHg. Mitral Valve: The mitral valve is normal in structure. There is mild thickening of the mitral valve leaflet(s). There is mild calcification of the mitral valve leaflet(s). Mild mitral annular calcification. Mild mitral valve regurgitation. Tricuspid Valve: The tricuspid valve is normal in structure. Tricuspid valve regurgitation is mild. Aortic Valve: The aortic valve is tricuspid. Aortic valve regurgitation is not visualized. Mild aortic valve sclerosis is present, with no evidence of aortic valve stenosis. Venous: The inferior vena cava is normal in size with greater than 50% respiratory variability, suggesting right atrial pressure of 3 mmHg. IAS/Shunts: The interatrial septum was not well visualized. RIGHT VENTRICLE RV S prime:     10.80 cm/s TAPSE (M-mode): 1.7 cm AORTIC VALVE LVOT Vmax:   95.30 cm/s LVOT Vmean:  65.900 cm/s LVOT VTI:    0.180 m MITRAL VALVE               TRICUSPID VALVE MV Area (PHT): 4.21 cm    TR Peak grad:   21.7 mmHg MV Decel Time: 180 msec    TR Vmax:        233.00 cm/s MV E velocity: 57.70 cm/s MV A velocity: 73.20 cm/s  SHUNTS MV E/A ratio:  0.79        Systemic VTI: 0.18 m Charlton Haws MD Electronically signed by Charlton Haws MD Signature Date/Time: 03/31/2020/3:13:28 PM    Final

## 2020-04-15 NOTE — Progress Notes (Signed)
Palliative care following for needs. Patient making slow progress but is limited by pain, anxiety and her baseline dementia status. Current goals are to continue with aggressive treatment of her Covid-19 infection with limits on intubation and resuscitation. Noted patient had pain with PT-recommend scheduling Tylenol q6 hours for pain to help with mobility and comfort. Available to discuss ongoing goals of care with family based on patient's progression. Please call with questions or palliative care needs.  Anderson Malta, DO Palliative Medicine [phone]

## 2020-04-15 NOTE — Plan of Care (Signed)
  Problem: Clinical Measurements: Goal: Ability to maintain clinical measurements within normal limits will improve Outcome: Progressing Goal: Will remain free from infection Outcome: Progressing Goal: Diagnostic test results will improve Outcome: Progressing Goal: Respiratory complications will improve Outcome: Progressing Goal: Cardiovascular complication will be avoided Outcome: Progressing   Problem: Nutrition: Goal: Adequate nutrition will be maintained Outcome: Progressing   Problem: Elimination: Goal: Will not experience complications related to bowel motility Outcome: Progressing Goal: Will not experience complications related to urinary retention Outcome: Progressing   Problem: Coping: Goal: Psychosocial and spiritual needs will be supported Outcome: Progressing   Problem: Respiratory: Goal: Will maintain a patent airway Outcome: Progressing Goal: Complications related to the disease process, condition or treatment will be avoided or minimized Outcome: Progressing   Problem: Education: Goal: Knowledge of General Education information will improve Description: Including pain rating scale, medication(s)/side effects and non-pharmacologic comfort measures Outcome: Not Progressing   Problem: Health Behavior/Discharge Planning: Goal: Ability to manage health-related needs will improve Outcome: Not Progressing   Problem: Activity: Goal: Risk for activity intolerance will decrease Outcome: Not Progressing   Problem: Coping: Goal: Level of anxiety will decrease Outcome: Not Progressing   Problem: Education: Goal: Knowledge of risk factors and measures for prevention of condition will improve Outcome: Not Progressing

## 2020-04-16 LAB — CBC
HCT: 42.3 % (ref 36.0–46.0)
Hemoglobin: 13.2 g/dL (ref 12.0–15.0)
MCH: 29.3 pg (ref 26.0–34.0)
MCHC: 31.2 g/dL (ref 30.0–36.0)
MCV: 93.8 fL (ref 80.0–100.0)
Platelets: 283 10*3/uL (ref 150–400)
RBC: 4.51 MIL/uL (ref 3.87–5.11)
RDW: 14.9 % (ref 11.5–15.5)
WBC: 11.3 10*3/uL — ABNORMAL HIGH (ref 4.0–10.5)
nRBC: 0 % (ref 0.0–0.2)

## 2020-04-16 LAB — BASIC METABOLIC PANEL
Anion gap: 11 (ref 5–15)
BUN: 15 mg/dL (ref 8–23)
CO2: 24 mmol/L (ref 22–32)
Calcium: 8.6 mg/dL — ABNORMAL LOW (ref 8.9–10.3)
Chloride: 106 mmol/L (ref 98–111)
Creatinine, Ser: 0.65 mg/dL (ref 0.44–1.00)
GFR calc Af Amer: >60 mL/min (ref >60)
GFR calc non Af Amer: >60 mL/min (ref >60)
Glucose, Bld: 176 mg/dL — ABNORMAL HIGH (ref 70–99)
Potassium: 4.5 mmol/L (ref 3.5–5.1)
Sodium: 141 mmol/L (ref 135–145)

## 2020-04-16 LAB — D-DIMER, QUANTITATIVE: D-Dimer, Quant: 1.21 ug/mL-FEU — ABNORMAL HIGH (ref 0.00–0.50)

## 2020-04-16 MED ORDER — ENOXAPARIN SODIUM 30 MG/0.3ML ~~LOC~~ SOLN
30.0000 mg | SUBCUTANEOUS | Status: DC
  Administered 2020-04-17 – 2020-04-19 (×3): 30 mg via SUBCUTANEOUS
  Filled 2020-04-16 (×3): qty 0.3

## 2020-04-16 MED ORDER — OXYMETAZOLINE HCL 0.05 % NA SOLN
3.0000 | Freq: Two times a day (BID) | NASAL | Status: AC
  Administered 2020-04-16 – 2020-04-17 (×3): 3 via NASAL
  Filled 2020-04-16: qty 30

## 2020-04-16 MED ORDER — QUETIAPINE FUMARATE 25 MG PO TABS
25.0000 mg | ORAL_TABLET | Freq: Every day | ORAL | Status: DC
  Administered 2020-04-16 – 2020-04-19 (×4): 25 mg via ORAL
  Filled 2020-04-16 (×4): qty 1

## 2020-04-16 MED ORDER — SALINE SPRAY 0.65 % NA SOLN
1.0000 | NASAL | Status: DC | PRN
  Filled 2020-04-16: qty 44

## 2020-04-16 NOTE — Progress Notes (Signed)
Patient has nasal congestion with bloody secretions and is not tolerating the HFNC.  Patient is on a NRM mask 15L, sats 93%.

## 2020-04-16 NOTE — Progress Notes (Signed)
Patient anxious, and is confused (as is her baseline).  Does not comprehend why she is here or time of day / year it is.  Has refused medication for me today.  Complains of a 'sore mouth', but won't allow me to sponge it for her.  Has pulled off oxygen many times.  On NRB at this time due to sats in 70's on 100%HF.  Anxiety has exacerbated her symptomatology with increased SOB, feeling of impending doom, tachycardia, and tachypnea.  Once her anxiety decreases, as when she is distracted, pulse slows to wnl, as does her respirations.  MD notified with suggestion of anti-anxiety agent.  Awaiting return call.

## 2020-04-16 NOTE — Progress Notes (Signed)
PROGRESS NOTE                                                                                                                                                                                                             Patient Demographics:    Christina Russell, is a 69 y.o. female, DOB - 11-12-1950, FTD:[id number]  Outpatient Primary MD for the patient is [name], MD   Admit date - 03/30/2020   LOS - 17  Chief Complaint  Patient presents with  . Covid/ Distress  . COVID positive       Brief Narrative: Patient is a 69 y.o. female with PMHx of HTN, HLD, dementia-tested positive for COVID-19 on 8/12-presented to the ED on 8/15 with worsening shortness of breath-she was diagnosed with Covid 19 pneumonia and acute hypoxic respiratory failure-she was initially admitted to the ICU-and required heated high flow.  She was lysed-and transferred to [facility] service on 8/18.  Hospital course complicated by persistent severe hypoxemia, aspiration pneumonia and features compatible with failure to thrive syndrome-see below for further details   COVID-19 vaccinated status: Unvaccinated  Significant Events: 8/15>> Admit for hypoxemia secondary to COVID-19 pneumonia 8/18>> transfer to [facility] from ICU  Significant studies: 8/15>>Chest x-ray: Diffuse bilateral airspace opacities-compatible with multifocal pneumonia 8/16>> Echo: EF 50-55% 8/18>> bilateral lower extremity Doppler: No DVT. 8/18>> chest x-ray: Persistent pulmonary infiltrates consistent with multifocal pneumonia 8/23>> chest x-ray: Areas of multifocal pneumonia with consolidation greatest in the left base region, small left pleural effusion  COVID-19 medications: Steroids: 8/15>> Remdesivir: 8/15>> 8/19 Baricitinib: 8/16>> 8/28  Antibiotics: Unasyn: 8/19>> 8/25  Microbiology data: 8/15 >>blood culture: No growth  Procedures: None  Consults: None  DVT  prophylaxis: enoxaparin (LOVENOX) injection 30 mg Start: 04/15/20 2000 SCDs Start: 03/30/20 2248     Subjective:    Jonetta Osgood today remains unchanged-still on HFNC 15 L-does not have an appetite.   Assessment  & Plan :   Acute Hypoxic Resp Failure due to Covid 19 Viral pneumonia: Continues to have severe hypoxemia-has completed a course of remdesivir, baricitinib.  Probably in the fibrotic stage of ARDS at this point.  Continue tapering steroids.  Her prognosis is poor and guarded-prior MD has consulted palliative care-plan is to continue to provide supportive care but if she were to significantly deteriorate-then to transition to comfort measures.  Fever: afebrile O2 requirements:  SpO2: 95 % O2 Flow Rate (L/min): 15 L/min FiO2 (%): 100 %   COVID-19 Labs: Recent Labs    04/14/20 0324 04/16/20 0140  DDIMER 2.17* 1.21*       Component Value Date/Time   BNP 46.1 04/09/2020 0701    No results for input(s): PROCALCITON in the last 168 hours.  Lab Results  Component Value Date   SARSCOV2NAA POSITIVE (A) 03/30/2020     Prone/Incentive Spirometry: encouraged  incentive spirometry use 3-4/hour.  Aspiration pneumonia: Finished a course of Unasyn-SLP following-on dysphagia 1 diet.  Elevated D-dimer: CTA chest/Doppler negative for VTE-D-dimer trending down-change Lovenox to daily dosing.  Acute on chronic diastolic heart failure: Seems reasonably compensated-diuretics on hold  Dementia with delirium: Appears relatively awake and alert-following some commands-maintain delirium precautions.  Avoid narcotics/benzodiazepines as much as possible-continue with supportive care.  HLD: Continue statin  Palliative care discussion: DNR in place-spoke to patient's granddaughter today at length-family is aware of patient's tenuous clinical status-and that if she declines-she would be appropriate for hospice.  GI prophylaxis: PPI  ABG:    Component Value Date/Time   PHART  7.468 (H) 03/30/2020 2104   PCO2ART 39.2 03/30/2020 2104   PO2ART 49 (L) 03/30/2020 2104   HCO3 28.4 (H) 03/30/2020 2104   TCO2 30 03/30/2020 2104   O2SAT 87.0 03/30/2020 2104    Vent Settings: N/A FiO2 (%):  [80 %-100 %] 100 %  Condition - Extremely Guarded  Family Communication  : Granddaughter (Rodricka [phone]) over the phone on 9/1  Code Status :  DNR  Diet :  Diet Order            DIET - DYS 1 Room service appropriate? No; Fluid consistency: Nectar Thick  Diet effective now                  Disposition Plan  :   Status is: Inpatient  Remains inpatient appropriate because:Inpatient level of care appropriate due to severity of illness   Dispo: The patient is from: Home              Anticipated d/c is to: Home              Anticipated d/c date is: > 3 days              Patient currently is not medically stable to d/c.    Barriers to discharge: Hypoxia requiring O2 supplementation  Antimicorbials  :    Anti-infectives (From admission, onward)   Start     Dose/Rate Route Frequency Ordered Stop   04/03/20 1000  Ampicillin-Sulbactam (UNASYN) 3 g in sodium chloride 0.9 % 100 mL IVPB        3 g 200 mL/hr over 30 Minutes Intravenous Every 6 hours 04/03/20 0935 04/09/20 1900   03/31/20 1000  remdesivir 100 mg in sodium chloride 0.9 % 100 mL IVPB       "Followed by" Linked Group Details   100 mg 200 mL/hr over 30 Minutes Intravenous Daily 03/30/20 1445 04/03/20 0917   03/30/20 1445  remdesivir 200 mg in sodium chloride 0.9% 250 mL IVPB       "Followed by" Linked Group Details   200 mg 580 mL/hr over 30 Minutes Intravenous Once 03/30/20 1445 03/30/20 1714      Inpatient Medications  Scheduled Meds: . acetaminophen  500 mg Oral TID  . albuterol  2 puff Inhalation TID  . vitamin C  500  mg Oral Daily  . atorvastatin  40 mg Oral Daily  . diclofenac Sodium  2 g Topical QID  . enoxaparin (LOVENOX) injection  30 mg Subcutaneous Q12H  . mouth rinse  15 mL  Mouth Rinse BID  . metoprolol tartrate  12.5 mg Oral BID  . pantoprazole  40 mg Oral Daily  . zinc sulfate  220 mg Oral Daily   Continuous Infusions: . sodium chloride Stopped (04/09/20 2034)   PRN Meds:.sodium chloride, acetaminophen, chlorpheniramine-HYDROcodone, docusate sodium, [DISCONTINUED] ondansetron **OR** ondansetron (ZOFRAN) IV, polyethylene glycol, Resource ThickenUp Clear   Time Spent in minutes  25  See all Orders from today for further details   Jeoffrey Massed M.D on 04/16/2020 at 10:23 AM  To page go to [email or web address] - use universal password  [facility] -  Office  [phone]    Objective:   Vitals:   04/16/20 0206 04/16/20 0400 04/16/20 0410 04/16/20 0946  BP:  100/66 (!) 111/59   Pulse: 96 86 83 (!) 101  Resp: 16 (!) 22 (!) 21 (!) 22  Temp:  97.6 F (36.4 C) 97.6 F (36.4 C)   TempSrc:  Axillary Axillary   SpO2: 92% 97% 90% 95%  Weight:      Height:        Wt Readings from Last 3 Encounters:  04/15/20 53.6 kg  01/30/20 65.3 kg  12/05/19 66 kg     Intake/Output Summary (Last 24 hours) at 04/16/2020 1023 Last data filed at 04/16/2020 1914 Gross per 24 hour  Intake 250 ml  Output 125 ml  Net 125 ml     Physical Exam Gen Exam:Alert awake-not in any distress-but looks chronically frail and sick appearing  HEENT:atraumatic, normocephalic Chest: B/L clear to auscultation anteriorly CVS:S1S2 regular Abdomen:soft non tender, non distended Extremities:no edema Neurology: Non focal-but with generalized weakness Skin: no rash   Data Review:    CBC Recent Labs  Lab 04/12/20 0043 04/13/20 0246 04/14/20 0324 04/15/20 0152 04/16/20 0140  WBC 17.4* 15.0* 16.7* 13.0* 11.3*  HGB 12.9 13.7 14.3 13.3 13.2  HCT 40.9 42.4 45.1 41.9 42.3  PLT 296 315 323 305 283  MCV 92.1 93.2 92.6 92.5 93.8  MCH 29.1 30.1 29.4 29.4 29.3  MCHC 31.5 32.3 31.7 31.7 31.2  RDW 13.8 14.5 14.6 14.9 14.9    Chemistries  Recent Labs  Lab 04/10/20 0254  04/10/20 0254 04/11/20 0119 04/11/20 0119 04/12/20 0043 04/13/20 0246 04/14/20 0324 04/15/20 0152 04/16/20 0140  NA 137   < > 138   < > 136 134* 138 139 141  K 4.5   < > 4.5   < > 4.9 5.0 4.0 4.7 4.5  CL 102   < > 103   < > 102 100 101 104 106  CO2 24   < > 28   < > GLUCOSE 227*   < > 173*   < > 182* 266* 120* 154* 176*  BUN 18   < > 19   < > CREATININE 0.58   < > 0.63   < > 0.58 0.64 0.59 0.69 0.65  CALCIUM 8.6*   < > 8.5*   < > 8.5* 8.4* 8.5* 8.5* 8.6*  AST 18  --  16  --  --   --   ALT 24  --  21  --  --   --   [facility]  48  --  50  --  46 48 50  --   --   BILITOT 0.5  --  0.8  --  0.3 0.6 0.8  --   --    < > = values in this interval not displayed.   ------------------------------------------------------------------------------------------------------------------ No results for input(s): CHOL, HDL, LDLCALC, TRIG, CHOLHDL, LDLDIRECT in the last 72 hours.  Lab Results  Component Value Date   HGBA1C 5.3 04/15/2017   ------------------------------------------------------------------------------------------------------------------ No results for input(s): TSH, T4TOTAL, T3FREE, THYROIDAB in the last 72 hours.  Invalid input(s): FREET3 ------------------------------------------------------------------------------------------------------------------ No results for input(s): VITAMINB12, FOLATE, FERRITIN, TIBC, IRON, RETICCTPCT in the last 72 hours.  Coagulation profile No results for input(s): INR, PROTIME in the last 168 hours.  Recent Labs    04/14/20 0324 04/16/20 0140  DDIMER 2.17* 1.21*    Cardiac Enzymes No results for input(s): CKMB, TROPONINI, MYOGLOBIN in the last 168 hours.  Invalid input(s): CK ------------------------------------------------------------------------------------------------------------------    Component Value Date/Time   BNP 46.1 04/09/2020 0701    Micro Results No results found for this  or any previous visit (from the past 240 hour(s)).  Radiology Reports DG Chest Port 1 View  Result Date: 04/07/2020 CLINICAL DATA:  Shortness of breath.  Reported COVID-19 positive. EXAM: PORTABLE CHEST 1 VIEW COMPARISON:  April 03, 2020 FINDINGS: There is persistent airspace consolidation in the left base with small left pleural effusion. There is slightly less airspace consolidation in the right base compared to recent study. There is ill-defined opacity in the right mid lung, stable. More subtle ill-defined opacity is noted in the left mid lung. Heart size and pulmonary vascular normal. No adenopathy. There is aortic atherosclerosis. No bone lesions. IMPRESSION: Areas of multifocal pneumonia with consolidation greatest in the left base region. Small left pleural effusion. Suspect atypical organism pneumonia as most likely etiology. Superimposed bacterial pneumonia in the left base is possible. Stable cardiac silhouette. Aortic Atherosclerosis (ICD10-I70.0). Electronically Signed   By: Bretta Bang III M.D.   On: 04/07/2020 08:08   DG Chest Port 1 View  Result Date: 04/03/2020 CLINICAL DATA:  History of COVID infection EXAM: PORTABLE CHEST 1 VIEW COMPARISON:  April 02, 2020 FINDINGS: Images rotated to the LEFT. Accounting for this rotation cardiomediastinal contours are stable. Increasing opacity at the LEFT lung base compared to the prior study. Also with increased opacity at the RIGHT lung base. Air bronchograms are seen at the LEFT lung base on today's study as well. Exam is more similar to the study of August 15. Probable small LEFT effusion as before. On limited assessment skeletal structures are unremarkable. IMPRESSION: Bilateral opacities worse at the lung bases and worse in the LEFT lung base, similar to prior imaging in this patient with reported history of COVID-19 pneumonia. Electronically Signed   By: Donzetta Kohut M.D.   On: 04/03/2020 07:54   DG Chest Port 1 View  Result Date:  04/02/2020 CLINICAL DATA:  Acute respiratory failure with hypoxemia, COVID-19 EXAM: PORTABLE CHEST 1 VIEW COMPARISON:  Portable exam 0652 hours compared 03/30/2020 FINDINGS: Normal heart size, mediastinal contours, and pulmonary vascularity. Atherosclerotic calcification aorta. Scattered pulmonary infiltrates bilaterally consistent with multifocal pneumonia most confluent in the LEFT lower lobe. No pleural effusion or pneumothorax. Mild osseous demineralization. IMPRESSION: Persistent pulmonary infiltrates consistent with multifocal pneumonia and history of COVID-19, slightly improved. Electronically Signed   By: Ulyses Southward M.D.   On: 04/02/2020 08:34   DG Chest Port 1 View  Result Date: 03/30/2020 CLINICAL DATA:  Patient with history of COVID-19. Worsening shortness of breath. EXAM: PORTABLE CHEST 1 VIEW COMPARISON:  None. FINDINGS: Monitoring leads overlie the patient. Enlarged cardiac and mediastinal contours. Diffuse bilateral airspace opacities. Probable small left pleural effusion. No pneumothorax. IMPRESSION: 1. Cardiomegaly. 2. Diffuse bilateral airspace opacities most compatible with multifocal pneumonia. Electronically Signed   By: Annia Belt M.D.   On: 03/30/2020 12:51   VAS [place] LOWER EXTREMITY VENOUS (DVT)  Result Date: 04/02/2020  Lower Venous DVTStudy Other Indications: Covid positive with elevated d-dimer. Performing Technologist: Marilynne Halsted RDMS, RVT  Examination Guidelines: A complete evaluation includes B-mode imaging, spectral Doppler, color Doppler, and power Doppler as needed of all accessible portions of each vessel. Bilateral testing is considered an integral part of a complete examination. Limited examinations for reoccurring indications may be performed as noted. The reflux portion of the exam is performed with the patient in reverse Trendelenburg.  +---------+---------------+---------+-----------+----------+--------------+ RIGHT     CompressibilityPhasicitySpontaneityPropertiesThrombus Aging +---------+---------------+---------+-----------+----------+--------------+ CFV      Full           Yes      Yes                                 +---------+---------------+---------+-----------+----------+--------------+ SFJ      Full                                                        +---------+---------------+---------+-----------+----------+--------------+ FV Prox  Full                                                        +---------+---------------+---------+-----------+----------+--------------+ FV Mid   Full                                                        +---------+---------------+---------+-----------+----------+--------------+ FV DistalFull                                                        +---------+---------------+---------+-----------+----------+--------------+ PFV      Full                                                        +---------+---------------+---------+-----------+----------+--------------+ POP      Full           Yes      Yes                                 +---------+---------------+---------+-----------+----------+--------------+ PTV      Full                                                        +---------+---------------+---------+-----------+----------+--------------+  PERO     Full                                                        +---------+---------------+---------+-----------+----------+--------------+   +---------+---------------+---------+-----------+----------+--------------+ LEFT     CompressibilityPhasicitySpontaneityPropertiesThrombus Aging +---------+---------------+---------+-----------+----------+--------------+ CFV      Full           Yes      Yes                                 +---------+---------------+---------+-----------+----------+--------------+ SFJ      Full                                                         +---------+---------------+---------+-----------+----------+--------------+ FV Prox  Full                                                        +---------+---------------+---------+-----------+----------+--------------+ FV Mid   Full                                                        +---------+---------------+---------+-----------+----------+--------------+ FV DistalFull                                                        +---------+---------------+---------+-----------+----------+--------------+ PFV      Full                                                        +---------+---------------+---------+-----------+----------+--------------+ POP      Full           Yes      Yes                                 +---------+---------------+---------+-----------+----------+--------------+ PTV      Full                                                        +---------+---------------+---------+-----------+----------+--------------+ PERO     Full                                                        +---------+---------------+---------+-----------+----------+--------------+  Summary: BILATERAL: - No evidence of deep vein thrombosis seen in the lower extremities, bilaterally. -No evidence of popliteal cyst, bilaterally.   *See table(s) above for measurements and observations. Electronically signed by Gretta Began MD on 04/02/2020 at 5:04:02 PM.    Final    ECHOCARDIOGRAM LIMITED  Result Date: 03/31/2020    ECHOCARDIOGRAM LIMITED REPORT   Patient Name:   ADELIZ TONKINSON Date of Exam: 03/31/2020 Medical Rec #:  [id number]     Height:       62.0 in Accession #:    [id number]    Weight:       142.2 lb Date of Birth:  November 02, 1950     BSA:          1.654 m Patient Age:    68 years      BP:           136/80 mmHg Patient Gender: F             HR:           86 bpm. Exam Location:  Inpatient Procedure: Limited Echo, Color Doppler and Cardiac Doppler Indications:     R06.9 DOE  History:        Patient has no prior history of Echocardiogram examinations.                 Risk Factors:Dyslipidemia.  Sonographer:    Irving Burton Senior RDCS Referring Phys: Humberto Seals  Sonographer Comments: COVID+ at time of study IMPRESSIONS  1. Abnormal septal motion with distal hypokinesis . Left ventricular ejection fraction, by estimation, is 50 to 55%. The left ventricle has low normal function. Left ventricular endocardial border not optimally defined to evaluate regional wall motion. Left ventricular diastolic parameters are indeterminate.  2. The mitral valve is normal in structure. Mild mitral valve regurgitation.  3. The aortic valve is tricuspid. Aortic valve regurgitation is not visualized. Mild aortic valve sclerosis is present, with no evidence of aortic valve stenosis.  4. There is normal pulmonary artery systolic pressure.  5. The inferior vena cava is normal in size with greater than 50% respiratory variability, suggesting right atrial pressure of 3 mmHg. FINDINGS  Left Ventricle: Abnormal septal motion with distal hypokinesis. Left ventricular ejection fraction, by estimation, is 50 to 55%. The left ventricle has low normal function. Left ventricular endocardial border not optimally defined to evaluate regional wall motion. The left ventricular internal cavity size was normal in size. Right Ventricle: There is normal pulmonary artery systolic pressure. The tricuspid regurgitant velocity is 2.33 m/s, and with an assumed right atrial pressure of 3 mmHg, the estimated right ventricular systolic pressure is 24.7 mmHg. Mitral Valve: The mitral valve is normal in structure. There is mild thickening of the mitral valve leaflet(s). There is mild calcification of the mitral valve leaflet(s). Mild mitral annular calcification. Mild mitral valve regurgitation. Tricuspid Valve: The tricuspid valve is normal in structure. Tricuspid valve regurgitation is mild. Aortic Valve: The aortic  valve is tricuspid. Aortic valve regurgitation is not visualized. Mild aortic valve sclerosis is present, with no evidence of aortic valve stenosis. Venous: The inferior vena cava is normal in size with greater than 50% respiratory variability, suggesting right atrial pressure of 3 mmHg. IAS/Shunts: The interatrial septum was not well visualized. RIGHT VENTRICLE RV S prime:     10.80 cm/s TAPSE (M-mode): 1.7 cm AORTIC VALVE LVOT Vmax:   95.30 cm/s LVOT Vmean:  65.900 cm/s LVOT VTI:    0.180 m MITRAL  VALVE               TRICUSPID VALVE MV Area (PHT): 4.21 cm    TR Peak grad:   21.7 mmHg MV Decel Time: 180 msec    TR Vmax:        233.00 cm/s MV E velocity: 57.70 cm/s MV A velocity: 73.20 cm/s  SHUNTS MV E/A ratio:  0.79        Systemic VTI: 0.18 m Charlton Haws MD Electronically signed by Charlton Haws MD Signature Date/Time: 03/31/2020/3:13:28 PM    Final

## 2020-04-16 NOTE — Progress Notes (Signed)
  Speech Language Pathology Treatment: Dysphagia  Patient Details Name: Christina Russell MRN: [id number] DOB: [birth year] Today's Date: 04/16/2020 Time: 4098-1191 SLP Time Calculation (min) (ACUTE ONLY): 16 min  Assessment / Plan / Recommendation Clinical Impression  Plan was trials of thin and Dys 2 texture but not appropriate today. Her HR up to 138 max briefly, RR to 55 (ranged 27-55) and SpO2 86%-91%). She started to cry saying "I just want to go home and be alone, I want to be happy. I'm tired." When asked she said she does want to be alive. Once vitals decreased, she consumed 2 sips nectar juice. Prior to sips she had wet cough (fluttery respirations)- RT noted nasal congestions with bloody secretions. Continue on modified diet of puree (D1), nectar thick liquids, crush meds rest breaks and downgraded frequency for 1/week. Uncertain of her prognosis especially in regards to swallow/intake.   HPI HPI: 69 year old African-American female unvaccinated with history of dementia, dyslipidemia who presented to the hospital with shortness of breath.  She was diagnosed with acute hypoxic respiratory failure due to COVID-19 pneumonia and admitted to ICU on heated high flow. Per MD note has developed some CHF.        SLP Plan  Continue with current plan of care       Recommendations  Diet recommendations: Dysphagia 1 (puree);Nectar-thick liquid Liquids provided via: Cup;No straw Medication Administration: Crushed with puree Supervision: Staff to assist with self feeding;Full supervision/cueing for compensatory strategies Compensations: Slow rate;Minimize environmental distractions;Small sips/bites;Lingual sweep for clearance of pocketing Postural Changes and/or Swallow Maneuvers: Seated upright 90 degrees                Oral Care Recommendations: Oral care BID Follow up Recommendations: Skilled Nursing facility;24 hour supervision/assistance SLP Visit Diagnosis: Dysphagia, unspecified  (R13.10) Plan: Continue with current plan of care                       Royce Macadamia 04/16/2020, 11:14 AM  Breck Coons Lonell Face.Ed Nurse, children's [phone] Office [phone]

## 2020-04-17 LAB — CBC
HCT: 41.8 % (ref 36.0–46.0)
Hemoglobin: 13.1 g/dL (ref 12.0–15.0)
MCH: 29.8 pg (ref 26.0–34.0)
MCHC: 31.3 g/dL (ref 30.0–36.0)
MCV: 95.2 fL (ref 80.0–100.0)
Platelets: 243 10*3/uL (ref 150–400)
RBC: 4.39 MIL/uL (ref 3.87–5.11)
RDW: 15.1 % (ref 11.5–15.5)
WBC: 10.4 10*3/uL (ref 4.0–10.5)
nRBC: 0 % (ref 0.0–0.2)

## 2020-04-17 LAB — GLUCOSE, CAPILLARY
Glucose-Capillary: 113 mg/dL — ABNORMAL HIGH (ref 70–99)
Glucose-Capillary: 110 mg/dL — ABNORMAL HIGH (ref 70–99)
Glucose-Capillary: 108 mg/dL — ABNORMAL HIGH (ref 70–99)

## 2020-04-17 LAB — BASIC METABOLIC PANEL
Anion gap: 11 (ref 5–15)
BUN: 12 mg/dL (ref 8–23)
CO2: 24 mmol/L (ref 22–32)
Calcium: 8.4 mg/dL — ABNORMAL LOW (ref 8.9–10.3)
Chloride: 106 mmol/L (ref 98–111)
Creatinine, Ser: 0.6 mg/dL (ref 0.44–1.00)
GFR calc Af Amer: >60 mL/min (ref >60)
GFR calc non Af Amer: >60 mL/min (ref >60)
Glucose, Bld: 146 mg/dL — ABNORMAL HIGH (ref 70–99)
Potassium: 4.3 mmol/L (ref 3.5–5.1)
Sodium: 141 mmol/L (ref 135–145)

## 2020-04-17 LAB — D-DIMER, QUANTITATIVE: D-Dimer, Quant: 2.29 ug/mL-FEU — ABNORMAL HIGH (ref 0.00–0.50)

## 2020-04-17 NOTE — Progress Notes (Signed)
PROGRESS NOTE                                                                                                                                                                                                             Patient Demographics:    Christina Russell, is a 69 y.o. female, DOB - May 31, 1951, UJW:[id number]  Outpatient Primary MD for the patient is [name], MD   Admit date - 03/30/2020   LOS - 18  Chief Complaint  Patient presents with  . Covid/ Distress  . COVID positive       Brief Narrative: Patient is a 68 y.o. female with PMHx of HTN, HLD, dementia-tested positive for COVID-19 on 8/12-presented to the ED on 8/15 with worsening shortness of breath-she was diagnosed with Covid 19 pneumonia and acute hypoxic respiratory failure-she was initially admitted to the ICU-and required heated high flow.  She was lysed-and transferred to [facility] service on 8/18.  Hospital course complicated by persistent severe hypoxemia, aspiration pneumonia and features compatible with failure to thrive syndrome-see below for further details  COVID-19 vaccinated status: Unvaccinated  Significant Events: 8/15>> Admit for hypoxemia secondary to COVID-19 pneumonia 8/18>> transfer to [facility] from ICU  Significant studies: 8/15>>Chest x-ray: Diffuse bilateral airspace opacities-compatible with multifocal pneumonia 8/16>> Echo: EF 50-55% 8/18>> bilateral lower extremity Doppler: No DVT. 8/18>> chest x-ray: Persistent pulmonary infiltrates consistent with multifocal pneumonia 8/23>> chest x-ray: Areas of multifocal pneumonia with consolidation greatest in the left base region, small left pleural effusion  COVID-19 medications: Steroids: 8/15>> Remdesivir: 8/15>> 8/19 Baricitinib: 8/16>> 8/28  Antibiotics: Unasyn: 8/19>> 8/25  Microbiology data: 8/15 >>blood culture: No growth  Procedures: None  Consults: None  DVT  prophylaxis: enoxaparin (LOVENOX) injection 30 mg Start: 04/17/20 0800 SCDs Start: 03/30/20 2248     Subjective:   Some confusion yesterday evening-started on Seroquel.  This morning-appears comfortable-still on nonrebreather mask.  No other issues overnight.   Assessment  & Plan :   Acute Hypoxic Resp Failure due to Covid 19 Viral pneumonia: Continues to have severe hypoxemia-has completed a course of s remdesivir and baricitinib.  On tapering steroids.  Probably in the fibrotic stage of ARDS-continue supportive care.  She appears comfortable-and her clinical trajectory has plateaued at this level for more than a week now.  Continue attempts to titrate down FiO2.   Fever:  afebrile O2 requirements:  SpO2: 100 % O2 Flow Rate (L/min): 15 L/min FiO2 (%): 100 %   COVID-19 Labs: Recent Labs    04/16/20 0140 04/17/20 1050  DDIMER 1.21* 2.29*       Component Value Date/Time   BNP 46.1 04/09/2020 0701    No results for input(s): PROCALCITON in the last 168 hours.  Lab Results  Component Value Date   SARSCOV2NAA POSITIVE (A) 03/30/2020     Prone/Incentive Spirometry: encouraged  incentive spirometry use 3-4/hour.  Aspiration pneumonia: Finished a course of Unasyn-SLP following-on dysphagia 1 diet.  Elevated D-dimer: CTA chest/Doppler negative for VTE-D-dimer trending down-change Lovenox to daily dosing.  Acute on chronic diastolic heart failure: Seems reasonably compensated-diuretics on hold  Dementia with delirium: Appears relatively awake and alert-following some commands-maintain delirium precautions.  Avoid narcotics/benzodiazepines as much as possible-continue with supportive care.  Due to intermittent delirium-mostly in the evening-have started low-dose Seroquel.  Continue to follow and optimize.  HLD: Continue statin  Palliative care discussion: Her prognosis is guarded-this MD and other prior MDs have spoken with family-palliative care has been following.  Plan is  to continue to support her as much as possible-and if she were to deteriorate significantly-they are aware that apart from transitioning to hospice care we really do not have any other options.  This was reconfirmed with granddaughter this morning by me.  GI prophylaxis: PPI  ABG:    Component Value Date/Time   PHART 7.468 (H) 03/30/2020 2104   PCO2ART 39.2 03/30/2020 2104   PO2ART 49 (L) 03/30/2020 2104   HCO3 28.4 (H) 03/30/2020 2104   TCO2 30 03/30/2020 2104   O2SAT 87.0 03/30/2020 2104    Vent Settings: N/A FiO2 (%):  [100 %] 100 %  Condition - Extremely Guarded  Family Communication  : Granddaughter (Rodricka [phone]) over the phone on 9/2  Code Status :  DNR  Diet :  Diet Order            DIET - DYS 1 Room service appropriate? No; Fluid consistency: Nectar Thick  Diet effective now                  Disposition Plan  :   Status is: Inpatient  Remains inpatient appropriate because:Inpatient level of care appropriate due to severity of illness   Dispo: The patient is from: Home              Anticipated d/c is to: Home              Anticipated d/c date is: > 3 days              Patient currently is not medically stable to d/c.    Barriers to discharge: Hypoxia requiring O2 supplementation  Antimicorbials  :    Anti-infectives (From admission, onward)   Start     Dose/Rate Route Frequency Ordered Stop   04/03/20 1000  Ampicillin-Sulbactam (UNASYN) 3 g in sodium chloride 0.9 % 100 mL IVPB        3 g 200 mL/hr over 30 Minutes Intravenous Every 6 hours 04/03/20 0935 04/09/20 1900   03/31/20 1000  remdesivir 100 mg in sodium chloride 0.9 % 100 mL IVPB       "Followed by" Linked Group Details   100 mg 200 mL/hr over 30 Minutes Intravenous Daily 03/30/20 1445 04/03/20 0917   03/30/20 1445  remdesivir 200 mg in sodium chloride 0.9% 250 mL IVPB       "  Followed by" Linked Group Details   200 mg 580 mL/hr over 30 Minutes Intravenous Once 03/30/20 1445  03/30/20 1714      Inpatient Medications  Scheduled Meds: . acetaminophen  500 mg Oral TID  . albuterol  2 puff Inhalation TID  . vitamin C  500 mg Oral Daily  . atorvastatin  40 mg Oral Daily  . diclofenac Sodium  2 g Topical QID  . enoxaparin (LOVENOX) injection  30 mg Subcutaneous Q24H  . mouth rinse  15 mL Mouth Rinse BID  . metoprolol tartrate  12.5 mg Oral BID  . oxymetazoline  3 spray Each Nare BID  . pantoprazole  40 mg Oral Daily  . QUEtiapine  25 mg Oral QHS  . zinc sulfate  220 mg Oral Daily   Continuous Infusions: . sodium chloride Stopped (04/09/20 2034)   PRN Meds:.sodium chloride, acetaminophen, chlorpheniramine-HYDROcodone, docusate sodium, [DISCONTINUED] ondansetron **OR** ondansetron (ZOFRAN) IV, polyethylene glycol, Resource ThickenUp Clear, sodium chloride   Time Spent in minutes  25  See all Orders from today for further details   Jeoffrey Massed M.D on 04/17/2020 at 12:02 PM  To page go to [email or web address] - use universal password  [facility] -  Office  [phone]    Objective:   Vitals:   04/17/20 0025 04/17/20 0425 04/17/20 0738 04/17/20 1155  BP: 107/73 113/80 109/78 105/72  Pulse: 77  (!) 110 88  Resp: 18 14 20 20   Temp: 97.8 F (36.6 C) 97.7 F (36.5 C) 97.7 F (36.5 C) 97.6 F (36.4 C)  TempSrc: Axillary Axillary Axillary Axillary  SpO2: 97%  91% 100%  Weight:      Height:        Wt Readings from Last 3 Encounters:  04/15/20 53.6 kg  01/30/20 65.3 kg  12/05/19 66 kg     Intake/Output Summary (Last 24 hours) at 04/17/2020 1202 Last data filed at 04/17/2020 0800 Gross per 24 hour  Intake 240 ml  Output 200 ml  Net 40 ml     Physical Exam Gen Exam:Alert awake-not in any distress HEENT:atraumatic, normocephalic Chest: B/L clear to auscultation anteriorly CVS:S1S2 regular Abdomen:soft non tender, non distended Extremities:no edema Neurology: Non focal Skin: no rash   Data Review:    CBC Recent Labs  Lab  04/13/20 0246 04/14/20 0324 04/15/20 0152 04/16/20 0140 04/17/20 1050  WBC 15.0* 16.7* 13.0* 11.3* 10.4  HGB 13.7 14.3 13.3 13.2 13.1  HCT 42.4 45.1 41.9 42.3 41.8  PLT 315 323 305 283 243  MCV 93.2 92.6 92.5 93.8 95.2  MCH 30.1 29.4 29.4 29.3 29.8  MCHC 32.3 31.7 31.7 31.2 31.3  RDW 14.5 14.6 14.9 14.9 15.1    Chemistries  Recent Labs  Lab 04/11/20 0119 04/11/20 0119 04/12/20 0043 04/12/20 0043 04/13/20 0246 04/14/20 0324 04/15/20 0152 04/16/20 0140 04/17/20 1050  NA 138   < > 136   < > 134* 138 139 141 141  K 4.5   < > 4.9   < > 5.0 4.0 4.7 4.5 4.3  CL 103   < > 102   < > 100 101 104 106 106  CO2 28   < > 25   < > 24 27 26 24 24   GLUCOSE 173*   < > 182*   < > 266* 120* 154* 176* 146*  BUN 19   < > 17   < > 16 17 16 15 12   CREATININE 0.63   < > 0.58   < >  0.64 0.59 0.69 0.65 0.60  CALCIUM 8.5*   < > 8.5*   < > 8.4* 8.5* 8.5* 8.6* 8.4*  AST 16  --  18  --  17 17  --   --   --   ALT 21  --  24  --  25 23  --   --   --   ALKPHOS 50  --  46  --  48 50  --   --   --   BILITOT 0.8  --  0.3  --  0.6 0.8  --   --   --    < > = values in this interval not displayed.   ------------------------------------------------------------------------------------------------------------------ No results for input(s): CHOL, HDL, LDLCALC, TRIG, CHOLHDL, LDLDIRECT in the last 72 hours.  Lab Results  Component Value Date   HGBA1C 5.3 04/15/2017   ------------------------------------------------------------------------------------------------------------------ No results for input(s): TSH, T4TOTAL, T3FREE, THYROIDAB in the last 72 hours.  Invalid input(s): FREET3 ------------------------------------------------------------------------------------------------------------------ No results for input(s): VITAMINB12, FOLATE, FERRITIN, TIBC, IRON, RETICCTPCT in the last 72 hours.  Coagulation profile No results for input(s): INR, PROTIME in the last 168 hours.  Recent Labs     04/16/20 0140 04/17/20 1050  DDIMER 1.21* 2.29*    Cardiac Enzymes No results for input(s): CKMB, TROPONINI, MYOGLOBIN in the last 168 hours.  Invalid input(s): CK ------------------------------------------------------------------------------------------------------------------    Component Value Date/Time   BNP 46.1 04/09/2020 0701    Micro Results No results found for this or any previous visit (from the past 240 hour(s)).  Radiology Reports DG Chest Port 1 View  Result Date: 04/07/2020 CLINICAL DATA:  Shortness of breath.  Reported COVID-19 positive. EXAM: PORTABLE CHEST 1 VIEW COMPARISON:  April 03, 2020 FINDINGS: There is persistent airspace consolidation in the left base with small left pleural effusion. There is slightly less airspace consolidation in the right base compared to recent study. There is ill-defined opacity in the right mid lung, stable. More subtle ill-defined opacity is noted in the left mid lung. Heart size and pulmonary vascular normal. No adenopathy. There is aortic atherosclerosis. No bone lesions. IMPRESSION: Areas of multifocal pneumonia with consolidation greatest in the left base region. Small left pleural effusion. Suspect atypical organism pneumonia as most likely etiology. Superimposed bacterial pneumonia in the left base is possible. Stable cardiac silhouette. Aortic Atherosclerosis (ICD10-I70.0). Electronically Signed   By: Bretta Bang III M.D.   On: 04/07/2020 08:08   DG Chest Port 1 View  Result Date: 04/03/2020 CLINICAL DATA:  History of COVID infection EXAM: PORTABLE CHEST 1 VIEW COMPARISON:  April 02, 2020 FINDINGS: Images rotated to the LEFT. Accounting for this rotation cardiomediastinal contours are stable. Increasing opacity at the LEFT lung base compared to the prior study. Also with increased opacity at the RIGHT lung base. Air bronchograms are seen at the LEFT lung base on today's study as well. Exam is more similar to the study of  August 15. Probable small LEFT effusion as before. On limited assessment skeletal structures are unremarkable. IMPRESSION: Bilateral opacities worse at the lung bases and worse in the LEFT lung base, similar to prior imaging in this patient with reported history of COVID-19 pneumonia. Electronically Signed   By: Donzetta Kohut M.D.   On: 04/03/2020 07:54   DG Chest Port 1 View  Result Date: 04/02/2020 CLINICAL DATA:  Acute respiratory failure with hypoxemia, COVID-19 EXAM: PORTABLE CHEST 1 VIEW COMPARISON:  Portable exam 0652 hours compared 03/30/2020 FINDINGS: Normal heart size, mediastinal contours, and  pulmonary vascularity. Atherosclerotic calcification aorta. Scattered pulmonary infiltrates bilaterally consistent with multifocal pneumonia most confluent in the LEFT lower lobe. No pleural effusion or pneumothorax. Mild osseous demineralization. IMPRESSION: Persistent pulmonary infiltrates consistent with multifocal pneumonia and history of COVID-19, slightly improved. Electronically Signed   By: Ulyses Southward M.D.   On: 04/02/2020 08:34   DG Chest Port 1 View  Result Date: 03/30/2020 CLINICAL DATA:  Patient with history of COVID-19. Worsening shortness of breath. EXAM: PORTABLE CHEST 1 VIEW COMPARISON:  None. FINDINGS: Monitoring leads overlie the patient. Enlarged cardiac and mediastinal contours. Diffuse bilateral airspace opacities. Probable small left pleural effusion. No pneumothorax. IMPRESSION: 1. Cardiomegaly. 2. Diffuse bilateral airspace opacities most compatible with multifocal pneumonia. Electronically Signed   By: Annia Belt M.D.   On: 03/30/2020 12:51   VAS [place] LOWER EXTREMITY VENOUS (DVT)  Result Date: 04/02/2020  Lower Venous DVTStudy Other Indications: Covid positive with elevated d-dimer. Performing Technologist: Marilynne Halsted RDMS, RVT  Examination Guidelines: A complete evaluation includes B-mode imaging, spectral Doppler, color Doppler, and power Doppler as needed of all  accessible portions of each vessel. Bilateral testing is considered an integral part of a complete examination. Limited examinations for reoccurring indications may be performed as noted. The reflux portion of the exam is performed with the patient in reverse Trendelenburg.  +---------+---------------+---------+-----------+----------+--------------+ RIGHT    CompressibilityPhasicitySpontaneityPropertiesThrombus Aging +---------+---------------+---------+-----------+----------+--------------+ CFV      Full           Yes      Yes                                 +---------+---------------+---------+-----------+----------+--------------+ SFJ      Full                                                        +---------+---------------+---------+-----------+----------+--------------+ FV Prox  Full                                                        +---------+---------------+---------+-----------+----------+--------------+ FV Mid   Full                                                        +---------+---------------+---------+-----------+----------+--------------+ FV DistalFull                                                        +---------+---------------+---------+-----------+----------+--------------+ PFV      Full                                                        +---------+---------------+---------+-----------+----------+--------------+ POP  Full           Yes      Yes                                 +---------+---------------+---------+-----------+----------+--------------+ PTV      Full                                                        +---------+---------------+---------+-----------+----------+--------------+ PERO     Full                                                        +---------+---------------+---------+-----------+----------+--------------+   +---------+---------------+---------+-----------+----------+--------------+  LEFT     CompressibilityPhasicitySpontaneityPropertiesThrombus Aging +---------+---------------+---------+-----------+----------+--------------+ CFV      Full           Yes      Yes                                 +---------+---------------+---------+-----------+----------+--------------+ SFJ      Full                                                        +---------+---------------+---------+-----------+----------+--------------+ FV Prox  Full                                                        +---------+---------------+---------+-----------+----------+--------------+ FV Mid   Full                                                        +---------+---------------+---------+-----------+----------+--------------+ FV DistalFull                                                        +---------+---------------+---------+-----------+----------+--------------+ PFV      Full                                                        +---------+---------------+---------+-----------+----------+--------------+ POP      Full           Yes      Yes                                 +---------+---------------+---------+-----------+----------+--------------+  PTV      Full                                                        +---------+---------------+---------+-----------+----------+--------------+ PERO     Full                                                        +---------+---------------+---------+-----------+----------+--------------+     Summary: BILATERAL: - No evidence of deep vein thrombosis seen in the lower extremities, bilaterally. -No evidence of popliteal cyst, bilaterally.   *See table(s) above for measurements and observations. Electronically signed by Gretta Began MD on 04/02/2020 at 5:04:02 PM.    Final    ECHOCARDIOGRAM LIMITED  Result Date: 03/31/2020    ECHOCARDIOGRAM LIMITED REPORT   Patient Name:   TAMANIKA HEINEY Date of Exam: 03/31/2020  Medical Rec #:  [id number]     Height:       62.0 in Accession #:    [id number]    Weight:       142.2 lb Date of Birth:  [birth year]     BSA:          1.654 m Patient Age:    68 years      BP:           136/80 mmHg Patient Gender: F             HR:           86 bpm. Exam Location:  Inpatient Procedure: Limited Echo, Color Doppler and Cardiac Doppler Indications:    R06.9 DOE  History:        Patient has no prior history of Echocardiogram examinations.                 Risk Factors:Dyslipidemia.  Sonographer:    Irving Burton Senior RDCS Referring Phys: Humberto Seals  Sonographer Comments: COVID+ at time of study IMPRESSIONS  1. Abnormal septal motion with distal hypokinesis . Left ventricular ejection fraction, by estimation, is 50 to 55%. The left ventricle has low normal function. Left ventricular endocardial border not optimally defined to evaluate regional wall motion. Left ventricular diastolic parameters are indeterminate.  2. The mitral valve is normal in structure. Mild mitral valve regurgitation.  3. The aortic valve is tricuspid. Aortic valve regurgitation is not visualized. Mild aortic valve sclerosis is present, with no evidence of aortic valve stenosis.  4. There is normal pulmonary artery systolic pressure.  5. The inferior vena cava is normal in size with greater than 50% respiratory variability, suggesting right atrial pressure of 3 mmHg. FINDINGS  Left Ventricle: Abnormal septal motion with distal hypokinesis. Left ventricular ejection fraction, by estimation, is 50 to 55%. The left ventricle has low normal function. Left ventricular endocardial border not optimally defined to evaluate regional wall motion. The left ventricular internal cavity size was normal in size. Right Ventricle: There is normal pulmonary artery systolic pressure. The tricuspid regurgitant velocity is 2.33 m/s, and with an assumed right atrial pressure of 3 mmHg, the estimated right ventricular systolic pressure is 24.7 mmHg.  Mitral Valve: The mitral valve is normal in structure. There is mild  thickening of the mitral valve leaflet(s). There is mild calcification of the mitral valve leaflet(s). Mild mitral annular calcification. Mild mitral valve regurgitation. Tricuspid Valve: The tricuspid valve is normal in structure. Tricuspid valve regurgitation is mild. Aortic Valve: The aortic valve is tricuspid. Aortic valve regurgitation is not visualized. Mild aortic valve sclerosis is present, with no evidence of aortic valve stenosis. Venous: The inferior vena cava is normal in size with greater than 50% respiratory variability, suggesting right atrial pressure of 3 mmHg. IAS/Shunts: The interatrial septum was not well visualized. RIGHT VENTRICLE RV S prime:     10.80 cm/s TAPSE (M-mode): 1.7 cm AORTIC VALVE LVOT Vmax:   95.30 cm/s LVOT Vmean:  65.900 cm/s LVOT VTI:    0.180 m MITRAL VALVE               TRICUSPID VALVE MV Area (PHT): 4.21 cm    TR Peak grad:   21.7 mmHg MV Decel Time: 180 msec    TR Vmax:        233.00 cm/s MV E velocity: 57.70 cm/s MV A velocity: 73.20 cm/s  SHUNTS MV E/A ratio:  0.79        Systemic VTI: 0.18 m Charlton Haws MD Electronically signed by Charlton Haws MD Signature Date/Time: 03/31/2020/3:13:28 PM    Final

## 2020-04-17 NOTE — Progress Notes (Signed)
CSW received consult from MD to see if [facility] could accept patient. CSW sent referral. [facility] reported they are unable to accept unless patient is on on 65% FiO2 or less.   Jermell Holeman LCSW

## 2020-04-17 NOTE — Progress Notes (Signed)
PT Cancellation Note  Patient Details Name: Christina Russell MRN: [id number] DOB: October 31, 1950   Cancelled Treatment:    Reason Eval/Treat Not Completed: Medical issues which prohibited therapy. RN requesting hold on PT treatment today; pt becoming tachycardic with minimal repositioning at bed-level. Will check back for appropriateness as schedule permits.  Ina Homes, PT, DPT Acute Rehabilitation Services  Pager [phone] Office [phone]  Malachy Chamber 04/17/2020, 10:31 AM

## 2020-04-18 ENCOUNTER — Inpatient Hospital Stay (HOSPITAL_COMMUNITY): Payer: Medicare Other

## 2020-04-18 LAB — CBC
HCT: 39.8 % (ref 36.0–46.0)
Hemoglobin: 12.5 g/dL (ref 12.0–15.0)
MCH: 29.7 pg (ref 26.0–34.0)
MCHC: 31.4 g/dL (ref 30.0–36.0)
MCV: 94.5 fL (ref 80.0–100.0)
Platelets: 235 10*3/uL (ref 150–400)
RBC: 4.21 MIL/uL (ref 3.87–5.11)
RDW: 15 % (ref 11.5–15.5)
WBC: 8.2 10*3/uL (ref 4.0–10.5)
nRBC: 0 % (ref 0.0–0.2)

## 2020-04-18 LAB — BASIC METABOLIC PANEL
Anion gap: 9 (ref 5–15)
BUN: 11 mg/dL (ref 8–23)
CO2: 27 mmol/L (ref 22–32)
Calcium: 8.4 mg/dL — ABNORMAL LOW (ref 8.9–10.3)
Chloride: 104 mmol/L (ref 98–111)
Creatinine, Ser: 0.62 mg/dL (ref 0.44–1.00)
GFR calc Af Amer: >60 mL/min (ref >60)
GFR calc non Af Amer: >60 mL/min (ref >60)
Glucose, Bld: 95 mg/dL (ref 70–99)
Potassium: 3.8 mmol/L (ref 3.5–5.1)
Sodium: 140 mmol/L (ref 135–145)

## 2020-04-18 LAB — GLUCOSE, CAPILLARY
Glucose-Capillary: 106 mg/dL — ABNORMAL HIGH (ref 70–99)
Glucose-Capillary: 113 mg/dL — ABNORMAL HIGH (ref 70–99)
Glucose-Capillary: 133 mg/dL — ABNORMAL HIGH (ref 70–99)
Glucose-Capillary: 148 mg/dL — ABNORMAL HIGH (ref 70–99)

## 2020-04-18 LAB — D-DIMER, QUANTITATIVE: D-Dimer, Quant: 1.79 ug/mL-FEU — ABNORMAL HIGH (ref 0.00–0.50)

## 2020-04-18 MED ORDER — MORPHINE SULFATE (PF) 2 MG/ML IV SOLN
2.0000 mg | Freq: Once | INTRAVENOUS | Status: AC
  Administered 2020-04-18: 2 mg via INTRAVENOUS
  Filled 2020-04-18: qty 1

## 2020-04-18 NOTE — Progress Notes (Signed)
Physical Therapy Treatment Patient Details Name: Christina Russell MRN: [id number] DOB: December 04, 1950 Today's Date: 04/18/2020    History of Present Illness Pt is a 69 year old female who is not vaccinated for Covid and has underlying history of dementia, dyslipidemia who presented to the hospital 03/30/20 with shortness of breath.  She was diagnosed with acute hypoxic respiratory failure due to COVID-19 pneumonia.   PT Comments    Pt slowly progressing with mobility. Able to perform brief bouts of standing and seated activity with minA, quick to become fatigued, SOB and anxious with movement. SpO2 83-87% on 15L O2 HFNC with good pleth, HR up to 133. Pt responded well to music for relaxation/distraction. Continue to recommend post-acute rehab.    Follow Up Recommendations  SNF;LTACH     Equipment Recommendations   (defer)    Recommendations for Other Services       Precautions / Restrictions Precautions Precautions: Fall;Other (comment) Precaution Comments: High anxiety with mobility, watch HR/SpO2 Restrictions Weight Bearing Restrictions: No    Mobility  Bed Mobility Overal bed mobility: Needs Assistance Bed Mobility: Supine to Sit     Supine to sit: Min assist        Transfers Overall transfer level: Needs assistance Equipment used: Rolling walker (2 wheeled);1 person hand held assist Transfers: Sit to/from Stand Sit to Stand: Min assist;+2 safety/equipment         General transfer comment: Initial stand to RW with minA for trunk elevation and balance, pt moving well but quick to become anxious and return to sit; additional stand with minA for HHA to pivot to recliner, pt very anxious and fatigued with increased WOB "I'm tired, I'm not doing anything else"  Ambulation/Gait                 Stairs             Wheelchair Mobility    Modified Rankin (Stroke Patients Only)       Balance Overall balance assessment: Needs assistance Sitting-balance  support: Feet unsupported;Bilateral upper extremity supported;No upper extremity supported Sitting balance-Leahy Scale: Fair       Standing balance-Leahy Scale: Poor                              Cognition Arousal/Alertness: Awake/alert Behavior During Therapy: Anxious Overall Cognitive Status: History of cognitive impairments - at baseline                                 General Comments: Dementia at baseline. Pt very anxious, requiring extensive cues for comfort/relaxation. Responded well to music      Exercises General Exercises - Lower Extremity Ankle Circles/Pumps: AROM;Both;Seated Long Arc Quad: AROM;Both;Seated Straight Leg Raises: AROM;Right;Seated    General Comments General comments (skin integrity, edema, etc.): SpO2 83-87% on 15L O2 HFNC with good pleth (down to 70s% with poor pleth when pt anxious and very SOB), HR up to 133. Enjoyed listening to "[place]" by [employer]      Pertinent Vitals/Pain Pain Assessment: Faces Faces Pain Scale: Hurts little more Pain Location: "Everywhere" especially headache Pain Descriptors / Indicators: Discomfort;Tiring Pain Intervention(s): Monitored during session;Limited activity within patient's tolerance    Home Living                      Prior Function  PT Goals (current goals can now be found in the care plan section) Progress towards PT goals: Progressing toward goals (slowly)    Frequency    Min 2X/week      PT Plan Current plan remains appropriate    Co-evaluation PT/OT/SLP Co-Evaluation/Treatment: Yes Reason for Co-Treatment: Complexity of the patient's impairments (multi-system involvement);Necessary to address cognition/behavior during functional activity;For patient/therapist safety;To address functional/ADL transfers PT goals addressed during session: Mobility/safety with mobility;Balance        AM-PAC PT "6 Clicks" Mobility   Outcome  Measure  Help needed turning from your back to your side while in a flat bed without using bedrails?: A Little Help needed moving from lying on your back to sitting on the side of a flat bed without using bedrails?: A Little   Help needed standing up from a chair using your arms (e.g., wheelchair or bedside chair)?: A Little Help needed to walk in hospital room?: A Lot Help needed climbing 3-5 steps with a railing? : Total 6 Click Score: 12    End of Session Equipment Utilized During Treatment: Oxygen Activity Tolerance: Patient tolerated treatment well;Treatment limited secondary to medical complications (Comment) Patient left: in chair;with call bell/phone within reach;with chair alarm set Nurse Communication: Mobility status PT Visit Diagnosis: Other abnormalities of gait and mobility (R26.89);Muscle weakness (generalized) (M62.81)     Time: 7939-0300 PT Time Calculation (min) (ACUTE ONLY): 24 min  Charges:  $Therapeutic Activity: 8-22 mins                    Ina Homes, PT, DPT Acute Rehabilitation Services  Pager [phone] Office [phone]  Malachy Chamber 04/18/2020, 2:25 PM

## 2020-04-18 NOTE — Progress Notes (Signed)
CSW contacted [facility] to see if they could accept patient now that she is on on HFNC. They reported they do not have beds until next week. CSW sent referral to [facility] for review; they will follow up with covering CSW, Marisue Ivan. CSW made patient's granddaughter, Rodricka, aware of the referral. She reported understanding and will await response from [facility].   Reyne Falconi LCSW

## 2020-04-18 NOTE — Progress Notes (Signed)
PROGRESS NOTE                                                                                                                                                                                                             Patient Demographics:    Christina Russell, is a 69 y.o. female, DOB - 11-06-1950, GQB:[id number]  Outpatient Primary MD for the patient is [name], MD   Admit date - 03/30/2020   LOS - 19  Chief Complaint  Patient presents with  . Covid/ Distress  . COVID positive       Brief Narrative: Patient is a 69 y.o. female with PMHx of HTN, HLD, dementia-tested positive for COVID-19 on 8/12-presented to the ED on 8/15 with worsening shortness of breath-she was diagnosed with Covid 19 pneumonia and acute hypoxic respiratory failure-she was initially admitted to the ICU-and required heated high flow.  She was lysed-and transferred to [facility] service on 8/18.  Hospital course complicated by persistent severe hypoxemia, aspiration pneumonia and features compatible with failure to thrive syndrome-see below for further details  COVID-19 vaccinated status: Unvaccinated  Significant Events: 8/15>> Admit for hypoxemia secondary to COVID-19 pneumonia 8/18>> transfer to [facility] from ICU  Significant studies: 8/15>>Chest x-ray: Diffuse bilateral airspace opacities-compatible with multifocal pneumonia 8/16>> Echo: EF 50-55% 8/18>> bilateral lower extremity Doppler: No DVT. 8/18>> chest x-ray: Persistent pulmonary infiltrates consistent with multifocal pneumonia 8/23>> chest x-ray: Areas of multifocal pneumonia with consolidation greatest in the left base region, small left pleural effusion  COVID-19 medications: Steroids: 8/15>> 8/31 Remdesivir: 8/15>> 8/19 Baricitinib: 8/16>> 8/28  Antibiotics: Unasyn: 8/19>> 8/25  Microbiology data: 8/15 >>blood culture: No growth  Procedures: None  Consults: None  DVT  prophylaxis: enoxaparin (LOVENOX) injection 30 mg Start: 04/17/20 0800 SCDs Start: 03/30/20 2248     Subjective:   Appears comfortable-following commands-not confused.  Comfortable on NRB-RN subsequently switched him to 15 L of HFNC.    Assessment  & Plan :   Acute Hypoxic Resp Failure due to Covid 19 Viral pneumonia: Continues to have severe hypoxemia-probably is in the fibrotic stage of ARDS.Although severely hypoxemic-she appears comfortable-and has remained stable for the past several days.  Has finished a course of steroids/baricitinib hand Remdesivir.  No evidence of volume overload.  Continue to attempt to titrate down FiO2.   Fever: afebrile O2 requirements:  SpO2: Marland Kitchen)  88 % O2 Flow Rate (L/min): 15 L/min FiO2 (%): 100 %   COVID-19 Labs: Recent Labs    04/16/20 0140 04/17/20 1050 04/18/20 0252  DDIMER 1.21* 2.29* 1.79*       Component Value Date/Time   BNP 46.1 04/09/2020 0701    No results for input(s): PROCALCITON in the last 168 hours.  Lab Results  Component Value Date   SARSCOV2NAA POSITIVE (A) 03/30/2020     Prone/Incentive Spirometry: encouraged  incentive spirometry use 3-4/hour.  Aspiration pneumonia: Finished a course of Unasyn-SLP following-on dysphagia 1 diet.  Elevated D-dimer: CTA chest/Doppler negative for VTE-D-dimer trending down-on prophylactic Lovenox.  Acute on chronic diastolic heart failure: Seems reasonably compensated-diuretics on hold  Dementia with delirium: Delirium continues to wax and wane-but overall improved.  Continue to avoid narcotics/benzodiazepines as much as possible-has been tolerating low-dose Seroquel well.  HLD: Continue statin   Palliative care discussion: Her prognosis is guarded-this MD and other prior MDs have spoken with family-palliative care has been following.  Plan is to continue to support her as much as possible-and if she were to deteriorate significantly-they are aware that apart from transitioning to  hospice care we really do not have any other options.  GI prophylaxis: PPI  ABG:    Component Value Date/Time   PHART 7.468 (H) 03/30/2020 2104   PCO2ART 39.2 03/30/2020 2104   PO2ART 49 (L) 03/30/2020 2104   HCO3 28.4 (H) 03/30/2020 2104   TCO2 30 03/30/2020 2104   O2SAT 87.0 03/30/2020 2104    Vent Settings: N/A    Condition - Extremely Guarded  Family Communication  : Granddaughter (Rodricka [phone]) over the phone on 9/3  Code Status :  DNR  Diet :  Diet Order            DIET - DYS 1 Room service appropriate? No; Fluid consistency: Nectar Thick  Diet effective now                  Disposition Plan  :   Status is: Inpatient  Remains inpatient appropriate because:Inpatient level of care appropriate due to severity of illness   Dispo: The patient is from: Home              Anticipated d/c is to: [facility]              Anticipated d/c date is: > 3 days              Patient currently is not medically stable to d/c.    Barriers to discharge: Hypoxia requiring O2 supplementation  Antimicorbials  :    Anti-infectives (From admission, onward)   Start     Dose/Rate Route Frequency Ordered Stop   04/03/20 1000  Ampicillin-Sulbactam (UNASYN) 3 g in sodium chloride 0.9 % 100 mL IVPB        3 g 200 mL/hr over 30 Minutes Intravenous Every 6 hours 04/03/20 0935 04/09/20 1900   03/31/20 1000  remdesivir 100 mg in sodium chloride 0.9 % 100 mL IVPB       "Followed by" Linked Group Details   100 mg 200 mL/hr over 30 Minutes Intravenous Daily 03/30/20 1445 04/03/20 0917   03/30/20 1445  remdesivir 200 mg in sodium chloride 0.9% 250 mL IVPB       "Followed by" Linked Group Details   200 mg 580 mL/hr over 30 Minutes Intravenous Once 03/30/20 1445 03/30/20 1714      Inpatient Medications  Scheduled Meds: .  acetaminophen  500 mg Oral TID  . albuterol  2 puff Inhalation TID  . vitamin C  500 mg Oral Daily  . atorvastatin  40 mg Oral Daily  . diclofenac Sodium  2 g  Topical QID  . enoxaparin (LOVENOX) injection  30 mg Subcutaneous Q24H  . mouth rinse  15 mL Mouth Rinse BID  . metoprolol tartrate  12.5 mg Oral BID  . pantoprazole  40 mg Oral Daily  . QUEtiapine  25 mg Oral QHS  . zinc sulfate  220 mg Oral Daily   Continuous Infusions: . sodium chloride Stopped (04/09/20 2034)   PRN Meds:.sodium chloride, acetaminophen, chlorpheniramine-HYDROcodone, docusate sodium, [DISCONTINUED] ondansetron **OR** ondansetron (ZOFRAN) IV, polyethylene glycol, Resource ThickenUp Clear, sodium chloride   Time Spent in minutes  25  See all Orders from today for further details   Jeoffrey Massed M.D on 04/18/2020 at 2:37 PM  To page go to [email or web address] - use universal password  [facility] -  Office  [phone]    Objective:   Vitals:   04/18/20 0400 04/18/20 0500 04/18/20 0754 04/18/20 1152  BP: 118/61  115/80 110/65  Pulse: 89  99 100  Resp: 20  18 (!) 25  Temp: 98 F (36.7 C)  98.4 F (36.9 C) 98.3 F (36.8 C)  TempSrc: Axillary  Oral Oral  SpO2: 93%  92% (!) 88%  Weight:  60 kg    Height:        Wt Readings from Last 3 Encounters:  04/18/20 60 kg  01/30/20 65.3 kg  12/05/19 66 kg     Intake/Output Summary (Last 24 hours) at 04/18/2020 1437 Last data filed at 04/18/2020 1322 Gross per 24 hour  Intake 360 ml  Output 700 ml  Net -340 ml     Physical Exam Gen Exam:Alert awake-not in any distress HEENT:atraumatic, normocephalic Chest: B/L clear to auscultation anteriorly CVS:S1S2 regular Abdomen:soft non tender, non distended Extremities:no edema Neurology: Non focal Skin: no rash  Data Review:    CBC Recent Labs  Lab 04/14/20 0324 04/15/20 0152 04/16/20 0140 04/17/20 1050 04/18/20 0252  WBC 16.7* 13.0* 11.3* 10.4 8.2  HGB 14.3 13.3 13.2 13.1 12.5  HCT 45.1 41.9 42.3 41.8 39.8  PLT 323 305 283 243 235  MCV 92.6 92.5 93.8 95.2 94.5  MCH 29.4 29.4 29.3 29.8 29.7  MCHC 31.7 31.7 31.2 31.3 31.4  RDW 14.6 14.9 14.9  15.1 15.0    Chemistries  Recent Labs  Lab 04/12/20 0043 04/12/20 0043 04/13/20 0246 04/13/20 0246 04/14/20 0324 04/15/20 0152 04/16/20 0140 04/17/20 1050 04/18/20 0252  NA 136   < > 134*   < > 138 139 141 141 140  K 4.9   < > 5.0   < > 4.0 4.7 4.5 4.3 3.8  CL 102   < > 100   < > 101 104 106 106 104  CO2 25   < > 24   < > 27 26 24 24 27   GLUCOSE 182*   < > 266*   < > 120* 154* 176* 146* 95  BUN 17   < > 16   < > 17 16 15 12 11   CREATININE 0.58   < > 0.64   < > 0.59 0.69 0.65 0.60 0.62  CALCIUM 8.5*   < > 8.4*   < > 8.5* 8.5* 8.6* 8.4* 8.4*  AST 18  --  17  --  17  --   --   --   --  ALT 24  --  25  --  23  --   --   --   --   ALKPHOS 46  --  48  --  50  --   --   --   --   BILITOT 0.3  --  0.6  --  0.8  --   --   --   --    < > = values in this interval not displayed.   ------------------------------------------------------------------------------------------------------------------ No results for input(s): CHOL, HDL, LDLCALC, TRIG, CHOLHDL, LDLDIRECT in the last 72 hours.  Lab Results  Component Value Date   HGBA1C 5.3 04/15/2017   ------------------------------------------------------------------------------------------------------------------ No results for input(s): TSH, T4TOTAL, T3FREE, THYROIDAB in the last 72 hours.  Invalid input(s): FREET3 ------------------------------------------------------------------------------------------------------------------ No results for input(s): VITAMINB12, FOLATE, FERRITIN, TIBC, IRON, RETICCTPCT in the last 72 hours.  Coagulation profile No results for input(s): INR, PROTIME in the last 168 hours.  Recent Labs    04/17/20 1050 04/18/20 0252  DDIMER 2.29* 1.79*    Cardiac Enzymes No results for input(s): CKMB, TROPONINI, MYOGLOBIN in the last 168 hours.  Invalid input(s): CK ------------------------------------------------------------------------------------------------------------------    Component Value Date/Time    BNP 46.1 04/09/2020 0701    Micro Results No results found for this or any previous visit (from the past 240 hour(s)).  Radiology Reports DG Chest Port 1 View  Result Date: 04/07/2020 CLINICAL DATA:  Shortness of breath.  Reported COVID-19 positive. EXAM: PORTABLE CHEST 1 VIEW COMPARISON:  April 03, 2020 FINDINGS: There is persistent airspace consolidation in the left base with small left pleural effusion. There is slightly less airspace consolidation in the right base compared to recent study. There is ill-defined opacity in the right mid lung, stable. More subtle ill-defined opacity is noted in the left mid lung. Heart size and pulmonary vascular normal. No adenopathy. There is aortic atherosclerosis. No bone lesions. IMPRESSION: Areas of multifocal pneumonia with consolidation greatest in the left base region. Small left pleural effusion. Suspect atypical organism pneumonia as most likely etiology. Superimposed bacterial pneumonia in the left base is possible. Stable cardiac silhouette. Aortic Atherosclerosis (ICD10-I70.0). Electronically Signed   By: Bretta Bang III M.D.   On: 04/07/2020 08:08   DG Chest Port 1 View  Result Date: 04/03/2020 CLINICAL DATA:  History of COVID infection EXAM: PORTABLE CHEST 1 VIEW COMPARISON:  April 02, 2020 FINDINGS: Images rotated to the LEFT. Accounting for this rotation cardiomediastinal contours are stable. Increasing opacity at the LEFT lung base compared to the prior study. Also with increased opacity at the RIGHT lung base. Air bronchograms are seen at the LEFT lung base on today's study as well. Exam is more similar to the study of August 15. Probable small LEFT effusion as before. On limited assessment skeletal structures are unremarkable. IMPRESSION: Bilateral opacities worse at the lung bases and worse in the LEFT lung base, similar to prior imaging in this patient with reported history of COVID-19 pneumonia. Electronically Signed   By: Donzetta Kohut M.D.   On: 04/03/2020 07:54   DG Chest Port 1 View  Result Date: 04/02/2020 CLINICAL DATA:  Acute respiratory failure with hypoxemia, COVID-19 EXAM: PORTABLE CHEST 1 VIEW COMPARISON:  Portable exam 0652 hours compared 03/30/2020 FINDINGS: Normal heart size, mediastinal contours, and pulmonary vascularity. Atherosclerotic calcification aorta. Scattered pulmonary infiltrates bilaterally consistent with multifocal pneumonia most confluent in the LEFT lower lobe. No pleural effusion or pneumothorax. Mild osseous demineralization. IMPRESSION: Persistent pulmonary infiltrates consistent with multifocal pneumonia and history  of COVID-19, slightly improved. Electronically Signed   By: Ulyses Southward M.D.   On: 04/02/2020 08:34   DG Chest Port 1 View  Result Date: 03/30/2020 CLINICAL DATA:  Patient with history of COVID-19. Worsening shortness of breath. EXAM: PORTABLE CHEST 1 VIEW COMPARISON:  None. FINDINGS: Monitoring leads overlie the patient. Enlarged cardiac and mediastinal contours. Diffuse bilateral airspace opacities. Probable small left pleural effusion. No pneumothorax. IMPRESSION: 1. Cardiomegaly. 2. Diffuse bilateral airspace opacities most compatible with multifocal pneumonia. Electronically Signed   By: Annia Belt M.D.   On: 03/30/2020 12:51   DG Chest Port 1V same Day  Result Date: 04/18/2020 CLINICAL DATA:  Shortness of breath. EXAM: PORTABLE CHEST 1 VIEW COMPARISON:  April 07, 2020. FINDINGS: Stable cardiomediastinal silhouette. No pneumothorax is noted. Increased bilateral airspace opacities are noted concerning for worsening pneumonia, with possible left pleural effusion. Bony thorax is unremarkable. IMPRESSION: Increased bilateral airspace opacities are noted concerning for worsening pneumonia, with possible left pleural effusion. Aortic Atherosclerosis (ICD10-I70.0). Electronically Signed   By: Lupita Raider M.D.   On: 04/18/2020 08:26   VAS [place] LOWER EXTREMITY VENOUS (DVT)  Result  Date: 04/02/2020  Lower Venous DVTStudy Other Indications: Covid positive with elevated d-dimer. Performing Technologist: Marilynne Halsted RDMS, RVT  Examination Guidelines: A complete evaluation includes B-mode imaging, spectral Doppler, color Doppler, and power Doppler as needed of all accessible portions of each vessel. Bilateral testing is considered an integral part of a complete examination. Limited examinations for reoccurring indications may be performed as noted. The reflux portion of the exam is performed with the patient in reverse Trendelenburg.  +---------+---------------+---------+-----------+----------+--------------+ RIGHT    CompressibilityPhasicitySpontaneityPropertiesThrombus Aging +---------+---------------+---------+-----------+----------+--------------+ CFV      Full           Yes      Yes                                 +---------+---------------+---------+-----------+----------+--------------+ SFJ      Full                                                        +---------+---------------+---------+-----------+----------+--------------+ FV Prox  Full                                                        +---------+---------------+---------+-----------+----------+--------------+ FV Mid   Full                                                        +---------+---------------+---------+-----------+----------+--------------+ FV DistalFull                                                        +---------+---------------+---------+-----------+----------+--------------+ PFV      Full                                                        +---------+---------------+---------+-----------+----------+--------------+  POP      Full           Yes      Yes                                 +---------+---------------+---------+-----------+----------+--------------+ PTV      Full                                                         +---------+---------------+---------+-----------+----------+--------------+ PERO     Full                                                        +---------+---------------+---------+-----------+----------+--------------+   +---------+---------------+---------+-----------+----------+--------------+ LEFT     CompressibilityPhasicitySpontaneityPropertiesThrombus Aging +---------+---------------+---------+-----------+----------+--------------+ CFV      Full           Yes      Yes                                 +---------+---------------+---------+-----------+----------+--------------+ SFJ      Full                                                        +---------+---------------+---------+-----------+----------+--------------+ FV Prox  Full                                                        +---------+---------------+---------+-----------+----------+--------------+ FV Mid   Full                                                        +---------+---------------+---------+-----------+----------+--------------+ FV DistalFull                                                        +---------+---------------+---------+-----------+----------+--------------+ PFV      Full                                                        +---------+---------------+---------+-----------+----------+--------------+ POP      Full           Yes      Yes                                 +---------+---------------+---------+-----------+----------+--------------+  PTV      Full                                                        +---------+---------------+---------+-----------+----------+--------------+ PERO     Full                                                        +---------+---------------+---------+-----------+----------+--------------+     Summary: BILATERAL: - No evidence of deep vein thrombosis seen in the lower extremities, bilaterally. -No evidence of  popliteal cyst, bilaterally.   *See table(s) above for measurements and observations. Electronically signed by Gretta Began MD on 04/02/2020 at 5:04:02 PM.    Final    ECHOCARDIOGRAM LIMITED  Result Date: 03/31/2020    ECHOCARDIOGRAM LIMITED REPORT   Patient Name:   ALISSA PHARR Date of Exam: 03/31/2020 Medical Rec #:  [id number]     Height:       62.0 in Accession #:    [id number]    Weight:       142.2 lb Date of Birth:  Jul 08, 1951     BSA:          1.654 m Patient Age:    68 years      BP:           136/80 mmHg Patient Gender: F             HR:           86 bpm. Exam Location:  Inpatient Procedure: Limited Echo, Color Doppler and Cardiac Doppler Indications:    R06.9 DOE  History:        Patient has no prior history of Echocardiogram examinations.                 Risk Factors:Dyslipidemia.  Sonographer:    Irving Burton Senior RDCS Referring Phys: Humberto Seals  Sonographer Comments: COVID+ at time of study IMPRESSIONS  1. Abnormal septal motion with distal hypokinesis . Left ventricular ejection fraction, by estimation, is 50 to 55%. The left ventricle has low normal function. Left ventricular endocardial border not optimally defined to evaluate regional wall motion. Left ventricular diastolic parameters are indeterminate.  2. The mitral valve is normal in structure. Mild mitral valve regurgitation.  3. The aortic valve is tricuspid. Aortic valve regurgitation is not visualized. Mild aortic valve sclerosis is present, with no evidence of aortic valve stenosis.  4. There is normal pulmonary artery systolic pressure.  5. The inferior vena cava is normal in size with greater than 50% respiratory variability, suggesting right atrial pressure of 3 mmHg. FINDINGS  Left Ventricle: Abnormal septal motion with distal hypokinesis. Left ventricular ejection fraction, by estimation, is 50 to 55%. The left ventricle has low normal function. Left ventricular endocardial border not optimally defined to evaluate regional wall  motion. The left ventricular internal cavity size was normal in size. Right Ventricle: There is normal pulmonary artery systolic pressure. The tricuspid regurgitant velocity is 2.33 m/s, and with an assumed right atrial pressure of 3 mmHg, the estimated right ventricular systolic pressure is 24.7 mmHg. Mitral Valve: The mitral valve is normal in structure. There is mild  thickening of the mitral valve leaflet(s). There is mild calcification of the mitral valve leaflet(s). Mild mitral annular calcification. Mild mitral valve regurgitation. Tricuspid Valve: The tricuspid valve is normal in structure. Tricuspid valve regurgitation is mild. Aortic Valve: The aortic valve is tricuspid. Aortic valve regurgitation is not visualized. Mild aortic valve sclerosis is present, with no evidence of aortic valve stenosis. Venous: The inferior vena cava is normal in size with greater than 50% respiratory variability, suggesting right atrial pressure of 3 mmHg. IAS/Shunts: The interatrial septum was not well visualized. RIGHT VENTRICLE RV S prime:     10.80 cm/s TAPSE (M-mode): 1.7 cm AORTIC VALVE LVOT Vmax:   95.30 cm/s LVOT Vmean:  65.900 cm/s LVOT VTI:    0.180 m MITRAL VALVE               TRICUSPID VALVE MV Area (PHT): 4.21 cm    TR Peak grad:   21.7 mmHg MV Decel Time: 180 msec    TR Vmax:        233.00 cm/s MV E velocity: 57.70 cm/s MV A velocity: 73.20 cm/s  SHUNTS MV E/A ratio:  0.79        Systemic VTI: 0.18 m Charlton Haws MD Electronically signed by Charlton Haws MD Signature Date/Time: 03/31/2020/3:13:28 PM    Final

## 2020-04-18 NOTE — Progress Notes (Addendum)
Occupational Therapy Treatment Patient Details Name: Christina Russell MRN: [id number] DOB: January 27, 1951 Today's Date: 04/18/2020    History of present illness Pt is a 69 year old female who is not vaccinated for Covid and has underlying history of dementia, dyslipidemia who presented to the hospital 03/30/20 with shortness of breath.  She was diagnosed with acute hypoxic respiratory failure due to COVID-19 pneumonia.   OT comments  Pt progressing towards established OT goals. Pt continues to present with decreased orientation, balance, strength, and activity tolerance. During activity, pt fatigues quickly and becomes anxious stating "I can't. I am too weak. I just can't" Pt requiring Min A +2 safety and increased encouragement for stand pivot to recliner. Pt performing grooming tasks while seated with supervision and increased cues. Continue to recommend dc to SNF and will continue to follow acutely as admitted.   SpO2 83-87% on 15L O2 HFNC with good pleth (down to 70s% with poor pleth when pt anxious and very SOB) HR elevating up to 133   Follow Up Recommendations  SNF    Equipment Recommendations  Wheelchair (measurements OT);Wheelchair cushion (measurements OT);3 in 1 bedside commode (hoyer lift and lift pads)    Recommendations for Other Services      Precautions / Restrictions Precautions Precautions: Fall;Other (comment) Precaution Comments: High anxiety with mobility, watch HR/SpO2 Restrictions Weight Bearing Restrictions: No       Mobility Bed Mobility Overal bed mobility: Needs Assistance Bed Mobility: Supine to Sit     Supine to sit: Min assist     General bed mobility comments: Min A for elevating trunk  Transfers Overall transfer level: Needs assistance Equipment used: Rolling walker (2 wheeled);1 person hand held assist Transfers: Sit to/from Stand Sit to Stand: Min assist;+2 safety/equipment         General transfer comment: Initial stand to RW with minA for  trunk elevation and balance, pt moving well but quick to become anxious and return to sit; additional stand with minA for HHA to pivot to recliner, pt very anxious and fatigued with increased WOB "I'm tired, I'm not doing anything else"    Balance Overall balance assessment: Needs assistance Sitting-balance support: Feet unsupported;Bilateral upper extremity supported;No upper extremity supported Sitting balance-Leahy Scale: Fair Sitting balance - Comments: reliant on external support   Standing balance support: Bilateral upper extremity supported;During functional activity Standing balance-Leahy Scale: Poor                             ADL either performed or assessed with clinical judgement   ADL Overall ADL's : Needs assistance/impaired     Grooming: Wash/dry face;Sitting;Set up;Supervision/safety;Cueing for sequencing (Applied lotion) Grooming Details (indicate cue type and reason): Set up and cues for washing her face and applying lotion to her hands                 Toilet Transfer: Minimal assistance;+2 for safety/equipment;Stand-pivot (simulated to recliner) Toilet Transfer Details (indicate cue type and reason): Attempting sit<>stand with RW however, pt sitting back at EOB. Able to perform stand pivot to recliner with Min A and increased encouragement.         Functional mobility during ADLs: Minimal assistance;+2 for safety/equipment (stand pivot) General ADL Comments: Pt performing stand pivot to recliner with Min A. Performing grooming in recliner to destract from increased anxiety.      Vision       Perception     Praxis  Cognition Arousal/Alertness: Awake/alert Behavior During Therapy: Anxious Overall Cognitive Status: History of cognitive impairments - at baseline                                 General Comments: Dementia at baseline. Pt very anxious, requiring extensive cues for comfort/relaxation. Responded well to  music        Exercises Exercises: Other exercises;General Lower Extremity General Exercises - Lower Extremity Ankle Circles/Pumps: AROM;Both;Seated Long Arc Quad: AROM;Both;Seated Straight Leg Raises: AROM;Right;Seated Other Exercises Other Exercises: IS 5x. pulled 250 ml. Fatigues quickly and decreased understanding   Shoulder Instructions       General Comments SpO2 83-87% on 15L O2 HFNC with good pleth (down to 70s% with poor pleth when pt anxious and very SOB), HR up to 133. Enjoyed listening to "[place]" by [employer]    Pertinent Vitals/ Pain       Pain Assessment: Faces Faces Pain Scale: Hurts little more Pain Location: "Everywhere" especially headache Pain Descriptors / Indicators: Discomfort;Tiring Pain Intervention(s): Monitored during session;Limited activity within patient's tolerance;Repositioned  Home Living                                          Prior Functioning/Environment              Frequency  Min 2X/week        Progress Toward Goals  OT Goals(current goals can now be found in the care plan section)  Progress towards OT goals: Progressing toward goals  Acute Rehab OT Goals Patient Stated Goal: home OT Goal Formulation: Patient unable to participate in goal setting Time For Goal Achievement: 04/24/20 Potential to Achieve Goals: Good ADL Goals Pt Will Perform Grooming: with min assist;sitting Pt Will Perform Upper Body Bathing: with min assist;sitting Pt Will Transfer to Toilet: with mod assist;squat pivot transfer;bedside commode Pt/caregiver will Perform Home Exercise Program: Increased strength;Both right and left upper extremity;With written HEP provided Additional ADL Goal #1: Pt will sit EOB with min A for 7-10 mins with SpO2 sats >88%  Plan Discharge plan remains appropriate    Co-evaluation    PT/OT/SLP Co-Evaluation/Treatment: Yes Reason for Co-Treatment: Complexity of the patient's impairments  (multi-system involvement);For patient/therapist safety;To address functional/ADL transfers PT goals addressed during session: Mobility/safety with mobility;Balance OT goals addressed during session: ADL's and self-care      AM-PAC OT "6 Clicks" Daily Activity     Outcome Measure   Help from another person eating meals?: Total Help from another person taking care of personal grooming?: A Little Help from another person toileting, which includes using toliet, bedpan, or urinal?: Total Help from another person bathing (including washing, rinsing, drying)?: Total Help from another person to put on and taking off regular upper body clothing?: Total Help from another person to put on and taking off regular lower body clothing?: A Lot 6 Click Score: 9    End of Session Equipment Utilized During Treatment: Oxygen (HHFNC; NRB)  OT Visit Diagnosis: Unsteadiness on feet (R26.81);Other abnormalities of gait and mobility (R26.89);Muscle weakness (generalized) (M62.81);Other symptoms and signs involving cognitive function   Activity Tolerance Treatment limited secondary to medical complications (Comment) (desat)   Patient Left in bed;with call bell/phone within reach   Nurse Communication Mobility status        Time: 2800-3491 OT Time  Calculation (min): 24 min  Charges: OT General Charges $OT Visit: 1 Visit OT Treatments $Self Care/Home Management : 8-22 mins  Arletha Marschke MSOT, OTR/L Acute Rehab Pager: [phone] Office: [phone]   Theodoro Grist Welford Christmas 04/18/2020, 5:21 PM

## 2020-04-18 NOTE — Progress Notes (Signed)
Patient's VS was turned to yellow for pulse and respiration. Discussed patient's condition with CN Kathlene November and notified on call MD A. Elizabeth Sauer. No acute change. Will continue monitor.

## 2020-04-19 LAB — CBC
HCT: 41.2 % (ref 36.0–46.0)
Hemoglobin: 12.9 g/dL (ref 12.0–15.0)
MCH: 29.5 pg (ref 26.0–34.0)
MCHC: 31.3 g/dL (ref 30.0–36.0)
MCV: 94.3 fL (ref 80.0–100.0)
Platelets: 205 10*3/uL (ref 150–400)
RBC: 4.37 MIL/uL (ref 3.87–5.11)
RDW: 15 % (ref 11.5–15.5)
WBC: 8.7 10*3/uL (ref 4.0–10.5)
nRBC: 0 % (ref 0.0–0.2)

## 2020-04-19 LAB — GLUCOSE, CAPILLARY
Glucose-Capillary: 128 mg/dL — ABNORMAL HIGH (ref 70–99)
Glucose-Capillary: 144 mg/dL — ABNORMAL HIGH (ref 70–99)
Glucose-Capillary: 110 mg/dL — ABNORMAL HIGH (ref 70–99)
Glucose-Capillary: 126 mg/dL — ABNORMAL HIGH (ref 70–99)

## 2020-04-19 LAB — BASIC METABOLIC PANEL
Anion gap: 11 (ref 5–15)
BUN: 9 mg/dL (ref 8–23)
CO2: 26 mmol/L (ref 22–32)
Calcium: 8.5 mg/dL — ABNORMAL LOW (ref 8.9–10.3)
Chloride: 104 mmol/L (ref 98–111)
Creatinine, Ser: 0.6 mg/dL (ref 0.44–1.00)
GFR calc Af Amer: >60 mL/min (ref >60)
GFR calc non Af Amer: >60 mL/min (ref >60)
Glucose, Bld: 136 mg/dL — ABNORMAL HIGH (ref 70–99)
Potassium: 3.8 mmol/L (ref 3.5–5.1)
Sodium: 141 mmol/L (ref 135–145)

## 2020-04-19 LAB — C-REACTIVE PROTEIN: CRP: 10.5 mg/dL — ABNORMAL HIGH (ref <1.0)

## 2020-04-19 LAB — PROCALCITONIN: Procalcitonin: <0.1 ng/mL

## 2020-04-19 LAB — D-DIMER, QUANTITATIVE: D-Dimer, Quant: 1.85 ug/mL-FEU — ABNORMAL HIGH (ref 0.00–0.50)

## 2020-04-19 LAB — TROPONIN I (HIGH SENSITIVITY): Troponin I (High Sensitivity): 9 ng/L (ref <18)

## 2020-04-19 MED ORDER — NITROGLYCERIN 0.4 MG SL SUBL
0.4000 mg | SUBLINGUAL_TABLET | SUBLINGUAL | Status: DC | PRN
  Administered 2020-04-19 – 2020-04-20 (×4): 0.4 mg via SUBLINGUAL
  Filled 2020-04-19 (×2): qty 1

## 2020-04-19 MED ORDER — ENOXAPARIN SODIUM 30 MG/0.3ML ~~LOC~~ SOLN
30.0000 mg | SUBCUTANEOUS | Status: DC
  Administered 2020-04-20: 30 mg via SUBCUTANEOUS
  Filled 2020-04-19: qty 0.3

## 2020-04-19 MED ORDER — METOPROLOL TARTRATE 25 MG PO TABS
25.0000 mg | ORAL_TABLET | Freq: Three times a day (TID) | ORAL | Status: DC
  Administered 2020-04-19 – 2020-04-20 (×3): 25 mg via ORAL
  Filled 2020-04-19 (×3): qty 1

## 2020-04-19 MED ORDER — FUROSEMIDE 10 MG/ML IJ SOLN
20.0000 mg | Freq: Every day | INTRAMUSCULAR | Status: DC
  Administered 2020-04-19 – 2020-04-20 (×2): 20 mg via INTRAVENOUS
  Filled 2020-04-19 (×2): qty 2

## 2020-04-19 MED ORDER — METOPROLOL TARTRATE 25 MG PO TABS: 25.0000 mg | ORAL_TABLET | Freq: Three times a day (TID) | ORAL | Status: DC

## 2020-04-19 MED ORDER — MORPHINE SULFATE (PF) 2 MG/ML IV SOLN
1.0000 mg | INTRAVENOUS | Status: DC | PRN
  Administered 2020-04-19 – 2020-04-20 (×3): 1 mg via INTRAVENOUS
  Filled 2020-04-19 (×3): qty 1

## 2020-04-19 MED ORDER — ENOXAPARIN SODIUM 30 MG/0.3ML ~~LOC~~ SOLN: 30.0000 mg | Freq: Two times a day (BID) | SUBCUTANEOUS | Status: DC

## 2020-04-19 MED ORDER — METOPROLOL TARTRATE 12.5 MG HALF TABLET: 12.5000 mg | ORAL_TABLET | Freq: Three times a day (TID) | ORAL | Status: DC

## 2020-04-19 MED ORDER — PANTOPRAZOLE SODIUM 40 MG PO TBEC
40.0000 mg | DELAYED_RELEASE_TABLET | Freq: Two times a day (BID) | ORAL | Status: DC
  Administered 2020-04-19 – 2020-04-20 (×2): 40 mg via ORAL
  Filled 2020-04-19 (×2): qty 1

## 2020-04-19 MED ORDER — MIDODRINE HCL 5 MG PO TABS
5.0000 mg | ORAL_TABLET | Freq: Three times a day (TID) | ORAL | Status: DC
  Administered 2020-04-19 – 2020-04-20 (×4): 5 mg via ORAL
  Filled 2020-04-19 (×4): qty 1

## 2020-04-19 MED ORDER — ALUM & MAG HYDROXIDE-SIMETH 200-200-20 MG/5ML PO SUSP
30.0000 mL | Freq: Four times a day (QID) | ORAL | Status: DC | PRN
  Administered 2020-04-19: 30 mL via ORAL
  Filled 2020-04-19: qty 30

## 2020-04-19 NOTE — Progress Notes (Addendum)
PROGRESS NOTE                                                                                                                                                                                                             Patient Demographics:    Christina Russell, is a 69 y.o. female, DOB - 17-Feb-1951, ZOX:[id number]  Outpatient Primary MD for the patient is [name], MD   Admit date - 03/30/2020   LOS - 20  Chief Complaint  Patient presents with  . Covid/ Distress  . COVID positive       Brief Narrative: Patient is a 69 y.o. female with PMHx of HTN, HLD, dementia-tested positive for COVID-19 on 8/12-presented to the ED on 8/15 with worsening shortness of breath-she was diagnosed with Covid 19 pneumonia and acute hypoxic respiratory failure-she was initially admitted to the ICU-and required heated high flow.  She was lysed-and transferred to [facility] service on 8/18.  Hospital course complicated by persistent severe hypoxemia, aspiration pneumonia and features compatible with failure to thrive syndrome-see below for further details  COVID-19 vaccinated status: Unvaccinated  Significant Events: 8/15>> Admit for hypoxemia secondary to COVID-19 pneumonia 8/18>> [facility] from ICU  Significant studies: 8/15>>Chest x-ray: Diffuse bilateral airspace opacities-compatible with multifocal pneumonia 8/16>> Echo: EF 50-55% 8/18>> bilateral lower extremity Doppler: No DVT. 8/18>> chest x-ray: Persistent pulmonary infiltrates consistent with multifocal pneumonia 8/23>> chest x-ray: Areas of multifocal pneumonia with consolidation greatest in the left base region, small left pleural effusion  COVID-19 medications: Steroids: 8/15>> 8/31 Remdesivir: 8/15>> 8/19 Baricitinib: 8/16>> 8/28  Antibiotics: Unasyn: 8/19>> 8/25  Microbiology data: 8/15 >>blood culture: No growth  Procedures: None  Consults: None  DVT  prophylaxis: enoxaparin (LOVENOX) injection 30 mg Start: 04/17/20 0800 SCDs Start: 03/30/20 2248     Subjective:   Appears comfortable-has plateaued for the past week or so-stable on 15 L of oxygen.  She is following commands-Per nursing staff able to eat a good chunk of her meals.   Assessment  & Plan :   Acute Hypoxic Resp Failure due to Covid 19 Viral pneumonia: Continues to have severe hypoxemia-probably in the fibrotic stage of ARDS-although on 15 L of HFNC-she is comfortable.  No signs of volume overload-but will go and start her on low-dose Lasix.  Has finished a course of steroids/baricitinib and Remdesivir-however her  CRP is trending up today-but she does not appear clinically different.  Procalcitonin is within normal limits.  Suspect she can be watched without any change in therapy.  Continued attempts to slowly titrate down FiO2.    Fever: afebrile O2 requirements:  SpO2: 98 % O2 Flow Rate (L/min): 15 L/min FiO2 (%): 100 %   COVID-19 Labs: Recent Labs    04/17/20 1050 04/18/20 0252 04/19/20 0659  DDIMER 2.29* 1.79* 1.85*  CRP  --   --  10.5*       Component Value Date/Time   BNP 46.1 04/09/2020 0701    Recent Labs  Lab 04/19/20 0659  PROCALCITON <0.10    Lab Results  Component Value Date   SARSCOV2NAA POSITIVE (A) 03/30/2020     Prone/Incentive Spirometry: encouraged  incentive spirometry use 3-4/hour.  Aspiration pneumonia: Finished a course of Unasyn-SLP following-on dysphagia 1 diet.  Elevated D-dimer: Secondary to covid 19-D-dimer-has stabilized and now is less than 2-lower extremity Dopplers negative on 8/18.  Continue prophylactic Lovenox.  Acute on chronic diastolic heart failure: Seems reasonably compensated-we will go ahead and start Lasix in an attempt to keep negative balance.  Dementia with delirium: Delirium continues to wax and wane-but overall improved.  Continue to avoid narcotics/benzodiazepines as much as possible-has been tolerating  low-dose Seroquel well.  HLD: Continue statin   Palliative care discussion: Her prognosis is guarded-this MD and other prior MDs have spoken with family-palliative care has been following.  Plan is to continue to support her as much as possible-and if she were to deteriorate significantly-they are aware that apart from transitioning to hospice care we really do not have any other options.  GI prophylaxis: PPI  ABG:    Component Value Date/Time   PHART 7.468 (H) 03/30/2020 2104   PCO2ART 39.2 03/30/2020 2104   PO2ART 49 (L) 03/30/2020 2104   HCO3 28.4 (H) 03/30/2020 2104   TCO2 30 03/30/2020 2104   O2SAT 87.0 03/30/2020 2104    Vent Settings: N/A    Condition - Extremely Guarded  Family Communication  : Granddaughter (Rodricka [phone])-unable to leave voicemail on 9/4-as mailbox full.  Code Status :  DNR  Diet :  Diet Order            DIET - DYS 1 Room service appropriate? No; Fluid consistency: Nectar Thick  Diet effective now                  Disposition Plan  :   Status is: Inpatient  Remains inpatient appropriate because:Inpatient level of care appropriate due to severity of illness   Dispo: The patient is from: Home              Anticipated d/c is to: [facility]              Anticipated d/c date is: > 3 days              Patient currently is not medically stable to d/c.    Barriers to discharge: Hypoxia requiring O2 supplementation  Antimicorbials  :    Anti-infectives (From admission, onward)   Start     Dose/Rate Route Frequency Ordered Stop   04/03/20 1000  Ampicillin-Sulbactam (UNASYN) 3 g in sodium chloride 0.9 % 100 mL IVPB        3 g 200 mL/hr over 30 Minutes Intravenous Every 6 hours 04/03/20 0935 04/09/20 1900   03/31/20 1000  remdesivir 100 mg in sodium chloride 0.9 % 100 mL  IVPB       "Followed by" Linked Group Details   100 mg 200 mL/hr over 30 Minutes Intravenous Daily 03/30/20 1445 04/03/20 0917   03/30/20 1445  remdesivir 200 mg in  sodium chloride 0.9% 250 mL IVPB       "Followed by" Linked Group Details   200 mg 580 mL/hr over 30 Minutes Intravenous Once 03/30/20 1445 03/30/20 1714      Inpatient Medications  Scheduled Meds: . albuterol  2 puff Inhalation TID  . vitamin C  500 mg Oral Daily  . atorvastatin  40 mg Oral Daily  . diclofenac Sodium  2 g Topical QID  . enoxaparin (LOVENOX) injection  30 mg Subcutaneous Q24H  . furosemide  20 mg Intravenous Daily  . mouth rinse  15 mL Mouth Rinse BID  . metoprolol tartrate  12.5 mg Oral TID  . pantoprazole  40 mg Oral Daily  . QUEtiapine  25 mg Oral QHS  . zinc sulfate  220 mg Oral Daily   Continuous Infusions: . sodium chloride Stopped (04/09/20 2034)   PRN Meds:.sodium chloride, acetaminophen, chlorpheniramine-HYDROcodone, docusate sodium, [DISCONTINUED] ondansetron **OR** ondansetron (ZOFRAN) IV, polyethylene glycol, Resource ThickenUp Clear, sodium chloride   Time Spent in minutes  25  See all Orders from today for further details   Jeoffrey Massed M.D on 04/19/2020 at 11:22 AM  To page go to [email or web address] - use universal password  [facility] -  Office  [phone]    Objective:   Vitals:   04/19/20 0409 04/19/20 0535 04/19/20 0600 04/19/20 0800  BP: 99/63  101/77 107/78  Pulse: (!) 106  (!) 114 (!) 119  Resp: 18  19 20   Temp: 97.9 F (36.6 C)  97.9 F (36.6 C) 98.5 F (36.9 C)  TempSrc: Oral  Axillary Oral  SpO2: 91%  100% 98%  Weight:  55.7 kg    Height:        Wt Readings from Last 3 Encounters:  04/19/20 55.7 kg  01/30/20 65.3 kg  12/05/19 66 kg     Intake/Output Summary (Last 24 hours) at 04/19/2020 1122 Last data filed at 04/19/2020 06/19/2020 Gross per 24 hour  Intake 440 ml  Output --  Net 440 ml     Physical Exam Gen Exam:Alert awake-not in any distress HEENT:atraumatic, normocephalic Chest: B/L clear to auscultation anteriorly CVS:S1S2 regular Abdomen:soft non tender, non distended Extremities:no  edema Neurology: Non focal Skin: no rash   Data Review:    CBC Recent Labs  Lab 04/15/20 0152 04/16/20 0140 04/17/20 1050 04/18/20 0252 04/19/20 0659  WBC 13.0* 11.3* 10.4 8.2 8.7  HGB 13.3 13.2 13.1 12.5 12.9  HCT 41.9 42.3 41.8 39.8 41.2  PLT 305 283 243 235 205  MCV 92.5 93.8 95.2 94.5 94.3  MCH 29.4 29.3 29.8 29.7 29.5  MCHC 31.7 31.2 31.3 31.4 31.3  RDW 14.9 14.9 15.1 15.0 15.0    Chemistries  Recent Labs  Lab 04/13/20 0246 04/13/20 0246 04/14/20 0324 04/14/20 0324 04/15/20 0152 04/16/20 0140 04/17/20 1050 04/18/20 0252 04/19/20 0659  NA 134*   < > 138   < > 139 141 141 140 141  K 5.0   < > 4.0   < > 4.7 4.5 4.3 3.8 3.8  CL 100   < > 101   < > 104 106 106 104 104  CO2 24   < > 27   < > 26 24 24 27 26   GLUCOSE 266*   < > 120*   < >  154* 176* 146* 95 136*  BUN 16   < > 17   < > CREATININE 0.64   < > 0.59   < > 0.69 0.65 0.60 0.62 0.60  CALCIUM 8.4*   < > 8.5*   < > 8.5* 8.6* 8.4* 8.4* 8.5*  AST 17  --  17  --   --   --   --   --   --   ALT 25  --  23  --   --   --   --   --   --   ALKPHOS 48  --  50  --   --   --   --   --   --   BILITOT 0.6  --  0.8  --   --   --   --   --   --    < > = values in this interval not displayed.   ------------------------------------------------------------------------------------------------------------------ No results for input(s): CHOL, HDL, LDLCALC, TRIG, CHOLHDL, LDLDIRECT in the last 72 hours.  Lab Results  Component Value Date   HGBA1C 5.3 04/15/2017   ------------------------------------------------------------------------------------------------------------------ No results for input(s): TSH, T4TOTAL, T3FREE, THYROIDAB in the last 72 hours.  Invalid input(s): FREET3 ------------------------------------------------------------------------------------------------------------------ No results for input(s): VITAMINB12, FOLATE, FERRITIN, TIBC, IRON, RETICCTPCT in the last 72 hours.  Coagulation  profile No results for input(s): INR, PROTIME in the last 168 hours.  Recent Labs    04/18/20 0252 04/19/20 0659  DDIMER 1.79* 1.85*    Cardiac Enzymes No results for input(s): CKMB, TROPONINI, MYOGLOBIN in the last 168 hours.  Invalid input(s): CK ------------------------------------------------------------------------------------------------------------------    Component Value Date/Time   BNP 46.1 04/09/2020 0701    Micro Results No results found for this or any previous visit (from the past 240 hour(s)).  Radiology Reports DG Chest Port 1 View  Result Date: 04/07/2020 CLINICAL DATA:  Shortness of breath.  Reported COVID-19 positive. EXAM: PORTABLE CHEST 1 VIEW COMPARISON:  April 03, 2020 FINDINGS: There is persistent airspace consolidation in the left base with small left pleural effusion. There is slightly less airspace consolidation in the right base compared to recent study. There is ill-defined opacity in the right mid lung, stable. More subtle ill-defined opacity is noted in the left mid lung. Heart size and pulmonary vascular normal. No adenopathy. There is aortic atherosclerosis. No bone lesions. IMPRESSION: Areas of multifocal pneumonia with consolidation greatest in the left base region. Small left pleural effusion. Suspect atypical organism pneumonia as most likely etiology. Superimposed bacterial pneumonia in the left base is possible. Stable cardiac silhouette. Aortic Atherosclerosis (ICD10-I70.0). Electronically Signed   By: Bretta Bang III M.D.   On: 04/07/2020 08:08   DG Chest Port 1 View  Result Date: 04/03/2020 CLINICAL DATA:  History of COVID infection EXAM: PORTABLE CHEST 1 VIEW COMPARISON:  April 02, 2020 FINDINGS: Images rotated to the LEFT. Accounting for this rotation cardiomediastinal contours are stable. Increasing opacity at the LEFT lung base compared to the prior study. Also with increased opacity at the RIGHT lung base. Air bronchograms are seen  at the LEFT lung base on today's study as well. Exam is more similar to the study of August 15. Probable small LEFT effusion as before. On limited assessment skeletal structures are unremarkable. IMPRESSION: Bilateral opacities worse at the lung bases and worse in the LEFT lung base, similar to prior imaging in this patient with reported history of COVID-19 pneumonia. Electronically Signed  By: Donzetta Kohut M.D.   On: 04/03/2020 07:54   DG Chest Port 1 View  Result Date: 04/02/2020 CLINICAL DATA:  Acute respiratory failure with hypoxemia, COVID-19 EXAM: PORTABLE CHEST 1 VIEW COMPARISON:  Portable exam 0652 hours compared 03/30/2020 FINDINGS: Normal heart size, mediastinal contours, and pulmonary vascularity. Atherosclerotic calcification aorta. Scattered pulmonary infiltrates bilaterally consistent with multifocal pneumonia most confluent in the LEFT lower lobe. No pleural effusion or pneumothorax. Mild osseous demineralization. IMPRESSION: Persistent pulmonary infiltrates consistent with multifocal pneumonia and history of COVID-19, slightly improved. Electronically Signed   By: Ulyses Southward M.D.   On: 04/02/2020 08:34   DG Chest Port 1 View  Result Date: 03/30/2020 CLINICAL DATA:  Patient with history of COVID-19. Worsening shortness of breath. EXAM: PORTABLE CHEST 1 VIEW COMPARISON:  None. FINDINGS: Monitoring leads overlie the patient. Enlarged cardiac and mediastinal contours. Diffuse bilateral airspace opacities. Probable small left pleural effusion. No pneumothorax. IMPRESSION: 1. Cardiomegaly. 2. Diffuse bilateral airspace opacities most compatible with multifocal pneumonia. Electronically Signed   By: Annia Belt M.D.   On: 03/30/2020 12:51   DG Chest Port 1V same Day  Result Date: 04/18/2020 CLINICAL DATA:  Shortness of breath. EXAM: PORTABLE CHEST 1 VIEW COMPARISON:  April 07, 2020. FINDINGS: Stable cardiomediastinal silhouette. No pneumothorax is noted. Increased bilateral airspace  opacities are noted concerning for worsening pneumonia, with possible left pleural effusion. Bony thorax is unremarkable. IMPRESSION: Increased bilateral airspace opacities are noted concerning for worsening pneumonia, with possible left pleural effusion. Aortic Atherosclerosis (ICD10-I70.0). Electronically Signed   By: Lupita Raider M.D.   On: 04/18/2020 08:26   VAS [place] LOWER EXTREMITY VENOUS (DVT)  Result Date: 04/02/2020  Lower Venous DVTStudy Other Indications: Covid positive with elevated d-dimer. Performing Technologist: Marilynne Halsted RDMS, RVT  Examination Guidelines: A complete evaluation includes B-mode imaging, spectral Doppler, color Doppler, and power Doppler as needed of all accessible portions of each vessel. Bilateral testing is considered an integral part of a complete examination. Limited examinations for reoccurring indications may be performed as noted. The reflux portion of the exam is performed with the patient in reverse Trendelenburg.  +---------+---------------+---------+-----------+----------+--------------+ RIGHT    CompressibilityPhasicitySpontaneityPropertiesThrombus Aging +---------+---------------+---------+-----------+----------+--------------+ CFV      Full           Yes      Yes                                 +---------+---------------+---------+-----------+----------+--------------+ SFJ      Full                                                        +---------+---------------+---------+-----------+----------+--------------+ FV Prox  Full                                                        +---------+---------------+---------+-----------+----------+--------------+ FV Mid   Full                                                        +---------+---------------+---------+-----------+----------+--------------+  FV DistalFull                                                         +---------+---------------+---------+-----------+----------+--------------+ PFV      Full                                                        +---------+---------------+---------+-----------+----------+--------------+ POP      Full           Yes      Yes                                 +---------+---------------+---------+-----------+----------+--------------+ PTV      Full                                                        +---------+---------------+---------+-----------+----------+--------------+ PERO     Full                                                        +---------+---------------+---------+-----------+----------+--------------+   +---------+---------------+---------+-----------+----------+--------------+ LEFT     CompressibilityPhasicitySpontaneityPropertiesThrombus Aging +---------+---------------+---------+-----------+----------+--------------+ CFV      Full           Yes      Yes                                 +---------+---------------+---------+-----------+----------+--------------+ SFJ      Full                                                        +---------+---------------+---------+-----------+----------+--------------+ FV Prox  Full                                                        +---------+---------------+---------+-----------+----------+--------------+ FV Mid   Full                                                        +---------+---------------+---------+-----------+----------+--------------+ FV DistalFull                                                        +---------+---------------+---------+-----------+----------+--------------+  PFV      Full                                                        +---------+---------------+---------+-----------+----------+--------------+ POP      Full           Yes      Yes                                  +---------+---------------+---------+-----------+----------+--------------+ PTV      Full                                                        +---------+---------------+---------+-----------+----------+--------------+ PERO     Full                                                        +---------+---------------+---------+-----------+----------+--------------+     Summary: BILATERAL: - No evidence of deep vein thrombosis seen in the lower extremities, bilaterally. -No evidence of popliteal cyst, bilaterally.   *See table(s) above for measurements and observations. Electronically signed by Gretta Began MD on 04/02/2020 at 5:04:02 PM.    Final    ECHOCARDIOGRAM LIMITED  Result Date: 03/31/2020    ECHOCARDIOGRAM LIMITED REPORT   Patient Name:   ISEL TRUGLIO Date of Exam: 03/31/2020 Medical Rec #:  [id number]     Height:       62.0 in Accession #:    [id number]    Weight:       142.2 lb Date of Birth:  [birth year]     BSA:          1.654 m Patient Age:    68 years      BP:           136/80 mmHg Patient Gender: F             HR:           86 bpm. Exam Location:  Inpatient Procedure: Limited Echo, Color Doppler and Cardiac Doppler Indications:    R06.9 DOE  History:        Patient has no prior history of Echocardiogram examinations.                 Risk Factors:Dyslipidemia.  Sonographer:    Irving Burton Senior RDCS Referring Phys: Humberto Seals  Sonographer Comments: COVID+ at time of study IMPRESSIONS  1. Abnormal septal motion with distal hypokinesis . Left ventricular ejection fraction, by estimation, is 50 to 55%. The left ventricle has low normal function. Left ventricular endocardial border not optimally defined to evaluate regional wall motion. Left ventricular diastolic parameters are indeterminate.  2. The mitral valve is normal in structure. Mild mitral valve regurgitation.  3. The aortic valve is tricuspid. Aortic valve regurgitation is not visualized. Mild aortic valve sclerosis is present,  with no evidence of aortic valve stenosis.  4. There is normal pulmonary artery systolic  pressure.  5. The inferior vena cava is normal in size with greater than 50% respiratory variability, suggesting right atrial pressure of 3 mmHg. FINDINGS  Left Ventricle: Abnormal septal motion with distal hypokinesis. Left ventricular ejection fraction, by estimation, is 50 to 55%. The left ventricle has low normal function. Left ventricular endocardial border not optimally defined to evaluate regional wall motion. The left ventricular internal cavity size was normal in size. Right Ventricle: There is normal pulmonary artery systolic pressure. The tricuspid regurgitant velocity is 2.33 m/s, and with an assumed right atrial pressure of 3 mmHg, the estimated right ventricular systolic pressure is 24.7 mmHg. Mitral Valve: The mitral valve is normal in structure. There is mild thickening of the mitral valve leaflet(s). There is mild calcification of the mitral valve leaflet(s). Mild mitral annular calcification. Mild mitral valve regurgitation. Tricuspid Valve: The tricuspid valve is normal in structure. Tricuspid valve regurgitation is mild. Aortic Valve: The aortic valve is tricuspid. Aortic valve regurgitation is not visualized. Mild aortic valve sclerosis is present, with no evidence of aortic valve stenosis. Venous: The inferior vena cava is normal in size with greater than 50% respiratory variability, suggesting right atrial pressure of 3 mmHg. IAS/Shunts: The interatrial septum was not well visualized. RIGHT VENTRICLE RV S prime:     10.80 cm/s TAPSE (M-mode): 1.7 cm AORTIC VALVE LVOT Vmax:   95.30 cm/s LVOT Vmean:  65.900 cm/s LVOT VTI:    0.180 m MITRAL VALVE               TRICUSPID VALVE MV Area (PHT): 4.21 cm    TR Peak grad:   21.7 mmHg MV Decel Time: 180 msec    TR Vmax:        233.00 cm/s MV E velocity: 57.70 cm/s MV A velocity: 73.20 cm/s  SHUNTS MV E/A ratio:  0.79        Systemic VTI: 0.18 m Charlton Haws MD  Electronically signed by Charlton Haws MD Signature Date/Time: 03/31/2020/3:13:28 PM    Final

## 2020-04-19 NOTE — Progress Notes (Signed)
Pt is AOx2. VS per flowsheet. Complained of chest pain near the end of the shift. Attending notified. EKG was obtained. Nitro x3 given with no positive effect. Morphine given with some positive effect. MAALOX given with no positive effect. During pt's Episode of chest pain non-rebreather was added on on top of hi-flow, due to low O2 Sat of 83%. Pt is currently not as agitated and not complaining of chest pain anymore. See flowsheet for full assessment. Safety maintained. Call light within reach. Will continue to monitor.

## 2020-04-20 ENCOUNTER — Inpatient Hospital Stay (HOSPITAL_COMMUNITY): Payer: Medicare Other

## 2020-04-20 DIAGNOSIS — R0600 Dyspnea, unspecified: Secondary | ICD-10-CM

## 2020-04-20 LAB — COMPREHENSIVE METABOLIC PANEL
ALT: 14 U/L (ref 0–44)
AST: 18 U/L (ref 15–41)
Albumin: 2.3 g/dL — ABNORMAL LOW (ref 3.5–5.0)
Alkaline Phosphatase: 52 U/L (ref 38–126)
Anion gap: 13 (ref 5–15)
BUN: 13 mg/dL (ref 8–23)
CO2: 25 mmol/L (ref 22–32)
Calcium: 8.7 mg/dL — ABNORMAL LOW (ref 8.9–10.3)
Chloride: 101 mmol/L (ref 98–111)
Creatinine, Ser: 0.66 mg/dL (ref 0.44–1.00)
GFR calc Af Amer: >60 mL/min (ref >60)
GFR calc non Af Amer: >60 mL/min (ref >60)
Glucose, Bld: 118 mg/dL — ABNORMAL HIGH (ref 70–99)
Potassium: 4.5 mmol/L (ref 3.5–5.1)
Sodium: 139 mmol/L (ref 135–145)
Total Bilirubin: 1.1 mg/dL (ref 0.3–1.2)
Total Protein: 6.7 g/dL (ref 6.5–8.1)

## 2020-04-20 LAB — MAGNESIUM: Magnesium: 2.3 mg/dL (ref 1.7–2.4)

## 2020-04-20 LAB — CBC
HCT: 44.9 % (ref 36.0–46.0)
Hemoglobin: 14 g/dL (ref 12.0–15.0)
MCH: 30.2 pg (ref 26.0–34.0)
MCHC: 31.2 g/dL (ref 30.0–36.0)
MCV: 97 fL (ref 80.0–100.0)
Platelets: 186 10*3/uL (ref 150–400)
RBC: 4.63 MIL/uL (ref 3.87–5.11)
RDW: 15.4 % (ref 11.5–15.5)
WBC: 11.6 10*3/uL — ABNORMAL HIGH (ref 4.0–10.5)
nRBC: 0 % (ref 0.0–0.2)

## 2020-04-20 LAB — D-DIMER, QUANTITATIVE: D-Dimer, Quant: 1.7 ug/mL-FEU — ABNORMAL HIGH (ref 0.00–0.50)

## 2020-04-20 LAB — C-REACTIVE PROTEIN: CRP: 12.8 mg/dL — ABNORMAL HIGH (ref <1.0)

## 2020-04-20 LAB — TROPONIN I (HIGH SENSITIVITY): Troponin I (High Sensitivity): 8 ng/L (ref <18)

## 2020-04-20 MED ORDER — SODIUM CHLORIDE 0.9% FLUSH: 3.0000 mL | INTRAVENOUS | Status: DC | PRN

## 2020-04-20 MED ORDER — SODIUM CHLORIDE 0.9 % IV SOLN
12.5000 mg | Freq: Four times a day (QID) | INTRAVENOUS | Status: DC | PRN
  Filled 2020-04-20: qty 0.5

## 2020-04-20 MED ORDER — MORPHINE BOLUS VIA INFUSION
4.0000 mg | INTRAVENOUS | Status: DC | PRN
  Administered 2020-04-20 – 2020-04-21 (×4): 4 mg via INTRAVENOUS
  Filled 2020-04-20: qty 4

## 2020-04-20 MED ORDER — MORPHINE 100MG IN NS 100ML (1MG/ML) PREMIX INFUSION
2.0000 mg/h | INTRAVENOUS | Status: DC
  Administered 2020-04-20: 2 mg/h via INTRAVENOUS
  Administered 2020-04-21: 3 mg/h via INTRAVENOUS
  Filled 2020-04-20 (×2): qty 100

## 2020-04-20 MED ORDER — SODIUM CHLORIDE 0.9 % IV SOLN: 250.0000 mL | INTRAVENOUS | Status: DC | PRN

## 2020-04-20 MED ORDER — ONDANSETRON 4 MG PO TBDP: 4.0000 mg | ORAL_TABLET | Freq: Four times a day (QID) | ORAL | Status: DC | PRN

## 2020-04-20 MED ORDER — MORPHINE SULFATE (PF) 4 MG/ML IV SOLN
4.0000 mg | Freq: Once | INTRAVENOUS | Status: AC
  Administered 2020-04-20: 4 mg via INTRAVENOUS
  Filled 2020-04-20: qty 1

## 2020-04-20 MED ORDER — LORAZEPAM 2 MG/ML IJ SOLN
1.0000 mg | INTRAMUSCULAR | Status: DC | PRN
  Administered 2020-04-20 – 2020-04-21 (×2): 1 mg via INTRAVENOUS
  Filled 2020-04-20 (×2): qty 1

## 2020-04-20 MED ORDER — ATROPINE SULFATE 1 % OP SOLN
4.0000 [drp] | OPHTHALMIC | Status: DC | PRN
  Filled 2020-04-20: qty 2

## 2020-04-20 MED ORDER — SODIUM CHLORIDE 0.9% FLUSH
3.0000 mL | Freq: Two times a day (BID) | INTRAVENOUS | Status: DC
  Administered 2020-04-20: 3 mL via INTRAVENOUS

## 2020-04-20 MED ORDER — HALOPERIDOL LACTATE 2 MG/ML PO CONC
0.5000 mg | ORAL | Status: DC | PRN
  Filled 2020-04-20: qty 0.3

## 2020-04-20 MED ORDER — MORPHINE SULFATE (PF) 2 MG/ML IV SOLN
2.0000 mg | INTRAVENOUS | Status: AC | PRN
  Administered 2020-04-20: 2 mg via INTRAVENOUS
  Filled 2020-04-20: qty 1

## 2020-04-20 MED ORDER — MORPHINE BOLUS VIA INFUSION
3.0000 mg | INTRAVENOUS | Status: DC | PRN
  Administered 2020-04-20 (×2): 3 mg via INTRAVENOUS
  Filled 2020-04-20: qty 3

## 2020-04-20 MED ORDER — HALOPERIDOL LACTATE 5 MG/ML IJ SOLN: 0.5000 mg | INTRAMUSCULAR | Status: DC | PRN

## 2020-04-20 MED ORDER — GLYCOPYRROLATE 0.2 MG/ML IJ SOLN: 0.2000 mg | INTRAMUSCULAR | Status: DC | PRN

## 2020-04-20 MED ORDER — HALOPERIDOL 0.5 MG PO TABS
0.5000 mg | ORAL_TABLET | ORAL | Status: DC | PRN
  Filled 2020-04-20: qty 1

## 2020-04-20 MED ORDER — ONDANSETRON HCL 4 MG/2ML IJ SOLN: 4.0000 mg | Freq: Four times a day (QID) | INTRAMUSCULAR | Status: DC | PRN

## 2020-04-20 MED ORDER — MORPHINE SULFATE (PF) 4 MG/ML IV SOLN
4.0000 mg | INTRAVENOUS | Status: DC | PRN
  Administered 2020-04-20: 4 mg via INTRAVENOUS
  Filled 2020-04-20: qty 1

## 2020-04-20 MED ORDER — LORAZEPAM 2 MG/ML PO CONC: 1.0000 mg | ORAL | Status: DC | PRN

## 2020-04-20 MED ORDER — LORAZEPAM 1 MG PO TABS: 1.0000 mg | ORAL_TABLET | ORAL | Status: DC | PRN

## 2020-04-20 NOTE — Progress Notes (Signed)
At approximately 0200 pt began to cry out and say she was in pain. In addition, pt's O2 sats began to decline as low as 77%, reassuring measures provided as well as turning pt to side in attempt to prone. Pt did not tolerate this well and stated that she was in "pain all over". MD paged and notified of increased pain and morphine ordered. Pt currently in bed, eyes closed and no s/s of pain or distress.

## 2020-04-20 NOTE — Progress Notes (Addendum)
Daily Progress Note   Patient Name: Christina Russell       Date: 04/20/2020 DOB: 01/07/1951  Age: 69 y.o. MRN#: [id number] Attending Physician: Maretta Bees, MD Primary Care Physician: [name], MD Admit Date: 03/30/2020  Reason for Consultation/Follow-up: Establishing goals of care  Subjective/GOC: Request for PMT re-involvement by Dr. Jerral Ralph. Patient is declining and uncomfortable with tachypnea, chest pain, poor oral intake, consistently requiring NRB mask and 15L HFNC.   Dr. Jerral Ralph spoke with daughter and granddaughter. Decision made for transition to comfort measures and initiation of morphine infusion.   PMT provider f/u with granddaughter and daughter via telephone. Discussed plan for transition to comfort measures and emphasis on symptom management medication to ensure comfort and relief from suffering. Discussed visitor policy and family is on their way now. Discussed eventual titration down of oxygen once she is comfortable on morphine infusion so we do not prolong her suffering. Therapeutic listening and emotional/spiritual support provided. Answered questions.   Discussed with RN in detail.  Length of Stay: 21  Current Medications: Scheduled Meds:  . sodium chloride flush  3 mL Intravenous Q12H    Continuous Infusions: . sodium chloride    . chlorproMAZINE (THORAZINE) IV    . morphine      PRN Meds: sodium chloride, atropine, chlorproMAZINE (THORAZINE) IV, haloperidol **OR** haloperidol **OR** haloperidol lactate, LORazepam **OR** LORazepam **OR** LORazepam, morphine, ondansetron **OR** ondansetron (ZOFRAN) IV, sodium chloride flush  Physical Exam        Vital Signs: BP 112/69   Pulse (!) 135   Temp 98.2 F (36.8 C) (Axillary)   Resp 20   Ht 5\' 2"  (1.575 m)    Wt 55.7 kg   SpO2 (!) 88%   BMI 22.46 kg/m  SpO2: SpO2: (!) 88 % O2 Device: O2 Device: High Flow Nasal Cannula, NRB O2 Flow Rate: O2 Flow Rate (L/min): 15 L/min  Intake/output summary:   Intake/Output Summary (Last 24 hours) at 04/20/2020 1155 Last data filed at 04/20/2020 0828 Gross per 24 hour  Intake 60 ml  Output 900 ml  Net -840 ml   LBM: Last BM Date: 04/15/20 Baseline Weight: Weight: 65.3 kg Most recent weight: Weight: 55.7 kg       Palliative Assessment/Data: PPS 20%    Flowsheet Rows     Most Recent Value  Intake Tab  Referral Department Critical care  Unit at Time of Referral ICU  Palliative Care Primary Diagnosis Sepsis/Infectious Disease  Date Notified 04/01/20  Palliative Care Type New Palliative care  Reason for referral Clarify Goals of Care  Date of Admission 03/30/20  Date first seen by Palliative Care 04/06/20  # of days Palliative referral response time 5 Day(s)  # of days IP prior to Palliative referral 2  Clinical Assessment  Psychosocial & Spiritual Assessment  Palliative Care Outcomes      Patient Active Problem List   Diagnosis Date Noted  . Acute respiratory failure with hypoxia (HCC)   . Palliative care by specialist   . Acute respiratory failure due to COVID-19 (HCC) 03/30/2020  . Pneumonia due to COVID-19 virus 03/30/2020  . COVID-19 03/30/2020  . Dementia (HCC)   . Hyperlipidemia 12/05/2019  . Estrogen deficiency 10/20/2018  . Goals of care, counseling/discussion 10/20/2018  . Urinary frequency 11/30/2017  . Trigger middle finger of left hand 11/30/2017  . Seizures (HCC) 11/29/2017    Palliative Care Assessment & Plan   Patient Profile: 69 y.o. female  with past medical history of dementia and hyperlipidemia admitted on 03/30/2020 with shortness of breath. Hospital admission for acute hypoxic respiratory failure secondary to COVID-19 pneumonia and developed CHF. Not clinically progressing despite aggressive medical management.  Remains on high flow 55L. Patient is a DNR/DNI. Palliative medicine consultation for goals of are.   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 After conversations with family, decision made for transition to comfort measure only. Family understands diagnoses and poor prognosis.   Initiate morphine infusion per attending. PRN comfort medications on MAR.  Allow family visitation as patient is nearing EOL.  Comfort feeds per patient/family request.  Start titrating oxygen down once family visits and once patient appears comfortable on morphine infusion as her high oxygen requirements may prolong suffering. Discussed with daughter and RN.   Anticipate hospital demise.   Code Status: DNR   Code Status Orders  (From admission, onward)         Start     Ordered   04/03/20 0925  Do not attempt resuscitation (DNR)  Continuous       Question Answer Comment  In the event of cardiac or respiratory ARREST Do not call a "code blue"   In the event of cardiac or respiratory ARREST Do not perform Intubation, CPR, defibrillation or ACLS   In the event of cardiac or respiratory ARREST Use medication by any route, position, wound care, and other measures to relive pain and suffering. May use oxygen, suction and manual treatment of airway obstruction as needed for comfort.      04/03/20 0924        Code Status History    Date Active Date Inactive Code Status Order ID Comments User Context   03/30/2020 2248 04/03/2020 0924 Full Code [id number]  Gleason, Markus Jarvis ED   03/30/2020 1445 03/30/2020 2248 Full Code [id number]  Lucile Shutters, MD ED   Advance Care Planning Activity       Prognosis:  Hours-days  Discharge Planning:  Anticipated Hospital Death  Care plan was discussed with Dr. Jerral Ralph, RN, daughter,  granddaughter  Thank you for allowing the Palliative Medicine Team to assist in the care of this patient.   Total Time 20 Prolonged Time Billed no    Greater than 50%  of this time was spent counseling and coordinating care related to the above assessment and plan.   The above conversation was completed via telephone due to visitor restrictions during COVID-19 pandemic. Thorough chart review and discussion with multidisciplinary team was completed as part of assessment. No physical examination was performed.   Vennie Homans, DNP, FNP-C Palliative Medicine Team  Phone: [phone] Fax: [phone]  Please contact Palliative Medicine Team phone at [phone] for questions and concerns.

## 2020-04-20 NOTE — Progress Notes (Signed)
MD paged and alerted that pt is c/o chest pain, morphine 1 gm given per order and 1x dose of nitro given per order. Pt is also tachypnic and sats maintaining approx 84%. MD ordered stat EKG, stat morphine 2mg , CXR and to page respiratory. Respiratory was paged, pt currently satting at 88%, RRT said they would defer to dayshift. Will endorse to oncoming RN.

## 2020-04-20 NOTE — Progress Notes (Signed)
Called to patient's room for assistance. Upon arrival, daughter was distraught and stating she wanted to see the doctor so that she can take her mother home immediately. Daughter requesting O2 tank to take home with patient. Charge nurse called to witness conversation. Gently explained to the daughter that with her mother's O2 demand, it would not be wise to take her home in the middle of the night. Also mentioned that the patient would not have the Morphine drip at home if discharged at this time. The patient was comfortably resting, so it was advised that the conversation should be re-visited during business hours.

## 2020-04-20 NOTE — Progress Notes (Signed)
Spoke with granddaughter again-she asked me to talk to her mother Christina Russell.  I reached out to Christina Russell-explained continued clinical decline-and that patient was more symptomatic and was clearly suffering.  Explained process of transitioning to comfort measures-Christina Russell was in agreement-we will start comfort measures-we will ask nursing staff to allow family visitation.

## 2020-04-20 NOTE — Progress Notes (Addendum)
PROGRESS NOTE                                                                                                                                                                                                             Patient Demographics:    Christina Russell, is a 69 y.o. female, DOB - [birth year], UJW:[id number]  Outpatient Primary MD for the patient is [name], MD   Admit date - 03/30/2020   LOS - 21  Chief Complaint  Patient presents with  . Covid/ Distress  . COVID positive       Brief Narrative: Patient is a 68 y.o. female with PMHx of HTN, HLD, dementia-tested positive for COVID-19 on 8/12-presented to the ED on 8/15 with worsening shortness of breath-she was diagnosed with Covid 19 pneumonia and acute hypoxic respiratory failure-she was initially admitted to the ICU-and required heated high flow.  She was lysed-and transferred to [facility] service on 8/18.  Hospital course complicated by persistent severe hypoxemia, aspiration pneumonia and features compatible with failure to thrive syndrome-see below for further details  COVID-19 vaccinated status: Unvaccinated  Significant Events: 8/15>> Admit for hypoxemia secondary to COVID-19 pneumonia 8/18>> transfer to [facility] from ICU  Significant studies: 8/15>>Chest x-ray: Diffuse bilateral airspace opacities-compatible with multifocal pneumonia 8/16>> Echo: EF 50-55% 8/18>> bilateral lower extremity Doppler: No DVT. 8/18>> chest x-ray: Persistent pulmonary infiltrates consistent with multifocal pneumonia 8/23>> chest x-ray: Areas of multifocal pneumonia with consolidation greatest in the left base region, small left pleural effusion  COVID-19 medications: Steroids: 8/15>> 8/31 Remdesivir: 8/15>> 8/19 Baricitinib: 8/16>> 8/28  Antibiotics: Unasyn: 8/19>> 8/25  Microbiology data: 8/15 >>blood culture: No growth  Procedures: None  Consults: None  DVT  prophylaxis: enoxaparin (LOVENOX) injection 30 mg Start: 04/20/20 1000 SCDs Start: 03/30/20 2248    Subjective:   Has worsened since last evening-more hypoxic-consistently requiring NRB in addition to 15 L of HFNC-with saturations mostly in the mid to low 80s this morning.  At times O2 saturation will stabilize in the high 80s/low 90s.  She appears more uncomfortable-somewhat tachypneic compared to yesterday.  Complains of feeling tired and just not doing well.  She also complains of bilateral chest pain.  Per nursing staff only eating around 20-30% of her meals.   Assessment  & Plan :   Acute Hypoxic Resp Failure due to Covid 19  Viral pneumonia: Unfortunately continues to have severe hypoxemia with more worsening yesterday.  Suspect is in the fibrotic stage of ARDS.  Requiring 15 L of HFNC and NRB since yesterday evening.  She seems to be more symptomatic with tachypnea, and feels just more uncomfortable today.  Spoke with granddaughter Rodrick over the phone-recommendations are to proceed with initiation of comfort measures-as we really do not have any other options in this frail 69 year old demented lady who is deteriorating in spite of maximal medical care.  She is a DNR.  Family is okay with [place] initiating comfort measures-granddaughter will call us back after she talks with other family members.  I have asked palliative care to follow-up with family as well today.   Fever: afebrile O2 requirements:  SpO2: (!) 88 % O2 Flow Rate (L/min): 15 L/min FiO2 (%): 100 %   COVID-19 Labs: Recent Labs    04/18/20 0252 04/19/20 0659 04/20/20 0140 04/20/20 0900  DDIMER 1.79* 1.85*  --  1.70*  CRP  --  10.5* 12.8*  --        Component Value Date/Time   BNP 46.1 04/09/2020 0701    Recent Labs  Lab 04/19/20 0659  PROCALCITON <0.10    Lab Results  Component Value Date   SARSCOV2NAA POSITIVE (A) 03/30/2020     Prone/Incentive Spirometry: encouraged  incentive spirometry use  3-4/hour.  Aspiration pneumonia: Finished a course of Unasyn-SLP following-on dysphagia 1 diet.  Elevated D-dimer: Secondary to covid 19-D-dimer-has stabilized and now is less than 2-lower extremity Dopplers negative on 8/18.  Continue prophylactic Lovenox.  Acute on chronic diastolic heart failure: Seems reasonably compensated-we will go ahead and start Lasix in an attempt to keep negative balance.  Dementia with delirium: Delirium continues to wax and wane-but overall improved.  Continue to avoid narcotics/benzodiazepines as much as possible-has been tolerating low-dose Seroquel well.  HLD: Continue statin   Palliative care discussion: Prolonged hospitalization-DNR in place-multiple medical problems as outlined above-now deteriorating slowly after a period of plateauing with severe hypoxemia.  She is more symptomatic today-complaining of bilateral chest pain, tiredness, more tachypneic with worsening hypoxemia.  Spoke with granddaughter Rodrick on 9/5-we will go and start comfort measures.  GI prophylaxis: PPI  ABG:    Component Value Date/Time   PHART 7.468 (H) 03/30/2020 2104   PCO2ART 39.2 03/30/2020 2104   PO2ART 49 (L) 03/30/2020 2104   HCO3 28.4 (H) 03/30/2020 2104   TCO2 30 03/30/2020 2104   O2SAT 87.0 03/30/2020 2104    Vent Settings: N/A    Condition - Extremely Guarded  Family Communication  : Granddaughter (Christina Russell [phone])-updated over the phone on 9/5  Code Status :  DNR  Diet :  Diet Order            DIET - DYS 1 Room service appropriate? No; Fluid consistency: Nectar Thick  Diet effective now                  Disposition Plan  :   Status is: Inpatient  Remains inpatient appropriate because:Inpatient level of care appropriate due to severity of illness   Dispo: The patient is from: Home              Anticipated d/c is to: [facility]              Anticipated d/c date is: > 3 days              Patient currently is not medically stable to  d/c.  Barriers to discharge: Hypoxia requiring O2 supplementation  Antimicorbials  :    Anti-infectives (From admission, onward)   Start     Dose/Rate Route Frequency Ordered Stop   04/03/20 1000  Ampicillin-Sulbactam (UNASYN) 3 g in sodium chloride 0.9 % 100 mL IVPB        3 g 200 mL/hr over 30 Minutes Intravenous Every 6 hours 04/03/20 0935 04/09/20 1900   03/31/20 1000  remdesivir 100 mg in sodium chloride 0.9 % 100 mL IVPB       "Followed by" Linked Group Details   100 mg 200 mL/hr over 30 Minutes Intravenous Daily 03/30/20 1445 04/03/20 0917   03/30/20 1445  remdesivir 200 mg in sodium chloride 0.9% 250 mL IVPB       "Followed by" Linked Group Details   200 mg 580 mL/hr over 30 Minutes Intravenous Once 03/30/20 1445 03/30/20 1714      Inpatient Medications  Scheduled Meds: . albuterol  2 puff Inhalation TID  . vitamin C  500 mg Oral Daily  . atorvastatin  40 mg Oral Daily  . diclofenac Sodium  2 g Topical QID  . enoxaparin (LOVENOX) injection  30 mg Subcutaneous Q24H  . furosemide  20 mg Intravenous Daily  . mouth rinse  15 mL Mouth Rinse BID  . metoprolol tartrate  25 mg Oral TID  . midodrine  5 mg Oral TID WC  . pantoprazole  40 mg Oral BID  . QUEtiapine  25 mg Oral QHS  . zinc sulfate  220 mg Oral Daily   Continuous Infusions: . sodium chloride Stopped (04/09/20 2034)   PRN Meds:.sodium chloride, acetaminophen, alum & mag hydroxide-simeth, chlorpheniramine-HYDROcodone, docusate sodium, morphine injection, nitroGLYCERIN, [DISCONTINUED] ondansetron **OR** ondansetron (ZOFRAN) IV, polyethylene glycol, Resource ThickenUp Clear, sodium chloride   Time Spent in minutes  25  See all Orders from today for further details   Jeoffrey Massed M.D on 04/20/2020 at 10:58 AM  To page go to [email or web address] - use universal password  [facility] -  Office  [phone]    Objective:   Vitals:   04/20/20 0600 04/20/20 0610 04/20/20 0725 04/20/20 0731  BP: 134/76    112/69  Pulse: (!) 129 (!) 124  (!) 135  Resp: (!) 23 20    Temp:   98.2 F (36.8 C)   TempSrc:   Axillary   SpO2: (!) 80% (!) 88%    Weight:      Height:        Wt Readings from Last 3 Encounters:  04/19/20 55.7 kg  01/30/20 65.3 kg  12/05/19 66 kg     Intake/Output Summary (Last 24 hours) at 04/20/2020 1058 Last data filed at 04/20/2020 0828 Gross per 24 hour  Intake 60 ml  Output 900 ml  Net -840 ml     Physical Exam Gen Exam: More confused -uncomfortable has been more tachypneic. HEENT:atraumatic, normocephalic Chest: Transmitted upper airway sounds. CVS:S1S2 regular Abdomen:soft non tender, non distended Extremities:no edema Neurology: Moves all 4 extremities. Skin: no rash   Data Review:    CBC Recent Labs  Lab 04/16/20 0140 04/17/20 1050 04/18/20 0252 04/19/20 0659 04/20/20 0140  WBC 11.3* 10.4 8.2 8.7 11.6*  HGB 13.2 13.1 12.5 12.9 14.0  HCT 42.3 41.8 39.8 41.2 44.9  PLT 283 243 235 205 186  MCV 93.8 95.2 94.5 94.3 97.0  MCH 29.3 29.8 29.7 29.5 30.2  MCHC 31.2 31.3 31.4 31.3 31.2  RDW 14.9 15.1 15.0 15.0 15.4  Chemistries  Recent Labs  Lab 04/14/20 0324 04/15/20 0152 04/16/20 0140 04/17/20 1050 04/18/20 0252 04/19/20 0659 04/20/20 0140  NA 138   < > 141 141 140 141 139  K 4.0   < > 4.5 4.3 3.8 3.8 4.5  CL 101   < > 106 106 104 104 101  CO2 27   < > 24 24 27 26 25   GLUCOSE 120*   < > 176* 146* 95 136* 118*  BUN 17   < > 15 12 11 9 13   CREATININE 0.59   < > 0.65 0.60 0.62 0.60 0.66  CALCIUM 8.5*   < > 8.6* 8.4* 8.4* 8.5* 8.7*  MG  --   --   --   --   --   --  2.3  AST 17  --   --   --   --   --  18  ALT 23  --   --   --   --   --  14  ALKPHOS 50  --   --   --   --   --  52  BILITOT 0.8  --   --   --   --   --  1.1   < > = values in this interval not displayed.   ------------------------------------------------------------------------------------------------------------------ No results for input(s): CHOL, HDL, LDLCALC, TRIG,  CHOLHDL, LDLDIRECT in the last 72 hours.  Lab Results  Component Value Date   HGBA1C 5.3 04/15/2017   ------------------------------------------------------------------------------------------------------------------ No results for input(s): TSH, T4TOTAL, T3FREE, THYROIDAB in the last 72 hours.  Invalid input(s): FREET3 ------------------------------------------------------------------------------------------------------------------ No results for input(s): VITAMINB12, FOLATE, FERRITIN, TIBC, IRON, RETICCTPCT in the last 72 hours.  Coagulation profile No results for input(s): INR, PROTIME in the last 168 hours.  Recent Labs    04/19/20 0659 04/20/20 0900  DDIMER 1.85* 1.70*    Cardiac Enzymes No results for input(s): CKMB, TROPONINI, MYOGLOBIN in the last 168 hours.  Invalid input(s): CK ------------------------------------------------------------------------------------------------------------------    Component Value Date/Time   BNP 46.1 04/09/2020 0701    Micro Results No results found for this or any previous visit (from the past 240 hour(s)).  Radiology Reports DG CHEST PORT 1 VIEW  Result Date: 04/20/2020 CLINICAL DATA:  COVID positive patient. Evaluate airspace lung opacities. EXAM: PORTABLE CHEST 1 VIEW COMPARISON:  04/18/2020 and earlier studies. FINDINGS: Bilateral airspace lung opacities are without change from the prior study. No new lung abnormalities. Cannot exclude a small pleural effusion, but no pleural effusion is definitively seen. No pneumothorax. IMPRESSION: 1. No interval change from the most recent prior study. Persistent bilateral airspace lung opacities consistent with multifocal COVID-19 pneumonia. No definite pleural effusion. Electronically Signed   By: Amie Portland M.D.   On: 04/20/2020 08:41   DG Chest Port 1 View  Result Date: 04/07/2020 CLINICAL DATA:  Shortness of breath.  Reported COVID-19 positive. EXAM: PORTABLE CHEST 1 VIEW COMPARISON:   April 03, 2020 FINDINGS: There is persistent airspace consolidation in the left base with small left pleural effusion. There is slightly less airspace consolidation in the right base compared to recent study. There is ill-defined opacity in the right mid lung, stable. More subtle ill-defined opacity is noted in the left mid lung. Heart size and pulmonary vascular normal. No adenopathy. There is aortic atherosclerosis. No bone lesions. IMPRESSION: Areas of multifocal pneumonia with consolidation greatest in the left base region. Small left pleural effusion. Suspect atypical organism pneumonia as most likely etiology. Superimposed bacterial pneumonia  in the left base is possible. Stable cardiac silhouette. Aortic Atherosclerosis (ICD10-I70.0). Electronically Signed   By: Bretta Bang III M.D.   On: 04/07/2020 08:08   DG Chest Port 1 View  Result Date: 04/03/2020 CLINICAL DATA:  History of COVID infection EXAM: PORTABLE CHEST 1 VIEW COMPARISON:  April 02, 2020 FINDINGS: Images rotated to the LEFT. Accounting for this rotation cardiomediastinal contours are stable. Increasing opacity at the LEFT lung base compared to the prior study. Also with increased opacity at the RIGHT lung base. Air bronchograms are seen at the LEFT lung base on today's study as well. Exam is more similar to the study of August 15. Probable small LEFT effusion as before. On limited assessment skeletal structures are unremarkable. IMPRESSION: Bilateral opacities worse at the lung bases and worse in the LEFT lung base, similar to prior imaging in this patient with reported history of COVID-19 pneumonia. Electronically Signed   By: Donzetta Kohut M.D.   On: 04/03/2020 07:54   DG Chest Port 1 View  Result Date: 04/02/2020 CLINICAL DATA:  Acute respiratory failure with hypoxemia, COVID-19 EXAM: PORTABLE CHEST 1 VIEW COMPARISON:  Portable exam 0652 hours compared 03/30/2020 FINDINGS: Normal heart size, mediastinal contours, and pulmonary  vascularity. Atherosclerotic calcification aorta. Scattered pulmonary infiltrates bilaterally consistent with multifocal pneumonia most confluent in the LEFT lower lobe. No pleural effusion or pneumothorax. Mild osseous demineralization. IMPRESSION: Persistent pulmonary infiltrates consistent with multifocal pneumonia and history of COVID-19, slightly improved. Electronically Signed   By: Ulyses Southward M.D.   On: 04/02/2020 08:34   DG Chest Port 1 View  Result Date: 03/30/2020 CLINICAL DATA:  Patient with history of COVID-19. Worsening shortness of breath. EXAM: PORTABLE CHEST 1 VIEW COMPARISON:  None. FINDINGS: Monitoring leads overlie the patient. Enlarged cardiac and mediastinal contours. Diffuse bilateral airspace opacities. Probable small left pleural effusion. No pneumothorax. IMPRESSION: 1. Cardiomegaly. 2. Diffuse bilateral airspace opacities most compatible with multifocal pneumonia. Electronically Signed   By: Annia Belt M.D.   On: 03/30/2020 12:51   DG Chest Port 1V same Day  Result Date: 04/18/2020 CLINICAL DATA:  Shortness of breath. EXAM: PORTABLE CHEST 1 VIEW COMPARISON:  April 07, 2020. FINDINGS: Stable cardiomediastinal silhouette. No pneumothorax is noted. Increased bilateral airspace opacities are noted concerning for worsening pneumonia, with possible left pleural effusion. Bony thorax is unremarkable. IMPRESSION: Increased bilateral airspace opacities are noted concerning for worsening pneumonia, with possible left pleural effusion. Aortic Atherosclerosis (ICD10-I70.0). Electronically Signed   By: Lupita Raider M.D.   On: 04/18/2020 08:26   VAS [place] LOWER EXTREMITY VENOUS (DVT)  Result Date: 04/02/2020  Lower Venous DVTStudy Other Indications: Covid positive with elevated d-dimer. Performing Technologist: Marilynne Halsted RDMS, RVT  Examination Guidelines: A complete evaluation includes B-mode imaging, spectral Doppler, color Doppler, and power Doppler as needed of all accessible  portions of each vessel. Bilateral testing is considered an integral part of a complete examination. Limited examinations for reoccurring indications may be performed as noted. The reflux portion of the exam is performed with the patient in reverse Trendelenburg.  +---------+---------------+---------+-----------+----------+--------------+ RIGHT    CompressibilityPhasicitySpontaneityPropertiesThrombus Aging +---------+---------------+---------+-----------+----------+--------------+ CFV      Full           Yes      Yes                                 +---------+---------------+---------+-----------+----------+--------------+ SFJ      Full                                                        +---------+---------------+---------+-----------+----------+--------------+  FV Prox  Full                                                        +---------+---------------+---------+-----------+----------+--------------+ FV Mid   Full                                                        +---------+---------------+---------+-----------+----------+--------------+ FV DistalFull                                                        +---------+---------------+---------+-----------+----------+--------------+ PFV      Full                                                        +---------+---------------+---------+-----------+----------+--------------+ POP      Full           Yes      Yes                                 +---------+---------------+---------+-----------+----------+--------------+ PTV      Full                                                        +---------+---------------+---------+-----------+----------+--------------+ PERO     Full                                                        +---------+---------------+---------+-----------+----------+--------------+   +---------+---------------+---------+-----------+----------+--------------+ LEFT      CompressibilityPhasicitySpontaneityPropertiesThrombus Aging +---------+---------------+---------+-----------+----------+--------------+ CFV      Full           Yes      Yes                                 +---------+---------------+---------+-----------+----------+--------------+ SFJ      Full                                                        +---------+---------------+---------+-----------+----------+--------------+ FV Prox  Full                                                        +---------+---------------+---------+-----------+----------+--------------+  FV Mid   Full                                                        +---------+---------------+---------+-----------+----------+--------------+ FV DistalFull                                                        +---------+---------------+---------+-----------+----------+--------------+ PFV      Full                                                        +---------+---------------+---------+-----------+----------+--------------+ POP      Full           Yes      Yes                                 +---------+---------------+---------+-----------+----------+--------------+ PTV      Full                                                        +---------+---------------+---------+-----------+----------+--------------+ PERO     Full                                                        +---------+---------------+---------+-----------+----------+--------------+     Summary: BILATERAL: - No evidence of deep vein thrombosis seen in the lower extremities, bilaterally. -No evidence of popliteal cyst, bilaterally.   *See table(s) above for measurements and observations. Electronically signed by Gretta Began MD on 04/02/2020 at 5:04:02 PM.    Final    ECHOCARDIOGRAM LIMITED  Result Date: 03/31/2020    ECHOCARDIOGRAM LIMITED REPORT   Patient Name:   YOUNG BRIM Date of Exam: 03/31/2020 Medical Rec  #:  [id number]     Height:       62.0 in Accession #:    [id number]    Weight:       142.2 lb Date of Birth:  1950/12/04     BSA:          1.654 m Patient Age:    68 years      BP:           136/80 mmHg Patient Gender: F             HR:           86 bpm. Exam Location:  Inpatient Procedure: Limited Echo, Color Doppler and Cardiac Doppler Indications:    R06.9 DOE  History:        Patient has no prior history of Echocardiogram examinations.  Risk Factors:Dyslipidemia.  Sonographer:    Irving Burton Senior RDCS Referring Phys: Humberto Seals  Sonographer Comments: COVID+ at time of study IMPRESSIONS  1. Abnormal septal motion with distal hypokinesis . Left ventricular ejection fraction, by estimation, is 50 to 55%. The left ventricle has low normal function. Left ventricular endocardial border not optimally defined to evaluate regional wall motion. Left ventricular diastolic parameters are indeterminate.  2. The mitral valve is normal in structure. Mild mitral valve regurgitation.  3. The aortic valve is tricuspid. Aortic valve regurgitation is not visualized. Mild aortic valve sclerosis is present, with no evidence of aortic valve stenosis.  4. There is normal pulmonary artery systolic pressure.  5. The inferior vena cava is normal in size with greater than 50% respiratory variability, suggesting right atrial pressure of 3 mmHg. FINDINGS  Left Ventricle: Abnormal septal motion with distal hypokinesis. Left ventricular ejection fraction, by estimation, is 50 to 55%. The left ventricle has low normal function. Left ventricular endocardial border not optimally defined to evaluate regional wall motion. The left ventricular internal cavity size was normal in size. Right Ventricle: There is normal pulmonary artery systolic pressure. The tricuspid regurgitant velocity is 2.33 m/s, and with an assumed right atrial pressure of 3 mmHg, the estimated right ventricular systolic pressure is 24.7 mmHg. Mitral Valve:  The mitral valve is normal in structure. There is mild thickening of the mitral valve leaflet(s). There is mild calcification of the mitral valve leaflet(s). Mild mitral annular calcification. Mild mitral valve regurgitation. Tricuspid Valve: The tricuspid valve is normal in structure. Tricuspid valve regurgitation is mild. Aortic Valve: The aortic valve is tricuspid. Aortic valve regurgitation is not visualized. Mild aortic valve sclerosis is present, with no evidence of aortic valve stenosis. Venous: The inferior vena cava is normal in size with greater than 50% respiratory variability, suggesting right atrial pressure of 3 mmHg. IAS/Shunts: The interatrial septum was not well visualized. RIGHT VENTRICLE RV S prime:     10.80 cm/s TAPSE (M-mode): 1.7 cm AORTIC VALVE LVOT Vmax:   95.30 cm/s LVOT Vmean:  65.900 cm/s LVOT VTI:    0.180 m MITRAL VALVE               TRICUSPID VALVE MV Area (PHT): 4.21 cm    TR Peak grad:   21.7 mmHg MV Decel Time: 180 msec    TR Vmax:        233.00 cm/s MV E velocity: 57.70 cm/s MV A velocity: 73.20 cm/s  SHUNTS MV E/A ratio:  0.79        Systemic VTI: 0.18 m Charlton Haws MD Electronically signed by Charlton Haws MD Signature Date/Time: 03/31/2020/3:13:28 PM    Final

## 2020-04-21 MED ORDER — LORAZEPAM 1 MG PO TABS: 1.0000 mg | ORAL_TABLET | ORAL | 0 refills | Status: DC | PRN

## 2020-04-21 MED ORDER — MORPHINE SULFATE (CONCENTRATE) 10 MG/0.5ML PO SOLN
5.0000 mg | Freq: Four times a day (QID) | ORAL | Status: DC
  Administered 2020-04-21 (×2): 5 mg via SUBLINGUAL
  Filled 2020-04-21 (×2): qty 0.5

## 2020-04-21 MED ORDER — ONDANSETRON 4 MG PO TBDP: 4.0000 mg | ORAL_TABLET | Freq: Four times a day (QID) | ORAL | 0 refills | Status: DC | PRN

## 2020-04-21 MED ORDER — LORAZEPAM 1 MG PO TABS: 1.0000 mg | ORAL_TABLET | ORAL | Status: DC | PRN

## 2020-04-21 MED ORDER — MORPHINE SULFATE (CONCENTRATE) 10 MG/0.5ML PO SOLN: 5.0000 mg | ORAL | 0 refills | Status: DC | PRN

## 2020-04-21 MED ORDER — FENTANYL 25 MCG/HR TD PT72
1.0000 | MEDICATED_PATCH | TRANSDERMAL | Status: DC
  Administered 2020-04-21: 1 via TRANSDERMAL
  Filled 2020-04-21: qty 1

## 2020-04-21 MED ORDER — MORPHINE SULFATE (CONCENTRATE) 10 MG/0.5ML PO SOLN: 10.0000 mg | ORAL | Status: DC | PRN

## 2020-04-21 NOTE — Care Management (Signed)
Case manager acknowledged order to speak with patient's daughter concerning HH/hospice at home. CM reviewed notes and spoke with patient's bedside RN concerning patient's current condition. Patient is now comfort care and will likely transition in house. TOC team will continue to monitor. Vance Peper, RN BSN Case Manager  [phone]

## 2020-04-21 NOTE — Progress Notes (Signed)
Daily Progress Note   Patient Name: Christina Russell       Date: 04/21/2020 DOB: 01/18/1951  Age: 69 y.o. MRN#: [id number] Attending Physician: Maretta Bees, MD Primary Care Physician: [name], MD Admit Date: 03/30/2020  Reason for Consultation/Follow-up: Establishing goals of care  Subjective/GOC: Patient awake, alert and conversing with daughter at bedside. She is asking to go home. She is on 15L NRB mask and appears comfortable on morphine infusion.   Daughter, Christina Russell at bedside. Christina Russell adamantly wishes to discharge her mother home today. She wishes for her to be surrounded by her family. Discussed concern with high oxygen requirement but because she is now only on 15L NRB (yesterday still on 15NRB and 15L HFNC), that I may be able to find a hospice agency that will supply higher oxygen requirement at home. Also discussed that she would not go home on morphine infusion. Christina Russell tells me she will give oral medications and she just wishes to get her mother home as soon as possible. They only live 10 minutes from [facility] and patient/daughter both willing to take the risk for transfer home. Reassured Christina Russell that I will advocate for her and her mother to try to get her home with hospice this afternoon.   Discussed symptom management medications including initiation of fentanyl patch to manage dyspnea. Daughter agreeable.   Discussed with RN and Dr. Jerral Ralph.   Spoke with [employer] liaison. They are unable to arrange equipment deliver today. Spoke with [facility] [address], [place] multiple times. They are able to arrange equipment delivery this afternoon and same day admit once patient returns home.   Updated RN CM, Darl Pikes, RN and Dr Jerral Ralph.    Length  of Stay: 22  Current Medications: Scheduled Meds:  . fentaNYL  1 patch Transdermal Q72H  . morphine CONCENTRATE  5 mg Sublingual Q6H  . sodium chloride flush  3 mL Intravenous Q12H    Continuous Infusions: . sodium chloride    . chlorproMAZINE (THORAZINE) IV    . morphine 2 mg/hr (04/20/20 1220)    PRN Meds: sodium chloride, atropine, chlorproMAZINE (THORAZINE) IV, glycopyrrolate, haloperidol **OR** haloperidol **OR** haloperidol lactate, LORazepam **OR** LORazepam **OR** LORazepam, LORazepam, morphine, morphine CONCENTRATE, ondansetron **OR** ondansetron (ZOFRAN) IV, sodium chloride flush  Physical Exam Vitals and nursing note reviewed.  Constitutional:  General: She is awake.     Appearance: She is cachectic. She is ill-appearing.  HENT:     Head: Normocephalic and atraumatic.  Cardiovascular:     Rate and Rhythm: Tachycardia present.  Pulmonary:     Effort: No tachypnea, accessory muscle usage or respiratory distress.     Breath sounds: Decreased breath sounds present.     Comments: 15L NRB Skin:    General: Skin is warm and dry.  Neurological:     Mental Status: She is alert.     Comments: Oriented to person/place. Asking to go home.          Vital Signs: BP 140/83 (BP Location: Right Leg)   Pulse (!) 148   Temp 98.9 F (37.2 C) (Axillary)   Resp 20   Ht 5\' 2"  (1.575 m)   Wt 55.7 kg   SpO2 91%   BMI 22.46 kg/m  SpO2: SpO2: 91 % O2 Device: O2 Device: High Flow Nasal Cannula, NRB O2 Flow Rate: O2 Flow Rate (L/min): 15 L/min  Intake/output summary:   Intake/Output Summary (Last 24 hours) at 04/21/2020 06/21/2020 Last data filed at 04/21/2020 06/21/2020 Gross per 24 hour  Intake 60 ml  Output --  Net 60 ml   LBM: Last BM Date: 04/15/20 Baseline Weight: Weight: 65.3 kg Most recent weight: Weight: 55.7 kg       Palliative Assessment/Data: PPS 20%    Flowsheet Rows     Most Recent Value  Intake Tab  Referral Department Critical care  Unit at Time of Referral  ICU  Palliative Care Primary Diagnosis Sepsis/Infectious Disease  Date Notified 04/01/20  Palliative Care Type New Palliative care  Reason for referral Clarify Goals of Care  Date of Admission 03/30/20  Date first seen by Palliative Care 04/06/20  # of days Palliative referral response time 5 Day(s)  # of days IP prior to Palliative referral 2  Clinical Assessment  Psychosocial & Spiritual Assessment  Palliative Care Outcomes      Patient Active Problem List   Diagnosis Date Noted  . Dyspnea   . Acute respiratory failure with hypoxia (HCC)   . Palliative care by specialist   . Acute respiratory failure due to COVID-19 (HCC) 03/30/2020  . Pneumonia due to COVID-19 virus 03/30/2020  . COVID-19 03/30/2020  . Dementia (HCC)   . Hyperlipidemia 12/05/2019  . Estrogen deficiency 10/20/2018  . Terminal care 10/20/2018  . Urinary frequency 11/30/2017  . Trigger middle finger of left hand 11/30/2017  . Seizures (HCC) 11/29/2017    Palliative Care Assessment & Plan   Patient Profile: 69 y.o. female  with past medical history of dementia and hyperlipidemia admitted on 03/30/2020 with shortness of breath. Hospital admission for acute hypoxic respiratory failure secondary to COVID-19 pneumonia and developed CHF. Not clinically progressing despite aggressive medical management. Remains on high flow 55L. Patient is a DNR/DNI. Palliative medicine consultation for goals of are.   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   Patient and daughter adamantly asking for discharge home as soon as possible. Live 10 minutes from home and willing to take the risk with transfer. Patient is awake and alert and hopefully will tolerate transfer home.   TOC order placed for home hospice. [facility] arranging oxygen delivery this  afternoon. They can arrange >10L home oxygen.    Symptom management  Fentanyl [place]  TD q72h   Roxanol 5mg  SL q6h   Roxanol 10-20mg  SL q2h prn breakthrough pain/dyspnea/air hunger  Ativan 1mg  PO q4h prn anxiety/agitation  Discontinue morphine infusion prior to transfer home.   Comfort feeds per patient/family request   Code Status: DNR   Code Status Orders  (From admission, onward)         Start     Ordered   04/03/20 0925  Do not attempt resuscitation (DNR)  Continuous       Question Answer Comment  In the event of cardiac or respiratory ARREST Do not call a "code blue"   In the event of cardiac or respiratory ARREST Do not perform Intubation, CPR, defibrillation or ACLS   In the event of cardiac or respiratory ARREST Use medication by any route, position, wound care, and other measures to relive pain and suffering. May use oxygen, suction and manual treatment of airway obstruction as needed for comfort.      04/03/20 0924        Code Status History    Date Active Date Inactive Code Status Order ID Comments User Context   03/30/2020 2248 04/03/2020 0924 Full Code 04/01/2020  Gleason, 04/05/2020 ED   03/30/2020 1445 03/30/2020 2248 Full Code 04/01/2020  [id number], MD ED   Advance Care Planning Activity       Prognosis:  Poor prognosis   Discharge Planning:   Home with hospice services  Care plan was discussed with Dr. [id number], RN, daughter, multiple conversations with RN CM and hospice liaison  Thank you for allowing the Palliative Medicine Team to assist in the care of this patient.   Total Time 60 Prolonged Time Billed no     Greater than 50% of this time was spent counseling and coordinating care related to the above assessment and plan.   Lucile Shutters, DNP, FNP-C Palliative Medicine Team  Phone: [phone] Fax: [phone]  Please contact Palliative Medicine Team phone at [phone] for questions and concerns.

## 2020-04-21 NOTE — Progress Notes (Signed)
[facility] [place] [facility] was contacted with referral for hospice at home and them needing to go home today. Michelle arranged for equipment to be delivered of hospital bed, 2-10 L concentrators for 15 L of oxygen. Hospital bed, OBT, suction, BSC, and nebulizer. [employer] has reported to [place] they can deliver it today. Once equipment in home the daughter Waynetta Sandy will call us at [phone] and we will reach out to CM Darl Pikes so that ambulance can be arranged. Due to this being a holiday we are asking for liquid morphine to go home with pt and ativan po per scripts so that we can keep pt comfortable in the home. (2 day supply) Thank you for this referral. Norm Parcel RN [phone]

## 2020-04-21 NOTE — Discharge Summary (Signed)
PATIENT DETAILS Name: Christina Russell Age: 69 y.o. Sex: female Date of Birth: November 21, 1950 MRN: [id number]. Admitting Physician: Staci Acosta, MD PCP:Sun, Gerlene Burdock, MD  Admit Date: 03/30/2020 Discharge date: 04/21/2020  Recommendations for Outpatient Follow-up:  1. Optimize comfort medications-being discharged at patient/daughter's request to home with hospice care.  Admitted From:  Home  Disposition: Home with hospice services   Home Health: No  Equipment/Devices: Home O2 for comfort.  Discharge Condition: Extremely poor-going home with hospice care  CODE STATUS: DNR/ Comfort Care  Diet recommendation:  Diet Order            DIET - DYS 1 Room service appropriate? No; Fluid consistency: Nectar Thick  Diet effective now                  Brief Narrative: Patient is a 69 y.o. female with PMHx of HTN, HLD, dementia-tested positive for COVID-19 on 8/12-presented to the ED on 8/15 with worsening shortness of breath-she was diagnosed with Covid 19 pneumonia and acute hypoxic respiratory failure-she was initially admitted to the ICU-and required heated high flow.  She was stabilized-and transferred to [facility] service on 8/18.  Hospital course complicated by persistent severe hypoxemia, aspiration pneumonia and features compatible with failure to thrive syndrome.  Patient deteriorated more on 9/5-and was subsequently transitioned to comfort measures.  COVID-19 vaccinated status: Unvaccinated  Significant Events: 8/15>> Admit for hypoxemia secondary to COVID-19 pneumonia 8/18>> transfer to [facility] from ICU 9/5>> deteriorating-transition to comfort measures 9/6>> daughter requesting discharge home with hospice care  Significant studies: 8/15>>Chest x-ray: Diffuse bilateral airspace opacities-compatible with multifocal pneumonia 8/16>> Echo: EF 50-55% 8/18>> bilateral lower extremity Doppler: No DVT. 8/18>> chest x-ray: Persistent pulmonary infiltrates consistent with  multifocal pneumonia 8/23>> chest x-ray: Areas of multifocal pneumonia with consolidation greatest in the left base region, small left pleural effusion  COVID-19 medications: Steroids: 8/15>> 8/31 Remdesivir: 8/15>> 8/19 Baricitinib: 8/16>> 8/28  Antibiotics: Unasyn: 8/19>> 8/25  Microbiology data: 8/15 >>blood culture: No growth  Procedures: None  Consults: None  Brief Hospital Course: Acute Hypoxic Respiratory Failure due to COVID-19 pneumonia: Had severe hypoxemia on admission-initially transferred to ICU-subsequently stabilized a bit but still had significant oxygen needs.  Subsequently transferred to the [facility] hospital[facility] service where she continued to have persistent hypoxemia requiring high flow oxygen.  She was treated with a course of steroids, Remdesivir and baricitinib.  She did have an episode of aspiration pneumonia-and required IV Unasyn.  Unfortunately even with maximal medical treatment-she continues to have persistent hypoxemia requiring HFNC-on 9/5 started getting more symptomatic-with air hunger, tachypnea, tiredness/fatigue.  After discussion with family-she was transitioned to comfort measures and started on a morphine infusion.  Overnight-with comfort measures in place-she has stabilized a bit-appears much more comfortable compared to just yesterday.  Daughter (Michelle)-at bedside-is requesting that patient be discharged home with hospice care.  She had multiple family members will provide 24/7 care.  Palliative care following-palliative NP has spoken with hospice services-with recommendations to discharge home with hospice care on Roxanol and Ativan.  COVID-19 Labs:  Recent Labs    04/19/20 0659 04/20/20 0140 04/20/20 0900  DDIMER 1.85*  --  1.70*  CRP 10.5* 12.8*  --     Lab Results  Component Value Date   SARSCOV2NAA POSITIVE (A) 03/30/2020     Aspiration pneumonia: Finished a course of Unasyn-SLP following-on dysphagia 1 diet.  Elevated  D-dimer: Secondary to covid 19-D-dimer-has stabilized and now is less than 2-lower extremity Dopplers negative on 8/18.  She was maintained on prophylactic Lovenox.  Acute on chronic diastolic heart failure: Seems reasonably compensated-she was given diuretic therapy during this hospital stay.  Dementia with delirium: Delirium continues to wax and wane-had overall improved but only to deteriorate more on 9/5 with she became more delirious.  She was treated with Seroquel.  Once she was transitioned to comfort care-Seroquel was discontinued.    HLD:  Statin has been discontinued as patient is on comfort measures.  Palliative care discussion: Now is in place.  Her prognosis was felt to be poor given persistent hypoxemia in a 69 year old female due to severe COVID-19 pneumonia.  Multiple discussions were held with family by numerous MDs-plan was to continue supportive care unless she deteriorates or becomes very symptomatic.  On 9/5-due to increasing symptoms-worsening hypoxemia (requiring NRB with 15 L of HFNC)-she was transitioned to full comfort measures.  On 9/6-she appeared very comfortable on morphine infusion-daughter Marcelino Duster at bedside is requesting that patient be discharged home with hospice care.  Palliative care has been following-recommendations are to go ahead and discharge patient home today-palliative care has consulted case management to deliver oxygen and other if needed equipments, patient will be discharged home on Roxanol and Ativan.  Further optimization of her comfort medication will be done by outpatient hospice.   Discharge Diagnoses:  Principal Problem:   Pneumonia due to COVID-19 virus Active Problems:   Terminal care   Acute respiratory failure due to COVID-19 (HCC)   Dementia (HCC)   COVID-19   Acute respiratory failure with hypoxia [facility])   Palliative care by specialist   Dyspnea   Discharge Instructions:    Person Under Monitoring Name: Christina Russell  Location: [address] [place] [address]   Infection Prevention Recommendations for Individuals Confirmed to have, or Being Evaluated for, 2019 Novel Coronavirus (COVID-19) Infection Who Receive Care at Home  Individuals who are confirmed to have, or are being evaluated for, COVID-19 should follow the prevention steps below until a healthcare provider or local or state health department says they can return to normal activities.  Stay home except to get medical care You should restrict activities outside your home, except for getting medical care. Do not go to work, school, or public areas, and do not use public transportation or taxis.  Call ahead before visiting your doctor Before your medical appointment, call the healthcare provider and tell them that you have, or are being evaluated for, COVID-19 infection. This will help the healthcare providers office take steps to keep other people from getting infected. Ask your healthcare provider to call the local or state health department.  Monitor your symptoms Seek prompt medical attention if your illness is worsening (e.g., difficulty breathing). Before going to your medical appointment, call the healthcare provider and tell them that you have, or are being evaluated for, COVID-19 infection. Ask your healthcare provider to call the local or state health department.  Wear a facemask You should wear a facemask that covers your nose and mouth when you are in the same room with other people and when you visit a healthcare provider. People who live with or visit you should also wear a facemask while they are in the same room with you.  Separate yourself from other people in your home As much as possible, you should stay in a different room from other people in your home. Also, you should use a separate bathroom, if available.  Avoid sharing household items You should not share  dishes, drinking glasses,  cups, eating utensils, towels, bedding, or other items with other people in your home. After using these items, you should wash them thoroughly with soap and water.  Cover your coughs and sneezes Cover your mouth and nose with a tissue when you cough or sneeze, or you can cough or sneeze into your sleeve. Throw used tissues in a lined trash can, and immediately wash your hands with soap and water for at least 20 seconds or use an alcohol-based hand rub.  Wash your [employer] your hands often and thoroughly with soap and water for at least 20 seconds. You can use an alcohol-based hand sanitizer if soap and water are not available and if your hands are not visibly dirty. Avoid touching your eyes, nose, and mouth with unwashed hands.   Prevention Steps for Caregivers and Household Members of Individuals Confirmed to have, or Being Evaluated for, COVID-19 Infection Being Cared for in the Home  If you live with, or provide care at home for, a person confirmed to have, or being evaluated for, COVID-19 infection please follow these guidelines to prevent infection:  Follow healthcare providers instructions Make sure that you understand and can help the patient follow any healthcare provider instructions for all care.  Provide for the patients basic needs You should help the patient with basic needs in the home and provide support for getting groceries, prescriptions, and other personal needs.  Monitor the patients symptoms If they are getting sicker, call his or her medical provider and tell them that the patient has, or is being evaluated for, COVID-19 infection. This will help the healthcare providers office take steps to keep other people from getting infected. Ask the healthcare provider to call the local or state health department.  Limit the number of people who have contact with the patient  If possible, have only one caregiver for the patient.  Other household members should  stay in another home or place of residence. If this is not possible, they should stay  in another room, or be separated from the patient as much as possible. Use a separate bathroom, if available.  Restrict visitors who do not have an essential need to be in the home.  Keep older adults, very young children, and other sick people away from the patient Keep older adults, very young children, and those who have compromised immune systems or chronic health conditions away from the patient. This includes people with chronic heart, lung, or kidney conditions, diabetes, and cancer.  Ensure good ventilation Make sure that shared spaces in the home have good air flow, such as from an air conditioner or an opened window, weather permitting.  Wash your hands often  Wash your hands often and thoroughly with soap and water for at least 20 seconds. You can use an alcohol based hand sanitizer if soap and water are not available and if your hands are not visibly dirty.  Avoid touching your eyes, nose, and mouth with unwashed hands.  Use disposable paper towels to dry your hands. If not available, use dedicated cloth towels and replace them when they become wet.  Wear a facemask and gloves  Wear a disposable facemask at all times in the room and gloves when you touch or have contact with the patients blood, body fluids, and/or secretions or excretions, such as sweat, saliva, sputum, nasal mucus, vomit, urine, or feces.  Ensure the mask fits over your nose and mouth tightly, and do not touch it during  use.  Throw out disposable facemasks and gloves after using them. Do not reuse.  Wash your hands immediately after removing your facemask and gloves.  If your personal clothing becomes contaminated, carefully remove clothing and launder. Wash your hands after handling contaminated clothing.  Place all used disposable facemasks, gloves, and other waste in a lined container before disposing them with other  household waste.  Remove gloves and wash your hands immediately after handling these items.  Do not share dishes, glasses, or other household items with the patient  Avoid sharing household items. You should not share dishes, drinking glasses, cups, eating utensils, towels, bedding, or other items with a patient who is confirmed to have, or being evaluated for, COVID-19 infection.  After the person uses these items, you should wash them thoroughly with soap and water.  Wash laundry thoroughly  Immediately remove and wash clothes or bedding that have blood, body fluids, and/or secretions or excretions, such as sweat, saliva, sputum, nasal mucus, vomit, urine, or feces, on them.  Wear gloves when handling laundry from the patient.  Read and follow directions on labels of laundry or clothing items and detergent. In general, wash and dry with the warmest temperatures recommended on the label.  Clean all areas the individual has used often  Clean all touchable surfaces, such as counters, tabletops, doorknobs, bathroom fixtures, toilets, phones, keyboards, tablets, and bedside tables, every day. Also, clean any surfaces that may have blood, body fluids, and/or secretions or excretions on them.  Wear gloves when cleaning surfaces the patient has come in contact with.  Use a diluted bleach solution (e.g., dilute bleach with 1 part bleach and 10 parts water) or a household disinfectant with a label that says EPA-registered for coronaviruses. To make a bleach solution at home, add 1 tablespoon of bleach to 1 quart (4 cups) of water. For a larger supply, add  cup of bleach to 1 gallon (16 cups) of water.  Read labels of cleaning products and follow recommendations provided on product labels. Labels contain instructions for safe and effective use of the cleaning product including precautions you should take when applying the product, such as wearing gloves or eye protection and making sure you have  good ventilation during use of the product.  Remove gloves and wash hands immediately after cleaning.  Monitor yourself for signs and symptoms of illness Caregivers and household members are considered close contacts, should monitor their health, and will be asked to limit movement outside of the home to the extent possible. Follow the monitoring steps for close contacts listed on the symptom monitoring form.   ? If you have additional questions, contact your local health department or call the epidemiologist on call at [phone] (available 24/7). ? This guidance is subject to change. For the most up-to-date guidance from [facility], please refer to their website: [employer]hu    Activity:  As tolerated with Full fall precautions use walker/cane & assistance as needed  Discharge Instructions    Call MD for:   Complete by: As directed    If anxiety, air hunger, shortness of breath, pain uncontrolled by morphine/Ativan.   Increase activity slowly   Complete by: As directed      Allergies as of 04/21/2020   No Known Allergies     Medication List    STOP taking these medications   atorvastatin 40 MG tablet Commonly known as: LIPITOR   dextromethorphan-guaiFENesin 30-600 MG 12hr tablet Commonly known as: MUCINEX DM     TAKE  these medications   LORazepam 1 MG tablet Commonly known as: ATIVAN Take 1 tablet (1 mg total) by mouth every 4 (four) hours as needed for anxiety.   morphine CONCENTRATE 10 MG/0.5ML Soln concentrated solution Place 0.25-0.5 mLs (5-10 mg total) under the tongue every 4 (four) hours as needed for severe pain.   ondansetron 4 MG disintegrating tablet Commonly known as: ZOFRAN-ODT Take 1 tablet (4 mg total) by mouth every 6 (six) hours as needed for nausea.       No Known Allergies    Other Procedures/Studies: DG CHEST PORT 1 VIEW  Result Date: 04/20/2020 CLINICAL DATA:  COVID positive  patient. Evaluate airspace lung opacities. EXAM: PORTABLE CHEST 1 VIEW COMPARISON:  04/18/2020 and earlier studies. FINDINGS: Bilateral airspace lung opacities are without change from the prior study. No new lung abnormalities. Cannot exclude a small pleural effusion, but no pleural effusion is definitively seen. No pneumothorax. IMPRESSION: 1. No interval change from the most recent prior study. Persistent bilateral airspace lung opacities consistent with multifocal COVID-19 pneumonia. No definite pleural effusion. Electronically Signed   By: Amie Portland M.D.   On: 04/20/2020 08:41   DG Chest Port 1 View  Result Date: 04/07/2020 CLINICAL DATA:  Shortness of breath.  Reported COVID-19 positive. EXAM: PORTABLE CHEST 1 VIEW COMPARISON:  April 03, 2020 FINDINGS: There is persistent airspace consolidation in the left base with small left pleural effusion. There is slightly less airspace consolidation in the right base compared to recent study. There is ill-defined opacity in the right mid lung, stable. More subtle ill-defined opacity is noted in the left mid lung. Heart size and pulmonary vascular normal. No adenopathy. There is aortic atherosclerosis. No bone lesions. IMPRESSION: Areas of multifocal pneumonia with consolidation greatest in the left base region. Small left pleural effusion. Suspect atypical organism pneumonia as most likely etiology. Superimposed bacterial pneumonia in the left base is possible. Stable cardiac silhouette. Aortic Atherosclerosis (ICD10-I70.0). Electronically Signed   By: Bretta Bang III M.D.   On: 04/07/2020 08:08   DG Chest Port 1 View  Result Date: 04/03/2020 CLINICAL DATA:  History of COVID infection EXAM: PORTABLE CHEST 1 VIEW COMPARISON:  April 02, 2020 FINDINGS: Images rotated to the LEFT. Accounting for this rotation cardiomediastinal contours are stable. Increasing opacity at the LEFT lung base compared to the prior study. Also with increased opacity at the  RIGHT lung base. Air bronchograms are seen at the LEFT lung base on today's study as well. Exam is more similar to the study of August 15. Probable small LEFT effusion as before. On limited assessment skeletal structures are unremarkable. IMPRESSION: Bilateral opacities worse at the lung bases and worse in the LEFT lung base, similar to prior imaging in this patient with reported history of COVID-19 pneumonia. Electronically Signed   By: Donzetta Kohut M.D.   On: 04/03/2020 07:54   DG Chest Port 1 View  Result Date: 04/02/2020 CLINICAL DATA:  Acute respiratory failure with hypoxemia, COVID-19 EXAM: PORTABLE CHEST 1 VIEW COMPARISON:  Portable exam 0652 hours compared 03/30/2020 FINDINGS: Normal heart size, mediastinal contours, and pulmonary vascularity. Atherosclerotic calcification aorta. Scattered pulmonary infiltrates bilaterally consistent with multifocal pneumonia most confluent in the LEFT lower lobe. No pleural effusion or pneumothorax. Mild osseous demineralization. IMPRESSION: Persistent pulmonary infiltrates consistent with multifocal pneumonia and history of COVID-19, slightly improved. Electronically Signed   By: Ulyses Southward M.D.   On: 04/02/2020 08:34   DG Chest Port 1 View  Result Date: 03/30/2020 CLINICAL DATA:  Patient with history of COVID-19. Worsening shortness of breath. EXAM: PORTABLE CHEST 1 VIEW COMPARISON:  None. FINDINGS: Monitoring leads overlie the patient. Enlarged cardiac and mediastinal contours. Diffuse bilateral airspace opacities. Probable small left pleural effusion. No pneumothorax. IMPRESSION: 1. Cardiomegaly. 2. Diffuse bilateral airspace opacities most compatible with multifocal pneumonia. Electronically Signed   By: Annia Belt M.D.   On: 03/30/2020 12:51   DG Chest Port 1V same Day  Result Date: 04/18/2020 CLINICAL DATA:  Shortness of breath. EXAM: PORTABLE CHEST 1 VIEW COMPARISON:  April 07, 2020. FINDINGS: Stable cardiomediastinal silhouette. No pneumothorax is  noted. Increased bilateral airspace opacities are noted concerning for worsening pneumonia, with possible left pleural effusion. Bony thorax is unremarkable. IMPRESSION: Increased bilateral airspace opacities are noted concerning for worsening pneumonia, with possible left pleural effusion. Aortic Atherosclerosis (ICD10-I70.0). Electronically Signed   By: Lupita Raider M.D.   On: 04/18/2020 08:26   VAS [place] LOWER EXTREMITY VENOUS (DVT)  Result Date: 04/02/2020  Lower Venous DVTStudy Other Indications: Covid positive with elevated d-dimer. Performing Technologist: Marilynne Halsted RDMS, RVT  Examination Guidelines: A complete evaluation includes B-mode imaging, spectral Doppler, color Doppler, and power Doppler as needed of all accessible portions of each vessel. Bilateral testing is considered an integral part of a complete examination. Limited examinations for reoccurring indications may be performed as noted. The reflux portion of the exam is performed with the patient in reverse Trendelenburg.  +---------+---------------+---------+-----------+----------+--------------+  RIGHT     Compressibility Phasicity Spontaneity Properties Thrombus Aging  +---------+---------------+---------+-----------+----------+--------------+  CFV       Full            Yes       Yes                                    +---------+---------------+---------+-----------+----------+--------------+  SFJ       Full                                                             +---------+---------------+---------+-----------+----------+--------------+  FV Prox   Full                                                             +---------+---------------+---------+-----------+----------+--------------+  FV Mid    Full                                                             +---------+---------------+---------+-----------+----------+--------------+  FV Distal Full                                                              +---------+---------------+---------+-----------+----------+--------------+  PFV  Full                                                             +---------+---------------+---------+-----------+----------+--------------+  POP       Full            Yes       Yes                                    +---------+---------------+---------+-----------+----------+--------------+  PTV       Full                                                             +---------+---------------+---------+-----------+----------+--------------+  PERO      Full                                                             +---------+---------------+---------+-----------+----------+--------------+   +---------+---------------+---------+-----------+----------+--------------+  LEFT      Compressibility Phasicity Spontaneity Properties Thrombus Aging  +---------+---------------+---------+-----------+----------+--------------+  CFV       Full            Yes       Yes                                    +---------+---------------+---------+-----------+----------+--------------+  SFJ       Full                                                             +---------+---------------+---------+-----------+----------+--------------+  FV Prox   Full                                                             +---------+---------------+---------+-----------+----------+--------------+  FV Mid    Full                                                             +---------+---------------+---------+-----------+----------+--------------+  FV Distal Full                                                             +---------+---------------+---------+-----------+----------+--------------+  PFV       Full                                                             +---------+---------------+---------+-----------+----------+--------------+  POP       Full            Yes       Yes                                     +---------+---------------+---------+-----------+----------+--------------+  PTV       Full                                                             +---------+---------------+---------+-----------+----------+--------------+  PERO      Full                                                             +---------+---------------+---------+-----------+----------+--------------+     Summary: BILATERAL: - No evidence of deep vein thrombosis seen in the lower extremities, bilaterally. -No evidence of popliteal cyst, bilaterally.   *See table(s) above for measurements and observations. Electronically signed by Gretta Began MD on 04/02/2020 at 5:04:02 PM.    Final    ECHOCARDIOGRAM LIMITED  Result Date: 03/31/2020    ECHOCARDIOGRAM LIMITED REPORT   Patient Name:   EVITA MERIDA Date of Exam: 03/31/2020 Medical Rec #:  [id number]     Height:       62.0 in Accession #:    [id number]    Weight:       142.2 lb Date of Birth:  02-27-1951     BSA:          1.654 m Patient Age:    68 years      BP:           136/80 mmHg Patient Gender: F             HR:           86 bpm. Exam Location:  Inpatient Procedure: Limited Echo, Color Doppler and Cardiac Doppler Indications:    R06.9 DOE  History:        Patient has no prior history of Echocardiogram examinations.                 Risk Factors:Dyslipidemia.  Sonographer:    Irving Burton Senior RDCS Referring Phys: Humberto Seals  Sonographer Comments: COVID+ at time of study IMPRESSIONS  1. Abnormal septal motion with distal hypokinesis . Left ventricular ejection fraction, by estimation, is 50 to 55%. The left ventricle has low normal function. Left ventricular endocardial border not optimally defined to evaluate regional wall motion. Left ventricular diastolic parameters are indeterminate.  2. The mitral valve is normal in structure. Mild mitral valve regurgitation.  3. The aortic valve is tricuspid. Aortic  valve regurgitation is not visualized. Mild aortic valve sclerosis is present,  with no evidence of aortic valve stenosis.  4. There is normal pulmonary artery systolic pressure.  5. The inferior vena cava is normal in size with greater than 50% respiratory variability, suggesting right atrial pressure of 3 mmHg. FINDINGS  Left Ventricle: Abnormal septal motion with distal hypokinesis. Left ventricular ejection fraction, by estimation, is 50 to 55%. The left ventricle has low normal function. Left ventricular endocardial border not optimally defined to evaluate regional wall motion. The left ventricular internal cavity size was normal in size. Right Ventricle: There is normal pulmonary artery systolic pressure. The tricuspid regurgitant velocity is 2.33 m/s, and with an assumed right atrial pressure of 3 mmHg, the estimated right ventricular systolic pressure is 24.7 mmHg. Mitral Valve: The mitral valve is normal in structure. There is mild thickening of the mitral valve leaflet(s). There is mild calcification of the mitral valve leaflet(s). Mild mitral annular calcification. Mild mitral valve regurgitation. Tricuspid Valve: The tricuspid valve is normal in structure. Tricuspid valve regurgitation is mild. Aortic Valve: The aortic valve is tricuspid. Aortic valve regurgitation is not visualized. Mild aortic valve sclerosis is present, with no evidence of aortic valve stenosis. Venous: The inferior vena cava is normal in size with greater than 50% respiratory variability, suggesting right atrial pressure of 3 mmHg. IAS/Shunts: The interatrial septum was not well visualized. RIGHT VENTRICLE RV S prime:     10.80 cm/s TAPSE (M-mode): 1.7 cm AORTIC VALVE LVOT Vmax:   95.30 cm/s LVOT Vmean:  65.900 cm/s LVOT VTI:    0.180 m MITRAL VALVE               TRICUSPID VALVE MV Area (PHT): 4.21 cm    TR Peak grad:   21.7 mmHg MV Decel Time: 180 msec    TR Vmax:        233.00 cm/s MV E velocity: 57.70 cm/s MV A velocity: 73.20 cm/s  SHUNTS MV E/A ratio:  0.79        Systemic VTI: 0.18 m Charlton Haws MD  Electronically signed by Charlton Haws MD Signature Date/Time: 03/31/2020/3:13:28 PM    Final      TODAY-DAY OF DISCHARGE:  Subjective:   Christina Russell today appears very comfortable on morphine infusion 2 mg of this morning.  Objective:   Blood pressure 140/83, pulse (!) 148, temperature 98.9 F (37.2 C), temperature source Axillary, resp. rate 20, height  (1.575 m), weight 55.7 kg, SpO2 91 %.  Intake/Output Summary (Last 24 hours) at 04/21/2020 1408 Last data filed at 04/21/2020 1300 Gross per 24 hour  Intake 110 ml  Output --  Net 110 ml   Filed Weights   04/15/20 0400 04/18/20 0500 04/19/20 0535  Weight: 53.6 kg 60 kg 55.7 kg    Exam: Appears comfortable-lethargic but somewhat more awake compared to yesterday. [place].AT,PERRAL Supple Neck,No JVD, No cervical lymphadenopathy appriciated.  Symmetrical Chest wall movement, Good air movement bilaterally, CTAB RRR,No Gallops,Rubs or new Murmurs, No Parasternal Heave +ve B.Sounds, Abd Soft, Non tender, No organomegaly appriciated, No rebound -guarding or rigidity. No Cyanosis, Clubbing or edema, No new Rash or bruise   PERTINENT RADIOLOGIC STUDIES: DG CHEST PORT 1 VIEW  Result Date: 04/20/2020 CLINICAL DATA:  COVID positive patient. Evaluate airspace lung opacities. EXAM: PORTABLE CHEST 1 VIEW COMPARISON:  04/18/2020 and earlier studies. FINDINGS: Bilateral airspace lung opacities are without change from the prior study. No new lung abnormalities. Cannot exclude a small pleural  effusion, but no pleural effusion is definitively seen. No pneumothorax. IMPRESSION: 1. No interval change from the most recent prior study. Persistent bilateral airspace lung opacities consistent with multifocal COVID-19 pneumonia. No definite pleural effusion. Electronically Signed   By: Amie Portland M.D.   On: 04/20/2020 08:41   DG Chest Port 1 View  Result Date: 04/07/2020 CLINICAL DATA:  Shortness of breath.  Reported COVID-19 positive. EXAM: PORTABLE  CHEST 1 VIEW COMPARISON:  April 03, 2020 FINDINGS: There is persistent airspace consolidation in the left base with small left pleural effusion. There is slightly less airspace consolidation in the right base compared to recent study. There is ill-defined opacity in the right mid lung, stable. More subtle ill-defined opacity is noted in the left mid lung. Heart size and pulmonary vascular normal. No adenopathy. There is aortic atherosclerosis. No bone lesions. IMPRESSION: Areas of multifocal pneumonia with consolidation greatest in the left base region. Small left pleural effusion. Suspect atypical organism pneumonia as most likely etiology. Superimposed bacterial pneumonia in the left base is possible. Stable cardiac silhouette. Aortic Atherosclerosis (ICD10-I70.0). Electronically Signed   By: Bretta Bang III M.D.   On: 04/07/2020 08:08   DG Chest Port 1 View  Result Date: 04/03/2020 CLINICAL DATA:  History of COVID infection EXAM: PORTABLE CHEST 1 VIEW COMPARISON:  April 02, 2020 FINDINGS: Images rotated to the LEFT. Accounting for this rotation cardiomediastinal contours are stable. Increasing opacity at the LEFT lung base compared to the prior study. Also with increased opacity at the RIGHT lung base. Air bronchograms are seen at the LEFT lung base on today's study as well. Exam is more similar to the study of August 15. Probable small LEFT effusion as before. On limited assessment skeletal structures are unremarkable. IMPRESSION: Bilateral opacities worse at the lung bases and worse in the LEFT lung base, similar to prior imaging in this patient with reported history of COVID-19 pneumonia. Electronically Signed   By: Donzetta Kohut M.D.   On: 04/03/2020 07:54   DG Chest Port 1 View  Result Date: 04/02/2020 CLINICAL DATA:  Acute respiratory failure with hypoxemia, COVID-19 EXAM: PORTABLE CHEST 1 VIEW COMPARISON:  Portable exam 0652 hours compared 03/30/2020 FINDINGS: Normal heart size,  mediastinal contours, and pulmonary vascularity. Atherosclerotic calcification aorta. Scattered pulmonary infiltrates bilaterally consistent with multifocal pneumonia most confluent in the LEFT lower lobe. No pleural effusion or pneumothorax. Mild osseous demineralization. IMPRESSION: Persistent pulmonary infiltrates consistent with multifocal pneumonia and history of COVID-19, slightly improved. Electronically Signed   By: Ulyses Southward M.D.   On: 04/02/2020 08:34   DG Chest Port 1 View  Result Date: 03/30/2020 CLINICAL DATA:  Patient with history of COVID-19. Worsening shortness of breath. EXAM: PORTABLE CHEST 1 VIEW COMPARISON:  None. FINDINGS: Monitoring leads overlie the patient. Enlarged cardiac and mediastinal contours. Diffuse bilateral airspace opacities. Probable small left pleural effusion. No pneumothorax. IMPRESSION: 1. Cardiomegaly. 2. Diffuse bilateral airspace opacities most compatible with multifocal pneumonia. Electronically Signed   By: Annia Belt M.D.   On: 03/30/2020 12:51   DG Chest Port 1V same Day  Result Date: 04/18/2020 CLINICAL DATA:  Shortness of breath. EXAM: PORTABLE CHEST 1 VIEW COMPARISON:  April 07, 2020. FINDINGS: Stable cardiomediastinal silhouette. No pneumothorax is noted. Increased bilateral airspace opacities are noted concerning for worsening pneumonia, with possible left pleural effusion. Bony thorax is unremarkable. IMPRESSION: Increased bilateral airspace opacities are noted concerning for worsening pneumonia, with possible left pleural effusion. Aortic Atherosclerosis (ICD10-I70.0). Electronically Signed   By: Fayrene Fearing  Christen Butter M.D.   On: 04/18/2020 08:26   VAS [place] LOWER EXTREMITY VENOUS (DVT)  Result Date: 04/02/2020  Lower Venous DVTStudy Other Indications: Covid positive with elevated d-dimer. Performing Technologist: Marilynne Halsted RDMS, RVT  Examination Guidelines: A complete evaluation includes B-mode imaging, spectral Doppler, color Doppler, and power  Doppler as needed of all accessible portions of each vessel. Bilateral testing is considered an integral part of a complete examination. Limited examinations for reoccurring indications may be performed as noted. The reflux portion of the exam is performed with the patient in reverse Trendelenburg.  +---------+---------------+---------+-----------+----------+--------------+  RIGHT     Compressibility Phasicity Spontaneity Properties Thrombus Aging  +---------+---------------+---------+-----------+----------+--------------+  CFV       Full            Yes       Yes                                    +---------+---------------+---------+-----------+----------+--------------+  SFJ       Full                                                             +---------+---------------+---------+-----------+----------+--------------+  FV Prox   Full                                                             +---------+---------------+---------+-----------+----------+--------------+  FV Mid    Full                                                             +---------+---------------+---------+-----------+----------+--------------+  FV Distal Full                                                             +---------+---------------+---------+-----------+----------+--------------+  PFV       Full                                                             +---------+---------------+---------+-----------+----------+--------------+  POP       Full            Yes       Yes                                    +---------+---------------+---------+-----------+----------+--------------+  PTV       Full                                                             +---------+---------------+---------+-----------+----------+--------------+  PERO      Full                                                             +---------+---------------+---------+-----------+----------+--------------+    +---------+---------------+---------+-----------+----------+--------------+  LEFT      Compressibility Phasicity Spontaneity Properties Thrombus Aging  +---------+---------------+---------+-----------+----------+--------------+  CFV       Full            Yes       Yes                                    +---------+---------------+---------+-----------+----------+--------------+  SFJ       Full                                                             +---------+---------------+---------+-----------+----------+--------------+  FV Prox   Full                                                             +---------+---------------+---------+-----------+----------+--------------+  FV Mid    Full                                                             +---------+---------------+---------+-----------+----------+--------------+  FV Distal Full                                                             +---------+---------------+---------+-----------+----------+--------------+  PFV       Full                                                             +---------+---------------+---------+-----------+----------+--------------+  POP       Full            Yes       Yes                                    +---------+---------------+---------+-----------+----------+--------------+  PTV       Full                                                             +---------+---------------+---------+-----------+----------+--------------+  PERO      Full                                                             +---------+---------------+---------+-----------+----------+--------------+     Summary: BILATERAL: - No evidence of deep vein thrombosis seen in the lower extremities, bilaterally. -No evidence of popliteal cyst, bilaterally.   *See table(s) above for measurements and observations. Electronically signed by Gretta Began MD on 04/02/2020 at 5:04:02 PM.    Final    ECHOCARDIOGRAM LIMITED  Result Date: 03/31/2020     ECHOCARDIOGRAM LIMITED REPORT   Patient Name:   YARISSA REINING Date of Exam: 03/31/2020 Medical Rec #:  [id number]     Height:       62.0 in Accession #:    [id number]    Weight:       142.2 lb Date of Birth:  31-May-1951     BSA:          1.654 m Patient Age:    68 years      BP:           136/80 mmHg Patient Gender: F             HR:           86 bpm. Exam Location:  Inpatient Procedure: Limited Echo, Color Doppler and Cardiac Doppler Indications:    R06.9 DOE  History:        Patient has no prior history of Echocardiogram examinations.                 Risk Factors:Dyslipidemia.  Sonographer:    Irving Burton Senior RDCS Referring Phys: Humberto Seals  Sonographer Comments: COVID+ at time of study IMPRESSIONS  1. Abnormal septal motion with distal hypokinesis . Left ventricular ejection fraction, by estimation, is 50 to 55%. The left ventricle has low normal function. Left ventricular endocardial border not optimally defined to evaluate regional wall motion. Left ventricular diastolic parameters are indeterminate.  2. The mitral valve is normal in structure. Mild mitral valve regurgitation.  3. The aortic valve is tricuspid. Aortic valve regurgitation is not visualized. Mild aortic valve sclerosis is present, with no evidence of aortic valve stenosis.  4. There is normal pulmonary artery systolic pressure.  5. The inferior vena cava is normal in size with greater than 50% respiratory variability, suggesting right atrial pressure of 3 mmHg. FINDINGS  Left Ventricle: Abnormal septal motion with distal hypokinesis. Left ventricular ejection fraction, by estimation, is 50 to 55%. The left ventricle has low normal function. Left ventricular endocardial border not optimally defined to evaluate regional wall motion. The left ventricular internal cavity size was normal in size. Right Ventricle: There is normal pulmonary artery systolic pressure. The tricuspid regurgitant velocity is 2.33 m/s, and with an assumed right atrial  pressure of 3 mmHg, the estimated right ventricular systolic pressure is 24.7 mmHg. Mitral Valve: The mitral valve is normal in structure. There is mild thickening of the mitral valve leaflet(s). There is mild calcification of the mitral valve leaflet(s). Mild mitral annular calcification. Mild mitral valve regurgitation. Tricuspid Valve: The tricuspid valve is normal in structure. Tricuspid valve regurgitation is mild. Aortic Valve: The aortic valve is tricuspid. Aortic valve regurgitation is not visualized. Mild aortic valve sclerosis is present, with  no evidence of aortic valve stenosis. Venous: The inferior vena cava is normal in size with greater than 50% respiratory variability, suggesting right atrial pressure of 3 mmHg. IAS/Shunts: The interatrial septum was not well visualized. RIGHT VENTRICLE RV S prime:     10.80 cm/s TAPSE (M-mode): 1.7 cm AORTIC VALVE LVOT Vmax:   95.30 cm/s LVOT Vmean:  65.900 cm/s LVOT VTI:    0.180 m MITRAL VALVE               TRICUSPID VALVE MV Area (PHT): 4.21 cm    TR Peak grad:   21.7 mmHg MV Decel Time: 180 msec    TR Vmax:        233.00 cm/s MV E velocity: 57.70 cm/s MV A velocity: 73.20 cm/s  SHUNTS MV E/A ratio:  0.79        Systemic VTI: 0.18 m Charlton Haws MD Electronically signed by Charlton Haws MD Signature Date/Time: 03/31/2020/3:13:28 PM    Final      PERTINENT LAB RESULTS: CBC: Recent Labs    04/19/20 0659 04/20/20 0140  WBC 8.7 11.6*  HGB 12.9 14.0  HCT 41.2 44.9  PLT 205 186   CMET CMP     Component Value Date/Time   NA 139 04/20/2020 0140   NA 144 12/05/2019 1357   K 4.5 04/20/2020 0140   CL 101 04/20/2020 0140   CO2 25 04/20/2020 0140   GLUCOSE 118 (H) 04/20/2020 0140   BUN 13 04/20/2020 0140   BUN 15 12/05/2019 1357   CREATININE 0.66 04/20/2020 0140   CALCIUM 8.7 (L) 04/20/2020 0140   PROT 6.7 04/20/2020 0140   PROT 7.5 04/15/2017 1716   ALBUMIN 2.3 (L) 04/20/2020 0140   ALBUMIN 4.4 04/15/2017 1716   AST 18 04/20/2020 0140   ALT  14 04/20/2020 0140   ALKPHOS 52 04/20/2020 0140   BILITOT 1.1 04/20/2020 0140   BILITOT 0.2 04/15/2017 1716   GFRNONAA >60 04/20/2020 0140   GFRAA >60 04/20/2020 0140    GFR Estimated Creatinine Clearance: 51.8 mL/min (by C-G formula based on SCr of 0.66 mg/dL). No results for input(s): LIPASE, AMYLASE in the last 72 hours. No results for input(s): CKTOTAL, CKMB, CKMBINDEX, TROPONINI in the last 72 hours. Invalid input(s): POCBNP Recent Labs    04/19/20 0659 04/20/20 0900  DDIMER 1.85* 1.70*   No results for input(s): HGBA1C in the last 72 hours. No results for input(s): CHOL, HDL, LDLCALC, TRIG, CHOLHDL, LDLDIRECT in the last 72 hours. No results for input(s): TSH, T4TOTAL, T3FREE, THYROIDAB in the last 72 hours.  Invalid input(s): FREET3 No results for input(s): VITAMINB12, FOLATE, FERRITIN, TIBC, IRON, RETICCTPCT in the last 72 hours. Coags: No results for input(s): INR in the last 72 hours.  Invalid input(s): PT Microbiology: No results found for this or any previous visit (from the past 240 hour(s)).  FURTHER DISCHARGE INSTRUCTIONS:  Get Medicines reviewed and adjusted: Please take all your medications with you for your next visit with your Primary MD  Laboratory/radiological data: Please request your Primary MD to go over all hospital tests and procedure/radiological results at the follow up, please ask your Primary MD to get all Hospital records sent to his/her office.  In some cases, they will be blood work, cultures and biopsy results pending at the time of your discharge. Please request that your primary care M.D. goes through all the records of your hospital data and follows up on these results.  Also Note the following: If you experience  worsening of your admission symptoms, develop shortness of breath, life threatening emergency, suicidal or homicidal thoughts you must seek medical attention immediately by calling 911 or calling your MD immediately  if symptoms  less severe.  You must read complete instructions/literature along with all the possible adverse reactions/side effects for all the Medicines you take and that have been prescribed to you. Take any new Medicines after you have completely understood and accpet all the possible adverse reactions/side effects.   Do not drive when taking Pain medications or sleeping medications (Benzodaizepines)  Do not take more than prescribed Pain, Sleep and Anxiety Medications. It is not advisable to combine anxiety,sleep and pain medications without talking with your primary care practitioner  Special Instructions: If you have smoked or chewed Tobacco  in the last 2 yrs please stop smoking, stop any regular Alcohol  and or any Recreational drug use.  Wear Seat belts while driving.  Please note: You were cared for by a hospitalist during your hospital stay. Once you are discharged, your primary care physician will handle any further medical issues. Please note that NO REFILLS for any discharge medications will be authorized once you are discharged, as it is imperative that you return to your primary care physician (or establish a relationship with a primary care physician if you do not have one) for your post hospital discharge needs so that they can reassess your need for medications and monitor your lab values.  Total Time spent coordinating discharge including counseling, education and face to face time equals 35 minutes.  Signed: Cova Knieriem 04/21/2020 2:08 PM

## 2020-04-21 NOTE — Care Management (Signed)
Case manager has spoken with [facility] with Palliative Care, patient's daughter wants her mom home. Aundra Millet is assisting with seeing if we can facilitate this today. [facility] is being contacted.  Vance Peper, RN BSN Case Managewr [phone]

## 2020-04-21 NOTE — TOC Transition Note (Addendum)
Transition of Care [facility]) - CM/SW Discharge Note   Patient Details  Name: Christina Russell MRN: [id number] Date of Birth: 1951/05/27  [facility] of [facility]) CM/SW Contact:  Durenda Guthrie, RN Phone Number: [phone] 04/21/2020, 2:19 PM   Clinical Narrative:  Case manager spoke with Aundra Millet, Palliative Care RN to arrange for patient's discharge home. Megan contacted [facility]. and they are able to accept patient today with RN coming out this evening. CM spoke with Norm Parcel RN with [facility] to confirm. We have both spoken to patient's daughter Waynetta Sandy and she is to contact Cheri when [employer] delivers oxygen and hospital bed to the home. Medical necessity form has been completed and DNR. Cheri will contact CM when she hears from patient's daughter Marcelino Duster and I will arrange [facility] transport home. Should patient discharge after hours, Bedside RN is to call [facility] to arrange for transport home [phone] until 5p then call [phone] option 3, to arrange transport.     Final next level of care: Home w Hospice Care Barriers to Discharge: Continued Medical Work up   Patient Goals and CMS Choice Patient states their goals for this hospitalization and ongoing recovery are:: patient's daughter wants mom home with family to transition   Choice offered to / list presented to : Adult Children  Discharge Placement                       Discharge Plan and Services   Discharge Planning Services: CM Consult            DME Arranged: Hospice Equipment Package Others, Oxygen, Hospital bed DME Agency: [employer]       HH Arranged: RN [facility] Agency: [facility] Date HH Agency Contacted: 04/21/20 Time HH Agency Contacted: 1030    Social Determinants of Health (SDOH) Interventions     Readmission Risk Interventions No flowsheet data found.

## 2020-04-28 ENCOUNTER — Other Ambulatory Visit: Payer: Self-pay | Admitting: *Deleted

## 2020-04-28 NOTE — Patient Outreach (Signed)
[facility]) Care Management  04/28/2020  Tamma Brigandi September 18, 1950 [id number]   RED ON EMMI ALERT - General Discharge Day # 4 Date: 9/12 Red Alert Reason: Not read discharge papers, Don't know who to call with changes in conditions, Not scheduled follow up appointment, and Feeling sad/anxious/empty/hopeless   EMMI referral received as member discharged home from hospital on 9/6.  Upon chart review, noted that member discharged home with hospice through [facility].  Outreach attempt #1, successful to daughter Marcelino Duster.  Identity verified.  This care manager introduced self and stated purpose of call.  [facility] care management services explained.    She confirms that member was discharged with hospice, services has started.  Denies any questions or concerns regarding hospice care but does have concerns/complaints regarding hospital stay for herself as well as her mother.  She has been in contact with the Patient Experience team, waiting their call back.  Provided with contact information for Patient Experience.  Denies any other questions/concerns.    Plan: RN CM will not open case at this time.  Will have EMMI calls stopped.  Kemper Durie, [place], MSN [facility] Care Management  [facility] Manager [phone]

## 2020-05-01 DIAGNOSIS — I499 Cardiac arrhythmia, unspecified: Secondary | ICD-10-CM | POA: Diagnosis not present

## 2020-05-01 DIAGNOSIS — I469 Cardiac arrest, cause unspecified: Secondary | ICD-10-CM | POA: Diagnosis not present

## 2020-05-01 DIAGNOSIS — R404 Transient alteration of awareness: Secondary | ICD-10-CM | POA: Diagnosis not present

## 2020-05-01 DIAGNOSIS — I4510 Unspecified right bundle-branch block: Secondary | ICD-10-CM | POA: Diagnosis not present

## 2020-05-16 DIAGNOSIS — 419620001 Death: Secondary | SNOMED CT | POA: Diagnosis not present

## 2020-05-16 DEATH — deceased
